# Patient Record
Sex: Male | Born: 1940 | Race: Black or African American | Hispanic: No | Marital: Single | Smoking: Former smoker
Health system: Southern US, Community
[De-identification: ages and names within clinical notes are randomized; demographics above are authoritative.]

## PROBLEM LIST (undated history)

## (undated) DIAGNOSIS — E538 Deficiency of other specified B group vitamins: Secondary | ICD-10-CM

## (undated) DIAGNOSIS — Z955 Presence of coronary angioplasty implant and graft: Secondary | ICD-10-CM

## (undated) DIAGNOSIS — K219 Gastro-esophageal reflux disease without esophagitis: Secondary | ICD-10-CM

## (undated) DIAGNOSIS — B1920 Unspecified viral hepatitis C without hepatic coma: Secondary | ICD-10-CM

## (undated) DIAGNOSIS — M109 Gout, unspecified: Secondary | ICD-10-CM

## (undated) DIAGNOSIS — I10 Essential (primary) hypertension: Secondary | ICD-10-CM

## (undated) DIAGNOSIS — K37 Unspecified appendicitis: Secondary | ICD-10-CM

## (undated) DIAGNOSIS — M1990 Unspecified osteoarthritis, unspecified site: Secondary | ICD-10-CM

## (undated) DIAGNOSIS — D649 Anemia, unspecified: Secondary | ICD-10-CM

## (undated) DIAGNOSIS — G479 Sleep disorder, unspecified: Secondary | ICD-10-CM

## (undated) DIAGNOSIS — H524 Presbyopia: Secondary | ICD-10-CM

## (undated) HISTORY — PX: OTHER SURGICAL HISTORY: SHX169

## (undated) HISTORY — PX: CHOLECYSTECTOMY: SHX55

## (undated) HISTORY — PX: CARDIAC CATHETERIZATION: SHX172

## (undated) HISTORY — PX: TOTAL KNEE ARTHROPLASTY: SHX125

## (undated) HISTORY — PX: APPENDECTOMY: SHX54

---

## 2003-02-25 ENCOUNTER — Emergency Department (HOSPITAL_COMMUNITY): Admission: AD | Admit: 2003-02-25 | Discharge: 2003-02-25 | Payer: Self-pay | Admitting: Family Medicine

## 2004-02-17 ENCOUNTER — Emergency Department (HOSPITAL_COMMUNITY): Admission: EM | Admit: 2004-02-17 | Discharge: 2004-02-17 | Payer: Self-pay | Admitting: Emergency Medicine

## 2005-02-08 ENCOUNTER — Emergency Department (HOSPITAL_COMMUNITY): Admission: EM | Admit: 2005-02-08 | Discharge: 2005-02-09 | Payer: Self-pay | Admitting: Emergency Medicine

## 2005-03-15 ENCOUNTER — Ambulatory Visit: Payer: Self-pay | Admitting: Gastroenterology

## 2005-04-04 ENCOUNTER — Ambulatory Visit: Payer: Self-pay | Admitting: Gastroenterology

## 2005-05-08 ENCOUNTER — Emergency Department (HOSPITAL_COMMUNITY): Admission: EM | Admit: 2005-05-08 | Discharge: 2005-05-09 | Payer: Self-pay | Admitting: *Deleted

## 2005-12-07 ENCOUNTER — Emergency Department (HOSPITAL_COMMUNITY): Admission: EM | Admit: 2005-12-07 | Discharge: 2005-12-07 | Payer: Self-pay | Admitting: Emergency Medicine

## 2007-04-08 ENCOUNTER — Encounter: Admission: RE | Admit: 2007-04-08 | Discharge: 2007-04-08 | Payer: Self-pay | Admitting: Family Medicine

## 2007-05-01 ENCOUNTER — Encounter: Admission: RE | Admit: 2007-05-01 | Discharge: 2007-06-19 | Payer: Self-pay | Admitting: Family Medicine

## 2008-07-17 ENCOUNTER — Emergency Department (HOSPITAL_COMMUNITY): Admission: EM | Admit: 2008-07-17 | Discharge: 2008-07-17 | Payer: Self-pay | Admitting: Emergency Medicine

## 2009-02-18 ENCOUNTER — Ambulatory Visit (HOSPITAL_COMMUNITY): Admission: RE | Admit: 2009-02-18 | Discharge: 2009-02-18 | Payer: Self-pay | Admitting: Chiropractic Medicine

## 2009-05-26 ENCOUNTER — Encounter: Admission: RE | Admit: 2009-05-26 | Discharge: 2009-05-26 | Payer: Self-pay | Admitting: Family Medicine

## 2010-03-22 ENCOUNTER — Emergency Department (HOSPITAL_COMMUNITY)
Admission: EM | Admit: 2010-03-22 | Discharge: 2010-03-22 | Payer: Self-pay | Source: Home / Self Care | Admitting: Emergency Medicine

## 2010-06-27 LAB — POCT CARDIAC MARKERS
CKMB, poc: 2.3 ng/mL (ref 1.0–8.0)
CKMB, poc: 2.4 ng/mL (ref 1.0–8.0)
Myoglobin, poc: 112 ng/mL (ref 12–200)
Myoglobin, poc: 114 ng/mL (ref 12–200)
Troponin i, poc: <0.05 ng/mL (ref 0.00–0.09)
Troponin i, poc: <0.05 ng/mL (ref 0.00–0.09)

## 2010-06-27 LAB — BASIC METABOLIC PANEL
BUN: 11 mg/dL (ref 6–23)
CO2: 27 mEq/L (ref 19–32)
Calcium: 9.2 mg/dL (ref 8.4–10.5)
Chloride: 107 mEq/L (ref 96–112)
Creatinine, Ser: 0.79 mg/dL (ref 0.4–1.5)
GFR calc Af Amer: >60 mL/min (ref >60)
GFR calc non Af Amer: >60 mL/min (ref >60)
Glucose, Bld: 110 mg/dL — ABNORMAL HIGH (ref 70–99)
Potassium: 3.8 mEq/L (ref 3.5–5.1)
Sodium: 141 mEq/L (ref 135–145)

## 2010-06-27 LAB — DIFFERENTIAL
Basophils Absolute: 0 10*3/uL (ref 0.0–0.1)
Basophils Relative: 1 % (ref 0–1)
Eosinophils Absolute: 0.1 10*3/uL (ref 0.0–0.7)
Eosinophils Relative: 1 % (ref 0–5)
Lymphocytes Relative: 46 % (ref 12–46)
Lymphs Abs: 1.7 10*3/uL (ref 0.7–4.0)
Monocytes Absolute: 0.5 10*3/uL (ref 0.1–1.0)
Monocytes Relative: 15 % — ABNORMAL HIGH (ref 3–12)
Neutro Abs: 1.3 10*3/uL — ABNORMAL LOW (ref 1.7–7.7)
Neutrophils Relative %: 37 % — ABNORMAL LOW (ref 43–77)

## 2010-06-27 LAB — PROTIME-INR
INR: 1.1 (ref 0.00–1.49)
Prothrombin Time: 14.4 seconds (ref 11.6–15.2)

## 2010-06-27 LAB — CBC
HCT: 36.8 % — ABNORMAL LOW (ref 39.0–52.0)
Hemoglobin: 13.2 g/dL (ref 13.0–17.0)
MCH: 32.8 pg (ref 26.0–34.0)
MCHC: 35.9 g/dL (ref 30.0–36.0)
MCV: 91.3 fL (ref 78.0–100.0)
Platelets: 245 10*3/uL (ref 150–400)
RBC: 4.03 MIL/uL — ABNORMAL LOW (ref 4.22–5.81)
RDW: 12.6 % (ref 11.5–15.5)
WBC: 3.6 10*3/uL — ABNORMAL LOW (ref 4.0–10.5)

## 2010-11-11 ENCOUNTER — Ambulatory Visit
Admission: RE | Admit: 2010-11-11 | Discharge: 2010-11-11 | Disposition: A | Discharge status: Home / Self Care | Payer: Medicare Other | Source: Ambulatory Visit | Attending: Family Medicine | Admitting: Family Medicine

## 2010-11-11 ENCOUNTER — Other Ambulatory Visit: Payer: Self-pay | Admitting: Family Medicine

## 2010-11-11 DIAGNOSIS — Z0000 Encounter for general adult medical examination without abnormal findings: Secondary | ICD-10-CM

## 2010-11-22 ENCOUNTER — Inpatient Hospital Stay (HOSPITAL_COMMUNITY): Admission: RE | Admit: 2010-11-22 | Payer: Medicare Other | Source: Ambulatory Visit | Admitting: Orthopedic Surgery

## 2011-09-15 ENCOUNTER — Encounter (HOSPITAL_COMMUNITY): Payer: Self-pay | Admitting: *Deleted

## 2011-09-15 ENCOUNTER — Emergency Department (HOSPITAL_COMMUNITY)
Admission: EM | Admit: 2011-09-15 | Discharge: 2011-09-15 | Disposition: A | Discharge status: Home / Self Care | Payer: Medicare Other | Attending: Emergency Medicine | Admitting: Emergency Medicine

## 2011-09-15 ENCOUNTER — Emergency Department (HOSPITAL_COMMUNITY): Payer: Medicare Other

## 2011-09-15 DIAGNOSIS — M25569 Pain in unspecified knee: Secondary | ICD-10-CM | POA: Insufficient documentation

## 2011-09-15 DIAGNOSIS — Z79899 Other long term (current) drug therapy: Secondary | ICD-10-CM | POA: Insufficient documentation

## 2011-09-15 DIAGNOSIS — Z87891 Personal history of nicotine dependence: Secondary | ICD-10-CM | POA: Insufficient documentation

## 2011-09-15 DIAGNOSIS — I10 Essential (primary) hypertension: Secondary | ICD-10-CM | POA: Insufficient documentation

## 2011-09-15 DIAGNOSIS — Z8739 Personal history of other diseases of the musculoskeletal system and connective tissue: Secondary | ICD-10-CM | POA: Insufficient documentation

## 2011-09-15 DIAGNOSIS — G8918 Other acute postprocedural pain: Secondary | ICD-10-CM | POA: Insufficient documentation

## 2011-09-15 HISTORY — DX: Presence of coronary angioplasty implant and graft: Z95.5

## 2011-09-15 HISTORY — DX: Unspecified appendicitis: K37

## 2011-09-15 HISTORY — DX: Essential (primary) hypertension: I10

## 2011-09-15 HISTORY — DX: Unspecified osteoarthritis, unspecified site: M19.90

## 2011-09-15 LAB — DIFFERENTIAL
Basophils Absolute: 0 10*3/uL (ref 0.0–0.1)
Basophils Relative: 0 % (ref 0–1)
Eosinophils Relative: 2 % (ref 0–5)
Lymphocytes Relative: 43 % (ref 12–46)
Lymphs Abs: 2.9 10*3/uL (ref 0.7–4.0)
Monocytes Absolute: 0.9 10*3/uL (ref 0.1–1.0)
Monocytes Relative: 13 % — ABNORMAL HIGH (ref 3–12)
Neutro Abs: 2.8 10*3/uL (ref 1.7–7.7)
Neutrophils Relative %: 42 % — ABNORMAL LOW (ref 43–77)

## 2011-09-15 LAB — CBC
HCT: 35.7 % — ABNORMAL LOW (ref 39.0–52.0)
Hemoglobin: 12.2 g/dL — ABNORMAL LOW (ref 13.0–17.0)
MCH: 31 pg (ref 26.0–34.0)
MCV: 90.8 fL (ref 78.0–100.0)
Platelets: 363 10*3/uL (ref 150–400)
RBC: 3.93 MIL/uL — ABNORMAL LOW (ref 4.22–5.81)
WBC: 6.8 10*3/uL (ref 4.0–10.5)

## 2011-09-15 NOTE — ED Provider Notes (Addendum)
History     CSN: [id number]  Arrival date & time 09/15/11  1600   First MD Initiated Contact with Patient 09/15/11 1845      Chief Complaint  Patient presents with  . Knee Pain    (Consider location/radiation/quality/duration/timing/severity/associated sxs/prior treatment) Patient is a 71 y.o. male presenting with knee pain. The history is provided by the patient.  Knee Pain   patient here with left knee pain after he has had recent surgery. Was discharged from the hospital week ago after having a total left knee replacement. Since that time, left knee remains painful but he has not had any fever or increased swelling or redness to his knee. Denies any myalgias or chills. Was seen at the [facility] clinic for this and does have a scheduled appointment next week. they told him to come here however for a recheck. No other complaints of  Past Medical History  Diagnosis Date  . Hypertension   . Arthritis   . History of heart artery stent   . Appendicitis     Past Surgical History  Procedure Date  . Total knee arthroplasty   . Right shoulder surgery   . Left shoulder surgery   . Appendectomy     History reviewed. No pertinent family history.  History  Substance Use Topics  . Smoking status: Former Smoker    Quit date: 09/14/2001  . Smokeless tobacco: Never Used  . Alcohol Use: 0.6 oz/week    1 Cans of beer per week      Review of Systems  All other systems reviewed and are negative.    Allergies  Review of patient's allergies indicates no known allergies.  Home Medications   Current Outpatient Rx  Name Route Sig Dispense Refill  . ALLOPURINOL 100 MG PO TABS Oral Take 100 mg by mouth daily.    Marland Kitchen AMLODIPINE BESY-BENAZEPRIL HCL 10-20 MG PO CAPS Oral Take 1 capsule by mouth daily.    Marland Kitchen LOSARTAN POTASSIUM 100 MG PO TABS Oral Take 100 mg by mouth daily.    Marland Kitchen METOPROLOL SUCCINATE ER 100 MG PO TB24 Oral Take 100 mg by mouth daily. Take with or immediately following a meal.     . SIMVASTATIN 10 MG PO TABS Oral Take 10 mg by mouth at bedtime.      BP 147/71  Pulse 85  Temp(Src) 98.3 F (36.8 C) (Oral)  Resp 18  Ht 5\' 11"  (1.803 m)  Wt 161 lb (73.029 kg)  BMI 22.45 kg/m2  SpO2 100%  Physical Exam  Nursing note and vitals reviewed. Constitutional: He is oriented to person, place, and time. He appears well-developed and well-nourished.  Non-toxic appearance. No distress.  HENT:  Head: Normocephalic and atraumatic.  Eyes: Conjunctivae, EOM and lids are normal. Pupils are equal, round, and reactive to light.  Neck: Normal range of motion. Neck supple. No tracheal deviation present. No mass present.  Cardiovascular: Normal rate, regular rhythm and normal heart sounds.  Exam reveals no gallop.   No murmur heard. Pulmonary/Chest: Effort normal and breath sounds normal. No stridor. No respiratory distress. He has no decreased breath sounds. He has no wheezes. He has no rhonchi. He has no rales.  Abdominal: Soft. Normal appearance and bowel sounds are normal. He exhibits no distension. There is no tenderness. There is no rebound and no CVA tenderness.  Musculoskeletal: Normal range of motion. He exhibits no edema and no tenderness.       Left knee with warmness to touch and  no erythema to the skin. Incision is intact without purulent drainage. He has full range of motion at the knee. No large effusion appreciated.  Neurological: He is alert and oriented to person, place, and time. He has normal strength. No cranial nerve deficit or sensory deficit. GCS eye subscore is 4. GCS verbal subscore is 5. GCS motor subscore is 6.  Skin: Skin is warm and dry. No abrasion and no rash noted.  Psychiatric: He has a normal mood and affect. His speech is normal and behavior is normal.    ED Course  Procedures (including critical care time)  Labs Reviewed - No data to display No results found.   No diagnosis found.    MDM  No concern for septic joint. Suspect that  patient's status postop changes. He has full range of motion at the knee. He is afebrile. Patient also states that the appearance of his knee has not changed from a week ago when he was discharged from the hospital. His white count is normal. He is scheduled to see his surgeon on Monday he was given return instructions for infection-        Toy Baker, MD 09/15/11 2103  Toy Baker, MD 09/15/11 2104

## 2011-09-15 NOTE — ED Notes (Signed)
Lower left knee is swollen and painful to touch. Surgical site is clean with no drainage. Reports no trauma. Right knee had surgery was 2008 and left knee had surgery many times before, but the last one was May 29th.

## 2011-09-15 NOTE — Discharge Instructions (Signed)
Followup with your doctor Monday as your schedule. Return here at once for fever, increased swelling to her knee, redness to the skin, or any other problems

## 2011-09-15 NOTE — ED Notes (Signed)
Total RT knee replacement Aug 24, 2011. Has had pain &swelling since but has been getting worse.  Told to go the the [facility] hospital in [place] or [place] but didn't have a way there so was told to go to the nearest ED for eval.

## 2011-09-15 NOTE — ED Notes (Signed)
Pt states he had left knee replacement on May 9th, states he went to see PCP for recheck and was told to come to ER due to increased pain and swelling to left knee and to r/o infection

## 2012-12-23 ENCOUNTER — Emergency Department: Payer: Self-pay | Admitting: Emergency Medicine

## 2012-12-23 LAB — CBC
HCT: 37.2 % — ABNORMAL LOW (ref 40.0–52.0)
MCH: 32.9 pg (ref 26.0–34.0)
MCHC: 34.7 g/dL (ref 32.0–36.0)
Platelet: 217 10*3/uL (ref 150–440)
RBC: 3.92 10*6/uL — ABNORMAL LOW (ref 4.40–5.90)

## 2012-12-23 LAB — BASIC METABOLIC PANEL
Anion Gap: 3 — ABNORMAL LOW (ref 7–16)
Calcium, Total: 8.9 mg/dL (ref 8.5–10.1)
Chloride: 107 mmol/L (ref 98–107)
Co2: 29 mmol/L (ref 21–32)
EGFR (African American): >60
EGFR (Non-African Amer.): >60
Osmolality: 280 (ref 275–301)
Potassium: 3.8 mmol/L (ref 3.5–5.1)

## 2013-05-12 ENCOUNTER — Encounter (HOSPITAL_COMMUNITY): Payer: Self-pay | Admitting: Emergency Medicine

## 2013-05-12 ENCOUNTER — Observation Stay (HOSPITAL_COMMUNITY)
Admission: EM | Admit: 2013-05-12 | Discharge: 2013-05-15 | Disposition: A | Discharge status: Home / Self Care | Payer: Medicare Other | Attending: Internal Medicine | Admitting: Internal Medicine

## 2013-05-12 ENCOUNTER — Emergency Department (HOSPITAL_COMMUNITY): Payer: Medicare Other

## 2013-05-12 DIAGNOSIS — Z96659 Presence of unspecified artificial knee joint: Secondary | ICD-10-CM | POA: Insufficient documentation

## 2013-05-12 DIAGNOSIS — R079 Chest pain, unspecified: Secondary | ICD-10-CM | POA: Diagnosis present

## 2013-05-12 DIAGNOSIS — Z79899 Other long term (current) drug therapy: Secondary | ICD-10-CM | POA: Insufficient documentation

## 2013-05-12 DIAGNOSIS — R5381 Other malaise: Secondary | ICD-10-CM | POA: Insufficient documentation

## 2013-05-12 DIAGNOSIS — B1920 Unspecified viral hepatitis C without hepatic coma: Secondary | ICD-10-CM | POA: Diagnosis present

## 2013-05-12 DIAGNOSIS — R5383 Other fatigue: Secondary | ICD-10-CM

## 2013-05-12 DIAGNOSIS — R740 Nonspecific elevation of levels of transaminase and lactic acid dehydrogenase [LDH]: Secondary | ICD-10-CM

## 2013-05-12 DIAGNOSIS — R7401 Elevation of levels of liver transaminase levels: Secondary | ICD-10-CM | POA: Diagnosis present

## 2013-05-12 DIAGNOSIS — R0602 Shortness of breath: Secondary | ICD-10-CM | POA: Insufficient documentation

## 2013-05-12 DIAGNOSIS — R0789 Other chest pain: Principal | ICD-10-CM | POA: Insufficient documentation

## 2013-05-12 DIAGNOSIS — R1013 Epigastric pain: Secondary | ICD-10-CM | POA: Diagnosis present

## 2013-05-12 DIAGNOSIS — E538 Deficiency of other specified B group vitamins: Secondary | ICD-10-CM | POA: Insufficient documentation

## 2013-05-12 DIAGNOSIS — D649 Anemia, unspecified: Secondary | ICD-10-CM | POA: Insufficient documentation

## 2013-05-12 DIAGNOSIS — Z9089 Acquired absence of other organs: Secondary | ICD-10-CM | POA: Insufficient documentation

## 2013-05-12 DIAGNOSIS — Z87891 Personal history of nicotine dependence: Secondary | ICD-10-CM | POA: Insufficient documentation

## 2013-05-12 DIAGNOSIS — R0601 Orthopnea: Secondary | ICD-10-CM | POA: Insufficient documentation

## 2013-05-12 DIAGNOSIS — I2510 Atherosclerotic heart disease of native coronary artery without angina pectoris: Secondary | ICD-10-CM | POA: Insufficient documentation

## 2013-05-12 DIAGNOSIS — R7402 Elevation of levels of lactic acid dehydrogenase (LDH): Secondary | ICD-10-CM | POA: Insufficient documentation

## 2013-05-12 DIAGNOSIS — R7309 Other abnormal glucose: Secondary | ICD-10-CM | POA: Insufficient documentation

## 2013-05-12 DIAGNOSIS — K219 Gastro-esophageal reflux disease without esophagitis: Secondary | ICD-10-CM | POA: Insufficient documentation

## 2013-05-12 DIAGNOSIS — K8020 Calculus of gallbladder without cholecystitis without obstruction: Secondary | ICD-10-CM | POA: Diagnosis present

## 2013-05-12 DIAGNOSIS — M129 Arthropathy, unspecified: Secondary | ICD-10-CM | POA: Insufficient documentation

## 2013-05-12 DIAGNOSIS — K859 Acute pancreatitis without necrosis or infection, unspecified: Secondary | ICD-10-CM | POA: Diagnosis present

## 2013-05-12 DIAGNOSIS — M109 Gout, unspecified: Secondary | ICD-10-CM | POA: Insufficient documentation

## 2013-05-12 DIAGNOSIS — I10 Essential (primary) hypertension: Secondary | ICD-10-CM | POA: Diagnosis present

## 2013-05-12 HISTORY — DX: Sleep disorder, unspecified: G47.9

## 2013-05-12 HISTORY — DX: Gastro-esophageal reflux disease without esophagitis: K21.9

## 2013-05-12 HISTORY — DX: Deficiency of other specified B group vitamins: E53.8

## 2013-05-12 HISTORY — DX: Unspecified viral hepatitis C without hepatic coma: B19.20

## 2013-05-12 HISTORY — DX: Presbyopia: H52.4

## 2013-05-12 HISTORY — DX: Anemia, unspecified: D64.9

## 2013-05-12 LAB — RETICULOCYTES
RBC.: 3.96 MIL/uL — ABNORMAL LOW (ref 4.22–5.81)
RETIC COUNT ABSOLUTE: 59.4 10*3/uL (ref 19.0–186.0)
Retic Ct Pct: 1.5 % (ref 0.4–3.1)

## 2013-05-12 LAB — BASIC METABOLIC PANEL
BUN: 13 mg/dL (ref 6–23)
CALCIUM: 9.3 mg/dL (ref 8.4–10.5)
CO2: 27 meq/L (ref 19–32)
CREATININE: 0.81 mg/dL (ref 0.50–1.35)
Chloride: 104 mEq/L (ref 96–112)
GFR calc Af Amer: >90 mL/min (ref >90)
GFR, EST NON AFRICAN AMERICAN: 87 mL/min — AB (ref >90)
Glucose, Bld: 119 mg/dL — ABNORMAL HIGH (ref 70–99)
Potassium: 4.1 mEq/L (ref 3.7–5.3)
SODIUM: 141 meq/L (ref 137–147)

## 2013-05-12 LAB — D-DIMER, QUANTITATIVE (NOT AT ARMC): D DIMER QUANT: 0.55 ug{FEU}/mL — AB (ref 0.00–0.48)

## 2013-05-12 LAB — MAGNESIUM: MAGNESIUM: 2.1 mg/dL (ref 1.5–2.5)

## 2013-05-12 LAB — HEPATIC FUNCTION PANEL
ALK PHOS: 110 U/L (ref 39–117)
ALT: 47 U/L (ref 0–53)
AST: 56 U/L — ABNORMAL HIGH (ref 0–37)
Albumin: 3.5 g/dL (ref 3.5–5.2)
Bilirubin, Direct: <0.2 mg/dL (ref 0.0–0.3)
TOTAL PROTEIN: 8.2 g/dL (ref 6.0–8.3)
Total Bilirubin: 0.5 mg/dL (ref 0.3–1.2)

## 2013-05-12 LAB — PROTIME-INR
INR: 1.06 (ref 0.00–1.49)
Prothrombin Time: 13.6 seconds (ref 11.6–15.2)

## 2013-05-12 LAB — CBC
HCT: 36.7 % — ABNORMAL LOW (ref 39.0–52.0)
HEMOGLOBIN: 12.9 g/dL — AB (ref 13.0–17.0)
MCH: 32.6 pg (ref 26.0–34.0)
MCHC: 35.1 g/dL (ref 30.0–36.0)
MCV: 92.7 fL (ref 78.0–100.0)
PLATELETS: 214 10*3/uL (ref 150–400)
RBC: 3.96 MIL/uL — ABNORMAL LOW (ref 4.22–5.81)
RDW: 13.8 % (ref 11.5–15.5)
WBC: 4.4 10*3/uL (ref 4.0–10.5)

## 2013-05-12 LAB — POCT I-STAT TROPONIN I: TROPONIN I, POC: 0.01 ng/mL (ref 0.00–0.08)

## 2013-05-12 LAB — CBC WITH DIFFERENTIAL/PLATELET
BASOS ABS: 0 10*3/uL (ref 0.0–0.1)
BASOS PCT: 1 % (ref 0–1)
EOS ABS: 0.1 10*3/uL (ref 0.0–0.7)
Eosinophils Relative: 2 % (ref 0–5)
HEMATOCRIT: 36.8 % — AB (ref 39.0–52.0)
Hemoglobin: 12.7 g/dL — ABNORMAL LOW (ref 13.0–17.0)
LYMPHS PCT: 49 % — AB (ref 12–46)
Lymphs Abs: 2.2 10*3/uL (ref 0.7–4.0)
MCH: 31.8 pg (ref 26.0–34.0)
MCHC: 34.5 g/dL (ref 30.0–36.0)
MCV: 92.2 fL (ref 78.0–100.0)
MONO ABS: 0.6 10*3/uL (ref 0.1–1.0)
Monocytes Relative: 13 % — ABNORMAL HIGH (ref 3–12)
Neutro Abs: 1.6 10*3/uL — ABNORMAL LOW (ref 1.7–7.7)
Neutrophils Relative %: 35 % — ABNORMAL LOW (ref 43–77)
PLATELETS: 215 10*3/uL (ref 150–400)
RBC: 3.99 MIL/uL — ABNORMAL LOW (ref 4.22–5.81)
RDW: 13.7 % (ref 11.5–15.5)
WBC: 4.5 10*3/uL (ref 4.0–10.5)

## 2013-05-12 LAB — TROPONIN I: Troponin I: <0.3 ng/mL (ref <0.30)

## 2013-05-12 LAB — LIPASE, BLOOD: LIPASE: 34 U/L (ref 11–59)

## 2013-05-12 MED ORDER — ACETAMINOPHEN 650 MG RE SUPP: 650.0000 mg | Freq: Four times a day (QID) | RECTAL | Status: DC | PRN

## 2013-05-12 MED ORDER — ONDANSETRON HCL 4 MG PO TABS: 4.0000 mg | ORAL_TABLET | Freq: Four times a day (QID) | ORAL | Status: DC | PRN

## 2013-05-12 MED ORDER — ACETAMINOPHEN 325 MG PO TABS: 650.0000 mg | ORAL_TABLET | Freq: Four times a day (QID) | ORAL | Status: DC | PRN

## 2013-05-12 MED ORDER — SODIUM CHLORIDE 0.9 % IJ SOLN: 3.0000 mL | INTRAMUSCULAR | Status: DC | PRN

## 2013-05-12 MED ORDER — SODIUM CHLORIDE 0.9 % IJ SOLN
3.0000 mL | Freq: Two times a day (BID) | INTRAMUSCULAR | Status: DC
  Administered 2013-05-15: 3 mL via INTRAVENOUS

## 2013-05-12 MED ORDER — SERTRALINE HCL 25 MG PO TABS
25.0000 mg | ORAL_TABLET | Freq: Every day | ORAL | Status: DC
  Administered 2013-05-12 – 2013-05-15 (×4): 25 mg via ORAL
  Filled 2013-05-12 (×4): qty 1

## 2013-05-12 MED ORDER — NITROGLYCERIN 0.4 MG SL SUBL: 0.4000 mg | SUBLINGUAL_TABLET | SUBLINGUAL | Status: DC | PRN

## 2013-05-12 MED ORDER — ASPIRIN EC 325 MG PO TBEC
325.0000 mg | DELAYED_RELEASE_TABLET | Freq: Every day | ORAL | Status: DC
  Administered 2013-05-12 – 2013-05-15 (×4): 325 mg via ORAL
  Filled 2013-05-12 (×4): qty 1

## 2013-05-12 MED ORDER — SODIUM CHLORIDE 0.9 % IJ SOLN
3.0000 mL | Freq: Two times a day (BID) | INTRAMUSCULAR | Status: DC
  Administered 2013-05-12 – 2013-05-14 (×5): 3 mL via INTRAVENOUS

## 2013-05-12 MED ORDER — METOPROLOL SUCCINATE ER 100 MG PO TB24
100.0000 mg | ORAL_TABLET | Freq: Two times a day (BID) | ORAL | Status: DC
  Administered 2013-05-12 – 2013-05-15 (×3): 100 mg via ORAL
  Filled 2013-05-12 (×7): qty 1

## 2013-05-12 MED ORDER — AMLODIPINE BESY-BENAZEPRIL HCL 10-20 MG PO CAPS: 1.0000 | ORAL_CAPSULE | Freq: Every day | ORAL | Status: DC

## 2013-05-12 MED ORDER — SODIUM CHLORIDE 0.9 % IV SOLN: 250.0000 mL | INTRAVENOUS | Status: DC | PRN

## 2013-05-12 MED ORDER — LEVALBUTEROL HCL 0.63 MG/3ML IN NEBU: 0.6300 mg | INHALATION_SOLUTION | Freq: Four times a day (QID) | RESPIRATORY_TRACT | Status: DC | PRN

## 2013-05-12 MED ORDER — ONDANSETRON HCL 4 MG/2ML IJ SOLN: 4.0000 mg | Freq: Four times a day (QID) | INTRAMUSCULAR | Status: DC | PRN

## 2013-05-12 MED ORDER — ASPIRIN 81 MG PO CHEW
324.0000 mg | CHEWABLE_TABLET | Freq: Once | ORAL | Status: AC
  Administered 2013-05-12: 324 mg via ORAL
  Filled 2013-05-12: qty 4

## 2013-05-12 MED ORDER — ALLOPURINOL 100 MG PO TABS
100.0000 mg | ORAL_TABLET | Freq: Every day | ORAL | Status: DC
  Administered 2013-05-12 – 2013-05-15 (×4): 100 mg via ORAL
  Filled 2013-05-12 (×4): qty 1

## 2013-05-12 MED ORDER — PANTOPRAZOLE SODIUM 40 MG PO TBEC
40.0000 mg | DELAYED_RELEASE_TABLET | Freq: Every day | ORAL | Status: DC
  Administered 2013-05-12 – 2013-05-15 (×4): 40 mg via ORAL
  Filled 2013-05-12 (×4): qty 1

## 2013-05-12 MED ORDER — AMLODIPINE BESYLATE 10 MG PO TABS
10.0000 mg | ORAL_TABLET | Freq: Every day | ORAL | Status: DC
  Administered 2013-05-12 – 2013-05-15 (×4): 10 mg via ORAL
  Filled 2013-05-12 (×4): qty 1

## 2013-05-12 MED ORDER — BENAZEPRIL HCL 20 MG PO TABS
20.0000 mg | ORAL_TABLET | Freq: Every day | ORAL | Status: DC
  Administered 2013-05-12 – 2013-05-15 (×4): 20 mg via ORAL
  Filled 2013-05-12 (×4): qty 1

## 2013-05-12 MED ORDER — ENOXAPARIN SODIUM 40 MG/0.4ML ~~LOC~~ SOLN
40.0000 mg | SUBCUTANEOUS | Status: DC
  Administered 2013-05-12 – 2013-05-14 (×3): 40 mg via SUBCUTANEOUS
  Filled 2013-05-12 (×4): qty 0.4

## 2013-05-12 NOTE — H&P (Signed)
[facility] History and Physical  ISAIC SYLER ZOX:[id number] DOB: 1941/03/23 DOA: 05/12/2013  Referring physician:   PCP: Burtis Junes, MD  Chest pain  HPI:  73 year old male with a history of coronary artery disease, status post stent placement in October 2010, with his cardiologist at the [facility], who presents with left-sided chest pain, started 2 days ago, worsened with movement, denies any correlation to deep inspiration. He denies any recent history of travel, any history of DVT/PE, does complain of mild shortness of breath orthopnea. He has not had a stress test since his stent placement, and doses compliance with aspirin, sometimes misses a few doses here and there. No history of prior myocardial infarction.       Review of Systems: negative for the following  Constitutional: As in history of present illness HEENT: Denies photophobia, eye pain, redness, hearing loss, ear pain, congestion, sore throat, rhinorrhea, sneezing, mouth sores, trouble swallowing, neck pain, neck stiffness and tinnitus.  Respiratory: Denies SOB, DOE, cough, chest tightness, and wheezing.  Cardiovascular: Positive for chest pain, palpitations and leg swelling.  Gastrointestinal: Denies nausea, vomiting, abdominal pain, diarrhea, constipation, blood in stool and abdominal distention.  Genitourinary: Denies dysuria, urgency, frequency, hematuria, flank pain and difficulty urinating.  Musculoskeletal: Denies myalgias, back pain, joint swelling, arthralgias and gait problem.  Skin: Denies pallor, rash and wound.  Neurological: Denies dizziness, seizures, syncope, weakness, light-headedness, numbness and headaches.  Hematological: Denies adenopathy. Easy bruising, personal or family bleeding history  Psychiatric/Behavioral: Denies suicidal ideation, mood changes, confusion, nervousness, sleep disturbance and agitation       Past Medical History  Diagnosis Date  . Hypertension   .  Arthritis   . History of heart artery stent   . Appendicitis      Past Surgical History  Procedure Laterality Date  . Total knee arthroplasty    . Right shoulder surgery    . Left shoulder surgery    . Appendectomy        Social History:  reports that he quit smoking about 11 years ago. He has never used smokeless tobacco. He reports that he drinks about 0.6 ounces of alcohol per week. He reports that he does not use illicit drugs.    No Known Allergies  No family history on file.   Prior to Admission medications   Medication Sig Start Date End Date Taking? Authorizing Provider  ACETAMINOPHEN PO Take 1 tablet by mouth as needed (pain.).    Yes Historical Provider, MD  allopurinol (ZYLOPRIM) 100 MG tablet Take 100 mg by mouth daily.   Yes Historical Provider, MD  amLODipine-benazepril (LOTREL) 10-20 MG per capsule Take 1 capsule by mouth daily.   Yes Historical Provider, MD  ATORVASTATIN CALCIUM PO Take 1 tablet by mouth at bedtime.   Yes Historical Provider, MD  GABAPENTIN PO Take 1 capsule by mouth 2 (two) times daily.    Yes Historical Provider, MD  losartan (COZAAR) 100 MG tablet Take 100 mg by mouth daily.   Yes Historical Provider, MD  metoprolol succinate (TOPROL-XL) 100 MG 24 hr tablet Take 100 mg by mouth 2 (two) times daily. Take with or immediately following a meal.   Yes Historical Provider, MD  NAPROXEN PO Take 1 tablet by mouth daily as needed (pain.).    Yes Historical Provider, MD  SERTRALINE HCL PO Take by mouth daily.    Yes Historical Provider, MD  vitamin B-12 (CYANOCOBALAMIN) 1000 MCG tablet Take 1,000 mcg by mouth daily.   Yes  Historical Provider, MD     Physical Exam: Filed Vitals:   05/12/13 1420 05/12/13 1731  BP: 106/62 128/72  Pulse: 56 56  Temp: 97.9 F (36.6 C)   TempSrc: Oral   Resp: 16 16  SpO2: 98% 98%     Constitutional: Vital signs reviewed. Patient is a well-developed and well-nourished in no acute distress and cooperative with  exam. Alert and oriented x3.  Head: Normocephalic and atraumatic  Ear: TM normal bilaterally  Mouth: no erythema or exudates, MMM  Eyes: PERRL, EOMI, conjunctivae normal, No scleral icterus.  Neck: Supple, Trachea midline normal ROM, No JVD, mass, thyromegaly, or carotid bruit present.  Cardiovascular: RRR, S1 normal, S2 normal, no MRG, pulses symmetric and intact bilaterally  Pulmonary/Chest: CTAB, no wheezes, rales, or rhonchi  Abdominal: Soft. Non-tender, non-distended, bowel sounds are normal, no masses, organomegaly, or guarding present.  GU: no CVA tenderness Musculoskeletal: No joint deformities, erythema, or stiffness, ROM full and no nontender Ext: no edema and no cyanosis, pulses palpable bilaterally (DP and PT)  Hematology: no cervical, inginal, or axillary adenopathy.  Neurological: A&O x3, Strenght is normal and symmetric bilaterally, cranial nerve II-XII are grossly intact, no focal motor deficit, sensory intact to light touch bilaterally.  Skin: Warm, dry and intact. No rash, cyanosis, or clubbing.  Psychiatric: Normal mood and affect. speech and behavior is normal. Judgment and thought content normal. Cognition and memory are normal.       Labs on Admission:    Basic Metabolic Panel:  Recent Labs Lab 05/12/13 1433  NA 141  K 4.1  CL 104  CO2 27  GLUCOSE 119*  BUN 13  CREATININE 0.81  CALCIUM 9.3   Liver Function Tests: No results found for this basename: AST, ALT, ALKPHOS, BILITOT, PROT, ALBUMIN,  in the last 168 hours No results found for this basename: LIPASE, AMYLASE,  in the last 168 hours No results found for this basename: AMMONIA,  in the last 168 hours CBC:  Recent Labs Lab 05/12/13 1433  WBC 4.5  NEUTROABS 1.6*  HGB 12.7*  HCT 36.8*  MCV 92.2  PLT 215   Cardiac Enzymes: No results found for this basename: CKTOTAL, CKMB, CKMBINDEX, TROPONINI,  in the last 168 hours  BNP (last 3 results) No results found for this basename: PROBNP,  in  the last 8760 hours    CBG: No results found for this basename: GLUCAP,  in the last 168 hours  Radiological Exams on Admission: No results found.  EKG: Independently reviewed. Sinus rhythm   Assessment/Plan Active Problems:   Chest pain   Chest pain Chest x-ray pending He had similar symptoms in 2011, workup was negative He will be admitted to telemetry We'll cycle cardiac enzymes, aspirin, oxygen, nitro paste 2-D echo, d-dimer, if abnormal the patient will have a CTA of the chest If workup negative, patient will be discharged home in a.m. He needs to follow up with his cardiologist at the [facility] to get an outpatient stress test   Anemia We'll start the patient on a PPI On chronic aspirin He will need age  appropriate screening after discharge   Hypertension Continue beta blocker, ACE inhibitor Hold Cozaar and blood pressure soft    Code Status:   full Family Communication: bedside Disposition Plan: Observation  Time spent: 70 mins   [facility] Pager [phone]  If 7PM-7AM, please contact night-coverage [email or web address] Password [facility] [place] 05/12/2013, 5:53 PM

## 2013-05-12 NOTE — ED Notes (Signed)
Pt c/o increased chest pain with movement and deep breathing

## 2013-05-12 NOTE — ED Notes (Signed)
Pt c/o chest pain off and on since Thursday; shortness of breath at times

## 2013-05-12 NOTE — ED Provider Notes (Signed)
Medical screening examination/treatment/procedure(s) were conducted as a shared visit with non-physician practitioner(s) and myself.  I personally evaluated the patient during the encounter.  EKG Interpretation    Date/Time:  Monday May 12 2013 14:17:43 EST Ventricular Rate:  55 PR Interval:  126 QRS Duration: 73 QT Interval:  422 QTC Calculation: 404 R Axis:   76 Text Interpretation:  Sinus rhythm Left atrial enlargement LVH with secondary repolarization abnormality Anterior Q waves, possibly due to LVH Confirmed by Ethelda Chick  MD, SAM ([id number]) on 05/12/2013 2:30:07 PM            Pt is a 73 y.o. M with history of hypertension, CAD status post stent who presents to the emergency department with intermittent chest pain shortness of breath. He is hemodynamically stable. Troponin is negative. EKG shows new lateral T-wave flattening and slight inversions. Will admit.  Layla Maw Demarie Hyneman, DO 05/12/13 [phone]

## 2013-05-12 NOTE — ED Provider Notes (Signed)
CSN: [id number]     Arrival date & time 05/12/13  1404 History   First MD Initiated Contact with Patient 05/12/13 1656     Chief Complaint  Patient presents with  . Chest Pain   (Consider location/radiation/quality/duration/timing/severity/associated sxs/prior Treatment) HPI Comments: Patient is a 73 year old male past medical history significant for hypertension, arthritis, former tobacco user, coronary artery stent placement 4 years ago presented to the emergency department for 2 days of left-sided chest pain without radiation. Patient states his pain is worsened with movement, inspiration. He denies any alleviating factors, although he has not tried any medications or symptomatic measures. He denies any history of similar chest pain. He denies any associated nausea, vomiting, diaphoresis, abdominal pain, cough, shortness of breath.  Patient is a 73 y.o. male presenting with chest pain.  Chest Pain Associated symptoms: no abdominal pain, no cough, no fever, no nausea, no shortness of breath and not vomiting     Past Medical History  Diagnosis Date  . Hypertension   . Arthritis   . History of heart artery stent   . Appendicitis    Past Surgical History  Procedure Laterality Date  . Total knee arthroplasty    . Right shoulder surgery    . Left shoulder surgery    . Appendectomy     History reviewed. No pertinent family history. History  Substance Use Topics  . Smoking status: Former Smoker    Quit date: 09/14/2001  . Smokeless tobacco: Never Used  . Alcohol Use: 0.0 oz/week     Comment: approx 2 beers two to three times per week    Review of Systems  Constitutional: Negative for fever and chills.  Respiratory: Negative for cough and shortness of breath.   Cardiovascular: Positive for chest pain.  Gastrointestinal: Negative for nausea, vomiting and abdominal pain.  All other systems reviewed and are negative.    Allergies  Review of patient's allergies indicates no  known allergies.  Home Medications   No current outpatient prescriptions on file. BP 150/75  Pulse 62  Temp(Src) 98 F (36.7 C) (Oral)  Resp 20  Ht 5\' 11"  (1.803 m)  Wt 165 lb (74.844 kg)  BMI 23.02 kg/m2  SpO2 99% Physical Exam  Constitutional: He is oriented to person, place, and time. He appears well-developed and well-nourished. No distress.  HENT:  Head: Normocephalic and atraumatic.  Right Ear: External ear normal.  Left Ear: External ear normal.  Nose: Nose normal.  Mouth/Throat: No oropharyngeal exudate.  Eyes: Conjunctivae are normal.  Neck: Neck supple.  Cardiovascular: Normal rate, regular rhythm and normal heart sounds.   No carotid bruits appreciated.  Pulmonary/Chest: Effort normal and breath sounds normal. No stridor. No respiratory distress. He exhibits no tenderness.  Abdominal: Soft.  Neurological: He is alert and oriented to person, place, and time.  Skin: Skin is warm and dry. He is not diaphoretic.    ED Course  Procedures (including critical care time) Medications  enoxaparin (LOVENOX) injection 40 mg (40 mg Subcutaneous Given 05/12/13 2108)  sodium chloride 0.9 % injection 3 mL (0 mLs Intravenous Duplicate 05/12/13 2045)  sodium chloride 0.9 % injection 3 mL (not administered)  sodium chloride 0.9 % injection 3 mL (not administered)  0.9 %  sodium chloride infusion (not administered)  acetaminophen (TYLENOL) tablet 650 mg (not administered)    Or  acetaminophen (TYLENOL) suppository 650 mg (not administered)  ondansetron (ZOFRAN) tablet 4 mg (not administered)    Or  ondansetron (ZOFRAN) injection 4 mg (  not administered)  aspirin EC tablet 325 mg (325 mg Oral Given 05/12/13 2108)  levalbuterol (XOPENEX) nebulizer solution 0.63 mg (not administered)  allopurinol (ZYLOPRIM) tablet 100 mg (100 mg Oral Given 05/12/13 2108)  metoprolol succinate (TOPROL-XL) 24 hr tablet 100 mg (100 mg Oral Given 05/12/13 2108)  sertraline (ZOLOFT) tablet 25 mg (25 mg  Oral Given 05/12/13 1819)  amLODipine (NORVASC) tablet 10 mg (10 mg Oral Given 05/12/13 2108)    And  benazepril (LOTENSIN) tablet 20 mg (20 mg Oral Given 05/12/13 2108)  nitroGLYCERIN (NITROSTAT) SL tablet 0.4 mg (not administered)  pantoprazole (PROTONIX) EC tablet 40 mg (40 mg Oral Given 05/12/13 1819)  aspirin chewable tablet 324 mg (324 mg Oral Given 05/12/13 1728)    Labs Review Labs Reviewed  CBC WITH DIFFERENTIAL - Abnormal; Notable for the following:    RBC 3.99 (*)    Hemoglobin 12.7 (*)    HCT 36.8 (*)    Neutrophils Relative % 35 (*)    Neutro Abs 1.6 (*)    Lymphocytes Relative 49 (*)    Monocytes Relative 13 (*)    All other components within normal limits  BASIC METABOLIC PANEL - Abnormal; Notable for the following:    Glucose, Bld 119 (*)    GFR calc non Af Amer 87 (*)    All other components within normal limits  CBC - Abnormal; Notable for the following:    RBC 3.96 (*)    Hemoglobin 12.9 (*)    HCT 36.7 (*)    All other components within normal limits  HEPATIC FUNCTION PANEL - Abnormal; Notable for the following:    AST 56 (*)    All other components within normal limits  D-DIMER, QUANTITATIVE - Abnormal; Notable for the following:    D-Dimer, Quant 0.55 (*)    All other components within normal limits  RETICULOCYTES - Abnormal; Notable for the following:    RBC. 3.96 (*)    All other components within normal limits  MAGNESIUM  PROTIME-INR  TROPONIN I  LIPASE, BLOOD  TSH  LIPID PANEL  HEMOGLOBIN A1C  TROPONIN I  TROPONIN I  COMPREHENSIVE METABOLIC PANEL  CBC  VITAMIN B12  FOLATE  IRON AND TIBC  FERRITIN  POCT I-STAT TROPONIN I   Imaging Review Dg Chest 2 View  05/12/2013   CLINICAL DATA:  Chest pain.  EXAM: CHEST  2 VIEW  COMPARISON:  PA and lateral chest 11/11/2010.  FINDINGS: The lungs are clear. Heart size is normal. No pneumothorax or pleural fluid. Postoperative change right shoulder is noted.  IMPRESSION: No acute disease.   Electronically  Signed   By: Drusilla Kanner M.D.   On: 05/12/2013 18:21    EKG Interpretation    Date/Time:  Monday May 12 2013 14:17:43 EST Ventricular Rate:  55 PR Interval:  126 QRS Duration: 73 QT Interval:  422 QTC Calculation: 404 R Axis:   76 Text Interpretation:  Sinus rhythm Left atrial enlargement LVH with secondary repolarization abnormality Anterior Q waves, possibly due to LVH Confirmed by [email or web address]  MD, SAM ([id number]) on 05/12/2013 2:30:07 PM            MDM   1. Chest pain     Filed Vitals:   05/12/13 1905  BP: 150/75  Pulse: 62  Temp: 98 F (36.7 C)  Resp: 20    Afebrile, NAD, non-toxic appearing, AAOx4.   Concern for cardiac etiology of Chest Pain. Hospitalist has been consulted and will see patient in  the ED for likely admit. Pt does not meet criteria for CP protocol and a further evaluation is recommended. Pt has been re-evaluated prior to consult and VSS, NAD, heart RRR, pain 0/10, lungs CTAB. No acute abnormalities found on EKG and first round of cardiac enzymes negative. This case was discussed with Dr. Elesa Massed who has seen the patient and agrees with plan to admit.     Jeannetta Ellis, PA-C 05/12/13 2330

## 2013-05-12 NOTE — ED Notes (Signed)
Bed: UP10 Expected date:  Expected time:  Means of arrival:  Comments: Room 3

## 2013-05-13 ENCOUNTER — Observation Stay (HOSPITAL_COMMUNITY): Payer: Medicare Other

## 2013-05-13 ENCOUNTER — Encounter (HOSPITAL_COMMUNITY): Payer: Self-pay | Admitting: Radiology

## 2013-05-13 DIAGNOSIS — I10 Essential (primary) hypertension: Secondary | ICD-10-CM

## 2013-05-13 DIAGNOSIS — R7401 Elevation of levels of liver transaminase levels: Secondary | ICD-10-CM | POA: Diagnosis present

## 2013-05-13 DIAGNOSIS — R7402 Elevation of levels of lactic acid dehydrogenase (LDH): Secondary | ICD-10-CM

## 2013-05-13 DIAGNOSIS — R740 Nonspecific elevation of levels of transaminase and lactic acid dehydrogenase [LDH]: Secondary | ICD-10-CM

## 2013-05-13 DIAGNOSIS — I369 Nonrheumatic tricuspid valve disorder, unspecified: Secondary | ICD-10-CM

## 2013-05-13 DIAGNOSIS — K859 Acute pancreatitis without necrosis or infection, unspecified: Secondary | ICD-10-CM

## 2013-05-13 LAB — COMPREHENSIVE METABOLIC PANEL
ALT: 41 U/L (ref 0–53)
AST: 50 U/L — AB (ref 0–37)
Albumin: 3.1 g/dL — ABNORMAL LOW (ref 3.5–5.2)
Alkaline Phosphatase: 101 U/L (ref 39–117)
BUN: 11 mg/dL (ref 6–23)
CALCIUM: 9 mg/dL (ref 8.4–10.5)
CO2: 29 meq/L (ref 19–32)
Chloride: 105 mEq/L (ref 96–112)
Creatinine, Ser: 0.8 mg/dL (ref 0.50–1.35)
GFR, EST NON AFRICAN AMERICAN: 87 mL/min — AB (ref >90)
GLUCOSE: 116 mg/dL — AB (ref 70–99)
Potassium: 4.2 mEq/L (ref 3.7–5.3)
Sodium: 142 mEq/L (ref 137–147)
Total Bilirubin: 0.3 mg/dL (ref 0.3–1.2)
Total Protein: 7.3 g/dL (ref 6.0–8.3)

## 2013-05-13 LAB — IRON AND TIBC
IRON: 94 ug/dL (ref 42–135)
Saturation Ratios: 29 % (ref 20–55)
TIBC: 320 ug/dL (ref 215–435)
UIBC: 226 ug/dL (ref 125–400)

## 2013-05-13 LAB — RAPID URINE DRUG SCREEN, HOSP PERFORMED
Amphetamines: NOT DETECTED
BARBITURATES: NOT DETECTED
Benzodiazepines: NOT DETECTED
Cocaine: NOT DETECTED
OPIATES: NOT DETECTED
TETRAHYDROCANNABINOL: NOT DETECTED

## 2013-05-13 LAB — LIPASE, BLOOD: Lipase: 297 U/L — ABNORMAL HIGH (ref 11–59)

## 2013-05-13 LAB — CBC
HCT: 36.6 % — ABNORMAL LOW (ref 39.0–52.0)
Hemoglobin: 13.1 g/dL (ref 13.0–17.0)
MCH: 33 pg (ref 26.0–34.0)
MCHC: 35.8 g/dL (ref 30.0–36.0)
MCV: 92.2 fL (ref 78.0–100.0)
Platelets: 208 10*3/uL (ref 150–400)
RBC: 3.97 MIL/uL — AB (ref 4.22–5.81)
RDW: 13.8 % (ref 11.5–15.5)
WBC: 5.6 10*3/uL (ref 4.0–10.5)

## 2013-05-13 LAB — LIPID PANEL
CHOL/HDL RATIO: 4.1 ratio
Cholesterol: 156 mg/dL (ref 0–200)
HDL: 38 mg/dL — ABNORMAL LOW (ref >39)
LDL Cholesterol: 89 mg/dL (ref 0–99)
Triglycerides: 147 mg/dL (ref <150)
VLDL: 29 mg/dL (ref 0–40)

## 2013-05-13 LAB — FERRITIN: Ferritin: 484 ng/mL — ABNORMAL HIGH (ref 22–322)

## 2013-05-13 LAB — TROPONIN I: Troponin I: <0.3 ng/mL (ref <0.30)

## 2013-05-13 LAB — VITAMIN B12: Vitamin B-12: 872 pg/mL (ref 211–911)

## 2013-05-13 LAB — HEMOGLOBIN A1C
HEMOGLOBIN A1C: 6.1 % — AB (ref <5.7)
MEAN PLASMA GLUCOSE: 128 mg/dL — AB (ref <117)

## 2013-05-13 LAB — FOLATE

## 2013-05-13 LAB — TSH: TSH: 2.052 u[IU]/mL (ref 0.350–4.500)

## 2013-05-13 MED ORDER — ZOLPIDEM TARTRATE 5 MG PO TABS
5.0000 mg | ORAL_TABLET | Freq: Every evening | ORAL | Status: DC | PRN
  Administered 2013-05-13 – 2013-05-14 (×2): 5 mg via ORAL
  Filled 2013-05-13 (×2): qty 1

## 2013-05-13 MED ORDER — IOHEXOL 350 MG/ML SOLN
100.0000 mL | Freq: Once | INTRAVENOUS | Status: AC | PRN
  Administered 2013-05-13: 100 mL via INTRAVENOUS

## 2013-05-13 MED ORDER — DOCUSATE SODIUM 100 MG PO CAPS
200.0000 mg | ORAL_CAPSULE | Freq: Every day | ORAL | Status: DC
  Administered 2013-05-13 – 2013-05-14 (×2): 200 mg via ORAL
  Filled 2013-05-13 (×3): qty 2

## 2013-05-13 MED ORDER — ZOLPIDEM TARTRATE 5 MG PO TABS
5.0000 mg | ORAL_TABLET | Freq: Once | ORAL | Status: AC
  Administered 2013-05-13: 5 mg via ORAL
  Filled 2013-05-13: qty 1

## 2013-05-13 NOTE — Progress Notes (Signed)
Echocardiogram 2D Echocardiogram has been performed.  Derrick Dean 05/13/2013, 9:40 AM

## 2013-05-13 NOTE — Progress Notes (Addendum)
[facility] PROGRESS NOTE  Derrick Dean WUJ:[id number] DOB: 09/04/1940 DOA: 05/12/2013 PCP: Burtis Junes, MD  Interim history 73 year old male with a history of coronary artery disease, status post stent placement in October 2010, with his cardiologist at the [facility], who presents with left-sided chest pain, started 2 days ago, worsened with movement, and occasionally deep inspiration. Troponins are negative x3. CT scan of the chest was negative for pulmonary embolus, but showed pancreatic stranding with peripancreatic lymphadenopathy. The patient also had some substernal/epigastric discomfort. Lipase was ordered as a result of CT findings and clinical findings. Lipase was noted to be 297--this was an add-on test to blood work on evening of 05/12/13.  Abdominal U/S was ordered but pt already ate; therefore had to be postponed until 05/14/13.    Assessment/Plan: Pancreatitis -Clinically, patient is presenting with minimal symptoms -he is tolerating diet -abdominal US -triglycerides 147 -UDS -may need CT abdomen and pelvis Atypical chest pain -Likely musculoskeletal -Troponins negative x3 -Echocardiogram--EF 55-60%  Impaired glucose tolerance  -Hemoglobin A1c--6.1  -Lifestyle modification  -TSH 2.052 Hypertension  -Continue metoprolol succinate, amlodipine, benazepril    Family Communication:   Pt at beside Disposition Plan:   Home when medically stable    Procedures/Studies: Dg Chest 2 View  05/12/2013   CLINICAL DATA:  Chest pain.  EXAM: CHEST  2 VIEW  COMPARISON:  PA and lateral chest 11/11/2010.  FINDINGS: The lungs are clear. Heart size is normal. No pneumothorax or pleural fluid. Postoperative change right shoulder is noted.  IMPRESSION: No acute disease.   Electronically Signed   By: Drusilla Kanner M.D.   On: 05/12/2013 18:21   Ct Angio Chest Pe W/cm &/or Wo Cm  05/13/2013   CLINICAL DATA:  Elevated D-dimer. Left upper chest pain. Shortness of breath.   EXAM: CT ANGIOGRAPHY CHEST WITH CONTRAST  TECHNIQUE: Multidetector CT imaging of the chest was performed using the standard protocol during bolus administration of intravenous contrast. Multiplanar CT image reconstructions including MIPs were obtained to evaluate the vascular anatomy.  CONTRAST:  OMNIPAQUE IOHEXOL 350 MG/ML SOLN  COMPARISON:  DG CHEST 2 VIEW dated 05/12/2013; DG CHEST 2 VIEW dated 11/11/2010; DG CHEST 2 VIEW dated 03/22/2010  FINDINGS: No filling defect is identified in the pulmonary arterial tree to suggest pulmonary embolus. Mild aortic atherosclerotic intimal thickening and calcification noted. coronary artery calcification is observed.  There is abnormal edema and stranding in the left upper quadrant along with multiple gallstones measuring up to 2.5 cm in diameter, and mild peripancreatic adenopathy. Suspected right kidney upper pole renal calculus.  Paraseptal emphysema. Left apical scarring. Mild peripheral coarse interstitial accentuation at the lung bases favoring fibrosis.  Severe degenerative left glenohumeral arthropathy.  Prominent spurring anterior to the cervical spinal column at the C6-7 level. Chronically fragmented spurs from the superior sternum along the sternoclavicular joints.  Review of the MIP images confirms the above findings.  IMPRESSION: 1. No filling defect is identified in the pulmonary arterial tree to suggest pulmonary embolus. 2. Abnormal stranding around the pancreatic tail and in the left upper quadrant. Pancreatitis and other sources of left upper quadrant inflammation are not excluded. 3. Considerable cholelithiasis. 4. Suspected the right kidney upper pole nephrolithiasis, partially included on today's exam. 5. Mild peripancreatic adenopathy of uncertain significance. 6. Paraseptal emphysema. 7. Pulmonary fibrosis.   Electronically Signed   By: Herbie Baltimore M.D.   On: 05/13/2013 11:41         Subjective: Patient denies fevers, chills, dizziness,  headache, shortness breath, nausea, vomiting, diarrhea, abdominal pain. He has some substernal and epigastric discomfort.   Objective: Filed Vitals:   05/12/13 1905 05/13/13 0500 05/13/13 1017 05/13/13 1329  BP: 150/75 129/70 129/69 135/73  Pulse: 62 70 51 56  Temp: 98 F (36.7 C) 98.5 F (36.9 C)  97.6 F (36.4 C)  TempSrc: Oral Oral  Oral  Resp: 20 17  16   Height: 5\' 11"  (1.803 m)     Weight: 74.844 kg (165 lb)     SpO2: 99% 99% 99% 98%    Intake/Output Summary (Last 24 hours) at 05/13/13 1906 Last data filed at 05/13/13 1800  Gross per 24 hour  Intake   1140 ml  Output      0 ml  Net   1140 ml   Weight change:  Exam:   General:  Pt is alert, follows commands appropriately, not in acute distress  HEENT: No icterus, No thrush, [place]/AT  Cardiovascular: RRR, S1/S2, no rubs, no gallops  Respiratory: CTA bilaterally, no wheezing, no crackles, no rhonchi  Abdomen: Soft/+BS, mild substernal/epigastric discomfort to palpation, non distended, no guarding  Extremities: No edema, No lymphangitis, No petechiae, No rashes, no synovitis  Data Reviewed: Basic Metabolic Panel:  Recent Labs Lab 05/12/13 1433 05/12/13 1948 05/13/13 0520  NA 141  --  142  K 4.1  --  4.2  CL 104  --  105  CO2 27  --  29  GLUCOSE 119*  --  116*  BUN 13  --  11  CREATININE 0.81  --  0.80  CALCIUM 9.3  --  9.0  MG  --  2.1  --    Liver Function Tests:  Recent Labs Lab 05/12/13 1948 05/13/13 0520  AST 56* 50*  ALT 47 41  ALKPHOS 110 101  BILITOT 0.5 0.3  PROT 8.2 7.3  ALBUMIN 3.5 3.1*    Recent Labs Lab 05/12/13 1948 05/12/13 2345  LIPASE 34 297*   No results found for this basename: AMMONIA,  in the last 168 hours CBC:  Recent Labs Lab 05/12/13 1433 05/12/13 1948 05/13/13 0520  WBC 4.5 4.4 5.6  NEUTROABS 1.6*  --   --   HGB 12.7* 12.9* 13.1  HCT 36.8* 36.7* 36.6*  MCV 92.2 92.7 92.2  PLT 215 214 208   Cardiac Enzymes:  Recent Labs Lab 05/12/13 1948  05/12/13 2345 05/13/13 0520  TROPONINI <0.30 <0.30 <0.30   BNP: No components found with this basename: POCBNP,  CBG: No results found for this basename: GLUCAP,  in the last 168 hours  No results found for this or any previous visit (from the past 240 hour(s)).   Scheduled Meds: . allopurinol  100 mg Oral Daily  . amLODipine  10 mg Oral Daily   And  . benazepril  20 mg Oral Daily  . aspirin EC  325 mg Oral Daily  . enoxaparin (LOVENOX) injection  40 mg Subcutaneous Q24H  . metoprolol succinate  100 mg Oral BID  . pantoprazole  40 mg Oral Daily  . sertraline  25 mg Oral Daily  . sodium chloride  3 mL Intravenous Q12H  . sodium chloride  3 mL Intravenous Q12H   Continuous Infusions:    Davy Faught, DO  [facility] Pager [phone]  If 7PM-7AM, please contact night-coverage [email or web address] Password [email or web address] 05/13/2013, 7:06 PM   LOS: 1 day

## 2013-05-13 NOTE — Progress Notes (Signed)
UR completed 

## 2013-05-14 ENCOUNTER — Observation Stay (HOSPITAL_COMMUNITY): Payer: Medicare Other

## 2013-05-14 ENCOUNTER — Encounter (HOSPITAL_COMMUNITY): Payer: Self-pay | Admitting: General Surgery

## 2013-05-14 DIAGNOSIS — R079 Chest pain, unspecified: Secondary | ICD-10-CM

## 2013-05-14 DIAGNOSIS — K859 Acute pancreatitis without necrosis or infection, unspecified: Secondary | ICD-10-CM

## 2013-05-14 DIAGNOSIS — K759 Inflammatory liver disease, unspecified: Secondary | ICD-10-CM

## 2013-05-14 LAB — COMPREHENSIVE METABOLIC PANEL
ALK PHOS: 99 U/L (ref 39–117)
ALT: 42 U/L (ref 0–53)
AST: 48 U/L — ABNORMAL HIGH (ref 0–37)
Albumin: 3.1 g/dL — ABNORMAL LOW (ref 3.5–5.2)
BILIRUBIN TOTAL: 0.5 mg/dL (ref 0.3–1.2)
BUN: 11 mg/dL (ref 6–23)
CHLORIDE: 101 meq/L (ref 96–112)
CO2: 29 meq/L (ref 19–32)
Calcium: 8.9 mg/dL (ref 8.4–10.5)
Creatinine, Ser: 0.87 mg/dL (ref 0.50–1.35)
GFR calc Af Amer: >90 mL/min (ref >90)
GFR calc non Af Amer: 84 mL/min — ABNORMAL LOW (ref >90)
Glucose, Bld: 100 mg/dL — ABNORMAL HIGH (ref 70–99)
POTASSIUM: 4 meq/L (ref 3.7–5.3)
Sodium: 139 mEq/L (ref 137–147)
Total Protein: 7.5 g/dL (ref 6.0–8.3)

## 2013-05-14 LAB — LIPASE, BLOOD: LIPASE: 92 U/L — AB (ref 11–59)

## 2013-05-14 MED ORDER — BISACODYL 10 MG RE SUPP
10.0000 mg | Freq: Every day | RECTAL | Status: DC | PRN
  Filled 2013-05-14: qty 1

## 2013-05-14 NOTE — Consult Note (Signed)
Derrick Dean [birth year]  [id number].    Requesting MD: Dr. Cruzita Lederer Chief Complaint/Reason for Consult: Left-sided Chest Pain HPI:  73 y/o AA male presents to [facility] with left-sided chest pain without radiation which started about 2 weeks ago and acutely worsened 2 days ago.  He describes the pain as a shooting/stabbing pain that is exacerbated with movement.  Staying still and laying down are relieving factors. Food does not exacerbate symptoms.  No c/o N/V.  The pain is constant through out the day and affects him most at night preventing him from falling asleep.  Also c/o significant fatigue which is ongoing for many months.  Pt admits to having multiple falls in the recent past.  He fell while getting out of the bathtub about 2 weeks ago but states he fell on the right side.  He rates the pain a 10 out of 10 when he came into the ED but he is not in any pain right now.    He has a hx of CAD-s/p stent placement in 01/2009, Arthritis, GERD, B12 nutirional deficiency, Gout, and Hepatitis C.  He has an appointment with his [place] doctor for further workup of  Hepatitis C next month. Not a current smoker, quit in 2003. 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  Admits to drinking 7 cans of beer per week.  He is a disabled veteran working as a Programmer, multimedia. Divorced with several children, daughter currently visiting him.  Denies SOB, fever/chills/night sweats, weight loss, hearing problems, sore throat, headaches, recent illness, sick contacts, abdominal pain, neck pain, urinary problems.  ROS: All systems reviewed and otherwise negative except for as above  Family History  Problem Relation Age of Onset  . Coronary artery disease Mother   . Thyroid disease Mother   . Heart disease Mother   . Coronary artery disease Father   . Heart disease Father     Past Medical History  Diagnosis Date  . Hypertension   . Arthritis   . History of heart artery stent   . Appendicitis   .  Hepatitis C infection     not treated - pending appointment in april 2015  . Anemia   . B12 nutritional deficiency     on supplement  . Trouble in sleeping   . Presbyopia     wears bifocals  . GERD (gastroesophageal reflux disease)     Past Surgical History  Procedure Laterality Date  . Total knee arthroplasty Bilateral   . Right shoulder surgery    . Left shoulder surgery    . Appendectomy      Social History:  reports that he quit smoking about 11 years ago. He has never used smokeless tobacco. He reports that he drinks about 4.2 ounces of alcohol per week. He reports that he does not use illicit drugs.  Allergies: No Known Allergies  Medications Prior to Admission  Medication Sig Dispense Refill  . acetaminophen (TYLENOL) 325 MG tablet Take 325 mg by mouth every 6 (six) hours as needed for moderate pain or fever.      Marland Kitchen allopurinol (ZYLOPRIM) 300 MG tablet Take 300 mg by mouth daily.      Marland Kitchen amLODipine-benazepril (LOTREL) 10-20 MG per capsule Take 1 capsule by mouth daily.      Marland Kitchen atorvastatin (LIPITOR) 80 MG tablet Take 80 mg by mouth daily.      . capsicum (ZOSTRIX) 0.075 % topical cream Apply 1 application topically 2 (two) times daily  as needed. For pain      . colchicine 0.6 MG tablet Take 0.6 mg by mouth daily as needed. For gout      . gabapentin (NEURONTIN) 300 MG capsule Take 300 mg by mouth 3 (three) times daily.      Marland Kitchen HYDROXYZINE PAMOATE PO Take 25 mg by mouth at bedtime as needed. For sleep      . ketoconazole (NIZORAL) 2 % cream Apply 1 application topically 2 (two) times daily.      Marland Kitchen losartan (COZAAR) 50 MG tablet Take 50 mg by mouth daily.      . metoprolol tartrate (LOPRESSOR) 25 MG tablet Take 12.5 mg by mouth 2 (two) times daily.      . naproxen (NAPROSYN) 500 MG tablet Take 500 mg by mouth 2 (two) times daily as needed for moderate pain.      Marland Kitchen omeprazole (PRILOSEC) 20 MG capsule Take 20 mg by mouth daily.      . sertraline (ZOLOFT) 100 MG tablet Take 100  mg by mouth daily.      . vitamin B-12 (CYANOCOBALAMIN) 1000 MCG tablet Take 1,000 mcg by mouth daily.        Blood pressure 147/69, pulse 58, temperature 98.3 F (36.8 C), temperature source Oral, resp. rate 18, height '5\' 11"'  (1.803 m), weight 165 lb (74.844 kg), SpO2 99.00%.  Physical Exam: General: pleasant, WD/WN AA malewho is laying in bed in NAD HEENT: head is normocephalic, atraumatic.  Sclera are noninjected.  PERRL.  Ears and nose without any masses or lesions.  Mouth is pink and moist Heart: regular, rate, and rhythm.  No obvious murmurs, gallops, or rubs noted.  Palpable radial and pedal pulses bilaterally Lungs: CTAB, no wheezes, rhonchi, or rales noted.  Respiratory effort nonlabored Abd: soft, NT/ND, +BS, no masses, hernias, or organomegaly, negative Murphy's sign, no LUQ pain to palpation MS: all 4 extremities are symmetrical with no cyanosis, clubbing, or edema. Skin: warm and dry with no masses, lesions, or rashes Psych: A&Ox3 with an appropriate affect.  Results for orders placed during the hospital encounter of 05/12/13 (from the past 48 hour(s))  LIPID PANEL     Status: Abnormal   Collection Time    05/12/13  7:46 PM      Result Value Range   Cholesterol 156  0 - 200 mg/dL   Triglycerides 147  <150 mg/dL   HDL 38 (*) >39 mg/dL   Total CHOL/HDL Ratio 4.1     VLDL 29  0 - 40 mg/dL   LDL Cholesterol 89  0 - 99 mg/dL   Comment:            Total Cholesterol/HDL:CHD Risk     Coronary Heart Disease Risk Table                         Men   Women      1/2 Average Risk   3.4   3.3      Average Risk       5.0   4.4      2 X Average Risk   9.6   7.1      3 X Average Risk  23.4   11.0                Use the calculated Patient Ratio     above and the CHD Risk Table     to determine the patient's CHD Risk.  ATP III CLASSIFICATION (LDL):      <100     mg/dL   Optimal      100-129  mg/dL   Near or Above                        Optimal      130-159  mg/dL    Borderline      160-189  mg/dL   High      >190     mg/dL   Very High     Performed at [facility]  CBC     Status: Abnormal   Collection Time    05/12/13  7:48 PM      Result Value Range   WBC 4.4  4.0 - 10.5 K/uL   RBC 3.96 (*) 4.22 - 5.81 MIL/uL   Hemoglobin 12.9 (*) 13.0 - 17.0 g/dL   HCT 36.7 (*) 39.0 - 52.0 %   MCV 92.7  78.0 - 100.0 fL   MCH 32.6  26.0 - 34.0 pg   MCHC 35.1  30.0 - 36.0 g/dL   RDW 13.8  11.5 - 15.5 %   Platelets 214  150 - 400 K/uL  HEPATIC FUNCTION PANEL     Status: Abnormal   Collection Time    05/12/13  7:48 PM      Result Value Range   Total Protein 8.2  6.0 - 8.3 g/dL   Albumin 3.5  3.5 - 5.2 g/dL   AST 56 (*) 0 - 37 U/L   ALT 47  0 - 53 U/L   Alkaline Phosphatase 110  39 - 117 U/L   Total Bilirubin 0.5  0.3 - 1.2 mg/dL   Bilirubin, Direct <0.2  0.0 - 0.3 mg/dL   Indirect Bilirubin NOT CALCULATED  0.3 - 0.9 mg/dL  MAGNESIUM     Status: None   Collection Time    05/12/13  7:48 PM      Result Value Range   Magnesium 2.1  1.5 - 2.5 mg/dL  PROTIME-INR     Status: None   Collection Time    05/12/13  7:48 PM      Result Value Range   Prothrombin Time 13.6  11.6 - 15.2 seconds   INR 1.06  0.00 - 1.49  TSH     Status: None   Collection Time    05/12/13  7:48 PM      Result Value Range   TSH 2.052  0.350 - 4.500 uIU/mL   Comment: Performed at [place]     Status: Abnormal   Collection Time    05/12/13  7:48 PM      Result Value Range   Hemoglobin A1C 6.1 (*) <5.7 %   Comment: (NOTE)                                                                               According to the ADA Clinical Practice Recommendations for 2011, when     HbA1c is used as a screening test:      >=6.5%   Diagnostic of Diabetes Mellitus               (  if abnormal result is confirmed)     5.7-6.4%   Increased risk of developing Diabetes Mellitus     References:Diagnosis and Classification of Diabetes Mellitus,Diabetes      NLGX,2119,41(DEYCX 1):S62-S69 and Standards of Medical Care in             Diabetes - 2011,Diabetes KGYJ,8563,14 (Suppl 1):S11-S61.   Mean Plasma Glucose 128 (*) <117 mg/dL   Comment: Performed at [facility]  TROPONIN I     Status: None   Collection Time    05/12/13  7:48 PM      Result Value Range   Troponin I <0.30  <0.30 ng/mL   Comment:            Due to the release kinetics of cTnI,     a negative result within the first hours     of the onset of symptoms does not rule out     myocardial infarction with certainty.     If myocardial infarction is still suspected,     repeat the test at appropriate intervals.  LIPASE, BLOOD     Status: None   Collection Time    05/12/13  7:48 PM      Result Value Range   Lipase 34  11 - 59 U/L  D-DIMER, QUANTITATIVE     Status: Abnormal   Collection Time    05/12/13  7:48 PM      Result Value Range   D-Dimer, Quant 0.55 (*) 0.00 - 0.48 ug/mL-FEU   Comment:            AT THE INHOUSE ESTABLISHED CUTOFF     VALUE OF 0.48 ug/mL FEU,     THIS ASSAY HAS BEEN DOCUMENTED     IN THE LITERATURE TO HAVE     A SENSITIVITY AND NEGATIVE     PREDICTIVE VALUE OF AT LEAST     98 TO 99%.  THE TEST RESULT     SHOULD BE CORRELATED WITH     AN ASSESSMENT OF THE CLINICAL     PROBABILITY OF DVT / VTE.  VITAMIN B12     Status: None   Collection Time    05/12/13  7:48 PM      Result Value Range   Vitamin B-12 872  211 - 911 pg/mL   Comment: Performed at [facility]     Status: None   Collection Time    05/12/13  7:48 PM      Result Value Range   Folate >20.0     Comment: (NOTE)     Reference Ranges            Deficient:       0.4 - 3.3 ng/mL            Indeterminate:   3.4 - 5.4 ng/mL            Normal:              > 5.4 ng/mL     Performed at [facility]     Status: None   Collection Time    05/12/13  7:48 PM      Result Value Range   Iron 94  42 - 135 ug/dL   TIBC 320  215 - 435 ug/dL    Saturation Ratios 29  20 - 55 %   UIBC 226  125 - 400 ug/dL   Comment: Performed at [facility]  FERRITIN     Status: Abnormal   Collection Time    05/12/13  7:48 PM      Result Value Range   Ferritin 484 (*) 22 - 322 ng/mL   Comment: Performed at [facility]     Status: Abnormal   Collection Time    05/12/13  7:48 PM      Result Value Range   Retic Ct Pct 1.5  0.4 - 3.1 %   RBC. 3.96 (*) 4.22 - 5.81 MIL/uL   Retic Count, Manual 59.4  19.0 - 186.0 K/uL  TROPONIN I     Status: None   Collection Time    05/12/13 11:45 PM      Result Value Range   Troponin I <0.30  <0.30 ng/mL   Comment:            Due to the release kinetics of cTnI,     a negative result within the first hours     of the onset of symptoms does not rule out     myocardial infarction with certainty.     If myocardial infarction is still suspected,     repeat the test at appropriate intervals.  LIPASE, BLOOD     Status: Abnormal   Collection Time    05/12/13 11:45 PM      Result Value Range   Lipase 297 (*) 11 - 59 U/L  TROPONIN I     Status: None   Collection Time    05/13/13  5:20 AM      Result Value Range   Troponin I <0.30  <0.30 ng/mL   Comment:            Due to the release kinetics of cTnI,     a negative result within the first hours     of the onset of symptoms does not rule out     myocardial infarction with certainty.     If myocardial infarction is still suspected,     repeat the test at appropriate intervals.  COMPREHENSIVE METABOLIC PANEL     Status: Abnormal   Collection Time    05/13/13  5:20 AM      Result Value Range   Sodium 142  137 - 147 mEq/L   Potassium 4.2  3.7 - 5.3 mEq/L   Chloride 105  96 - 112 mEq/L   CO2 29  19 - 32 mEq/L   Glucose, Bld 116 (*) 70 - 99 mg/dL   BUN 11  6 - 23 mg/dL   Creatinine, Ser 0.80  0.50 - 1.35 mg/dL   Calcium 9.0  8.4 - 10.5 mg/dL   Total Protein 7.3  6.0 - 8.3 g/dL   Albumin 3.1 (*) 3.5 - 5.2 g/dL   AST 50 (*)  0 - 37 U/L   ALT 41  0 - 53 U/L   Alkaline Phosphatase 101  39 - 117 U/L   Total Bilirubin 0.3  0.3 - 1.2 mg/dL   GFR calc non Af Amer 87 (*) >90 mL/min   GFR calc Af Amer >90  >90 mL/min   Comment: (NOTE)     The eGFR has been calculated using the CKD EPI equation.     This calculation has not been validated in all clinical situations.     eGFR's persistently <90 mL/min signify possible Chronic Kidney     Disease.  CBC     Status: Abnormal   Collection Time  05/13/13  5:20 AM      Result Value Range   WBC 5.6  4.0 - 10.5 K/uL   RBC 3.97 (*) 4.22 - 5.81 MIL/uL   Hemoglobin 13.1  13.0 - 17.0 g/dL   HCT 36.6 (*) 39.0 - 52.0 %   MCV 92.2  78.0 - 100.0 fL   MCH 33.0  26.0 - 34.0 pg   MCHC 35.8  30.0 - 36.0 g/dL   RDW 13.8  11.5 - 15.5 %   Platelets 208  150 - 400 K/uL  URINE RAPID DRUG SCREEN (HOSP PERFORMED)     Status: None   Collection Time    05/13/13 11:32 PM      Result Value Range   Opiates NONE DETECTED  NONE DETECTED   Cocaine NONE DETECTED  NONE DETECTED   Benzodiazepines NONE DETECTED  NONE DETECTED   Amphetamines NONE DETECTED  NONE DETECTED   Tetrahydrocannabinol NONE DETECTED  NONE DETECTED   Barbiturates NONE DETECTED  NONE DETECTED   Comment:            DRUG SCREEN FOR MEDICAL PURPOSES     ONLY.  IF CONFIRMATION IS NEEDED     FOR ANY PURPOSE, NOTIFY LAB     WITHIN 5 DAYS.                LOWEST DETECTABLE LIMITS     FOR URINE DRUG SCREEN     Drug Class       Cutoff (ng/mL)     Amphetamine      1000     Barbiturate      200     Benzodiazepine   267     Tricyclics       124     Opiates          300     Cocaine          300     THC              50  COMPREHENSIVE METABOLIC PANEL     Status: Abnormal   Collection Time    05/14/13  4:31 AM      Result Value Range   Sodium 139  137 - 147 mEq/L   Potassium 4.0  3.7 - 5.3 mEq/L   Chloride 101  96 - 112 mEq/L   CO2 29  19 - 32 mEq/L   Glucose, Bld 100 (*) 70 - 99 mg/dL   BUN 11  6 - 23 mg/dL    Creatinine, Ser 0.87  0.50 - 1.35 mg/dL   Calcium 8.9  8.4 - 10.5 mg/dL   Total Protein 7.5  6.0 - 8.3 g/dL   Albumin 3.1 (*) 3.5 - 5.2 g/dL   AST 48 (*) 0 - 37 U/L   ALT 42  0 - 53 U/L   Alkaline Phosphatase 99  39 - 117 U/L   Total Bilirubin 0.5  0.3 - 1.2 mg/dL   GFR calc non Af Amer 84 (*) >90 mL/min   GFR calc Af Amer >90  >90 mL/min   Comment: (NOTE)     The eGFR has been calculated using the CKD EPI equation.     This calculation has not been validated in all clinical situations.     eGFR's persistently <90 mL/min signify possible Chronic Kidney     Disease.   Dg Chest 2 View  05/12/2013   CLINICAL DATA:  Chest pain.  EXAM: CHEST  2 VIEW  COMPARISON:  PA and lateral chest 11/11/2010.  FINDINGS: The lungs are clear. Heart size is normal. No pneumothorax or pleural fluid. Postoperative change right shoulder is noted.  IMPRESSION: No acute disease.   Electronically Signed   By: Inge Rise M.D.   On: 05/12/2013 18:21   Ct Angio Chest Pe W/cm &/or Wo Cm  05/13/2013   CLINICAL DATA:  Elevated D-dimer. Left upper chest pain. Shortness of breath.  EXAM: CT ANGIOGRAPHY CHEST WITH CONTRAST  TECHNIQUE: Multidetector CT imaging of the chest was performed using the standard protocol during bolus administration of intravenous contrast. Multiplanar CT image reconstructions including MIPs were obtained to evaluate the vascular anatomy.  CONTRAST:  16m OMNIPAQUE IOHEXOL 350 MG/ML SOLN  COMPARISON:  DG CHEST 2 VIEW dated 05/12/2013; DG CHEST 2 VIEW dated 11/11/2010; DG CHEST 2 VIEW dated 03/22/2010  FINDINGS: No filling defect is identified in the pulmonary arterial tree to suggest pulmonary embolus. Mild aortic atherosclerotic intimal thickening and calcification noted. coronary artery calcification is observed.  There is abnormal edema and stranding in the left upper quadrant along with multiple gallstones measuring up to 2.5 cm in diameter, and mild peripancreatic adenopathy. Suspected right kidney  upper pole renal calculus.  Paraseptal emphysema. Left apical scarring. Mild peripheral coarse interstitial accentuation at the lung bases favoring fibrosis.  Severe degenerative left glenohumeral arthropathy.  Prominent spurring anterior to the cervical spinal column at the C6-7 level. Chronically fragmented spurs from the superior sternum along the sternoclavicular joints.  Review of the MIP images confirms the above findings.  IMPRESSION: 1. No filling defect is identified in the pulmonary arterial tree to suggest pulmonary embolus. 2. Abnormal stranding around the pancreatic tail and in the left upper quadrant. Pancreatitis and other sources of left upper quadrant inflammation are not excluded. 3. Considerable cholelithiasis. 4. Suspected the right kidney upper pole nephrolithiasis, partially included on today's exam. 5. Mild peripancreatic adenopathy of uncertain significance. 6. Paraseptal emphysema. 7. Pulmonary fibrosis.   Electronically Signed   By: WSherryl BartersM.D.   On: 05/13/2013 11:41   [place]domen Complete  05/14/2013   CLINICAL DATA:  Cholelithiasis.  Elevated LFTs  EXAM: ULTRASOUND ABDOMEN COMPLETE  COMPARISON:  None.  FINDINGS: Gallbladder:  Stones are identified within the lumen of the gallbladder. The largest measures 2.4 cm. No gallbladder wall thickening or pericholecystic fluid. Negative sonographic Murphy's sign.  Common bile duct:  Diameter: 7.6 mm.  Liver:  No focal lesion identified. Within normal limits in parenchymal echogenicity.  IVC:  No abnormality visualized.  Pancreas:  Not visualized.  Spleen:  Size and appearance within normal limits.  Right Kidney:  Length: 10.1 cm. Echogenicity within normal limits. No mass or hydronephrosis visualized.  Left Kidney:  Length: 10.2 cm. Echogenicity within normal limits. No mass or hydronephrosis visualized.  Abdominal aorta:  No aneurysm visualized.  Other findings:  None.  IMPRESSION: 1. Gallstones. 2. Mild increase caliber of the common  bile duct which measures 7.6 mm. If there is a clinical concern for bile duct biliary obstruction then further assessment with MRCP may be helpful.   Electronically Signed   By: TKerby MoorsM.D.   On: 05/14/2013 10:19       Assessment/Plan Acute pancreatitis Atypical chest pain Hepatitis C Hypertension Hx of CAD-s/p stent placement Impaired glucose tolerance Arthritis  GERD Gout B12 nutritional deficiency    Plan: 1. [place] 2. Continue home meds and have Internal  Medicine manage co-morbidities  3. Continue Lovenox for DVT prophylaxis  4. Regular diet and no fluids required  5. We are not impressed or significantly concerned that his gallbladder is linked to his current problem.  He may need to have his gallbladder removed at some point if he becomes symptomatic through the [place].  Would recommend continued workup by his [place] doctors.  Celene Squibb PA Student 05/14/2013, 2:54 PM Office #: [phone]

## 2013-05-14 NOTE — Progress Notes (Addendum)
AM dose ofToprol held d/t HR 48-50s. Pt asymptomatic. Dr Elvera Lennox made aware.

## 2013-05-14 NOTE — Consult Note (Signed)
Pt's left sided chest pain would be unusual for gallstone pancreatitis.    I am concerned about his fatigue and hep C.   Also, I am not sure about the [place] requirements for non urgent lap chole. Will get hep C RNA, and repeat lipase.    Will reexamine in AM.

## 2013-05-14 NOTE — Progress Notes (Signed)
PROGRESS NOTE  Derrick Dean BTD:[id number] DOB: 1940/11/07 DOA: 05/12/2013 PCP: Burtis Junes, MD  HPI: 73 year old male with a history of coronary artery disease, status post stent placement in October 2010, with his cardiologist at the [facility], who presents with left-sided chest pain, started 2 days ago, worsened with movement, and occasionally deep inspiration. Troponins are negative x3. CT scan of the chest was negative for pulmonary embolus, but showed pancreatic stranding with peripancreatic lymphadenopathy. The patient also had some substernal/epigastric discomfort. Lipase was ordered as a result of CT findings and clinical findings. Lipase was noted to be 297--this was an add-on test to blood work on evening of 05/12/13. Abdominal U/S was ordered but pt already ate; therefore had to be postponed until 05/14/13.   Assessment/Plan: Pancreatitis  -Clinically, patient is presenting with minimal / atypical symptoms but increased lipase and positive CT scan.  -he is tolerating diet  -abdominal US done this morning with gallstones, may well be the cause for his acuta pancreatitis. He has mild AST elevation, T bili is normal. Mild CBD dilation to 7.6. Possible that he passed a stone few days back. Consulted surgery, appreciate input.  -triglycerides 147  - UDS  Atypical chest pain  - Likely musculoskeletal  - Troponins negative x3  - Echocardiogram--EF 55-60%  Impaired glucose tolerance  - Hemoglobin A1c--6.1  - Lifestyle modification  - TSH 2.052  Hypertension  -Continue metoprolol succinate, amlodipine, benazepril   Diet: regular Fluids: none DVT Prophylaxis: Lovenox  Code Status: Full Family Communication: none  Disposition Plan: inpatient  Consultants:  Surgery  Procedures:  none   Antibiotics - none  HPI/Subjective: Feeling a lot better, tolerating food well.   Objective: Filed Vitals:   05/13/13 2102 05/14/13 0534 05/14/13 0911 05/14/13 0914    BP: 127/69 150/78 110/63   Pulse: 48 56  55  Temp: 97.9 F (36.6 C) 98.3 F (36.8 C)    TempSrc: Oral Oral    Resp: 18 18    Height:      Weight:      SpO2: 98% 99%      Intake/Output Summary (Last 24 hours) at 05/14/13 1346 Last data filed at 05/14/13 1258  Gross per 24 hour  Intake   1323 ml  Output      0 ml  Net   1323 ml   Filed Weights   05/12/13 1905  Weight: 74.844 kg (165 lb)    Exam:   General:  NAD  Cardiovascular: regular rate and rhythm, without MRG  Respiratory: good air movement, clear to auscultation throughout, no wheezing, ronchi or rales  Abdomen: soft, not tender to palpation, positive bowel sounds  MSK: no peripheral edema  Neuro: non focal  Data Reviewed: Basic Metabolic Panel:  Recent Labs Lab 05/12/13 1433 05/12/13 1948 05/13/13 0520 05/14/13 0431  NA 141  --  142 139  K 4.1  --  4.2 4.0  CL 104  --  105 101  CO2 27  --  29 29  GLUCOSE 119*  --  116* 100*  BUN 13  --  11 11  CREATININE 0.81  --  0.80 0.87  CALCIUM 9.3  --  9.0 8.9  MG  --  2.1  --   --    Liver Function Tests:  Recent Labs Lab 05/12/13 1948 05/13/13 0520 05/14/13 0431  AST 56* 50* 48*  ALT 47 41 42  ALKPHOS 110 101 99  BILITOT 0.5 0.3 0.5  PROT 8.2 7.3  7.5  ALBUMIN 3.5 3.1* 3.1*    Recent Labs Lab 05/12/13 1948 05/12/13 2345  LIPASE 34 297*   CBC:  Recent Labs Lab 05/12/13 1433 05/12/13 1948 05/13/13 0520  WBC 4.5 4.4 5.6  NEUTROABS 1.6*  --   --   HGB 12.7* 12.9* 13.1  HCT 36.8* 36.7* 36.6*  MCV 92.2 92.7 92.2  PLT 215 214 208   Cardiac Enzymes:  Recent Labs Lab 05/12/13 1948 05/12/13 2345 05/13/13 0520  TROPONINI <0.30 <0.30 <0.30   Studies: Dg Chest 2 View  05/12/2013   CLINICAL DATA:  Chest pain.  EXAM: CHEST  2 VIEW  COMPARISON:  PA and lateral chest 11/11/2010.  FINDINGS: The lungs are clear. Heart size is normal. No pneumothorax or pleural fluid. Postoperative change right shoulder is noted.  IMPRESSION: No acute  disease.   Electronically Signed   By: Drusilla Kanner M.D.   On: 05/12/2013 18:21   Ct Angio Chest Pe W/cm &/or Wo Cm  05/13/2013   CLINICAL DATA:  Elevated D-dimer. Left upper chest pain. Shortness of breath.  EXAM: CT ANGIOGRAPHY CHEST WITH CONTRAST  TECHNIQUE: Multidetector CT imaging of the chest was performed using the standard protocol during bolus administration of intravenous contrast. Multiplanar CT image reconstructions including MIPs were obtained to evaluate the vascular anatomy.  CONTRAST:  OMNIPAQUE IOHEXOL 350 MG/ML SOLN  COMPARISON:  DG CHEST 2 VIEW dated 05/12/2013; DG CHEST 2 VIEW dated 11/11/2010; DG CHEST 2 VIEW dated 03/22/2010  FINDINGS: No filling defect is identified in the pulmonary arterial tree to suggest pulmonary embolus. Mild aortic atherosclerotic intimal thickening and calcification noted. coronary artery calcification is observed.  There is abnormal edema and stranding in the left upper quadrant along with multiple gallstones measuring up to 2.5 cm in diameter, and mild peripancreatic adenopathy. Suspected right kidney upper pole renal calculus.  Paraseptal emphysema. Left apical scarring. Mild peripheral coarse interstitial accentuation at the lung bases favoring fibrosis.  Severe degenerative left glenohumeral arthropathy.  Prominent spurring anterior to the cervical spinal column at the C6-7 level. Chronically fragmented spurs from the superior sternum along the sternoclavicular joints.  Review of the MIP images confirms the above findings.  IMPRESSION: 1. No filling defect is identified in the pulmonary arterial tree to suggest pulmonary embolus. 2. Abnormal stranding around the pancreatic tail and in the left upper quadrant. Pancreatitis and other sources of left upper quadrant inflammation are not excluded. 3. Considerable cholelithiasis. 4. Suspected the right kidney upper pole nephrolithiasis, partially included on today's exam. 5. Mild peripancreatic adenopathy of  uncertain significance. 6. Paraseptal emphysema. 7. Pulmonary fibrosis.   Electronically Signed   By: Herbie Baltimore M.D.   On: 05/13/2013 11:41   US Abdomen Complete  05/14/2013   CLINICAL DATA:  Cholelithiasis.  Elevated LFTs  EXAM: ULTRASOUND ABDOMEN COMPLETE  COMPARISON:  None.  FINDINGS: Gallbladder:  Stones are identified within the lumen of the gallbladder. The largest measures 2.4 cm. No gallbladder wall thickening or pericholecystic fluid. Negative sonographic Murphy's sign.  Common bile duct:  Diameter: 7.6 mm.  Liver:  No focal lesion identified. Within normal limits in parenchymal echogenicity.  IVC:  No abnormality visualized.  Pancreas:  Not visualized.  Spleen:  Size and appearance within normal limits.  Right Kidney:  Length: 10.1 cm. Echogenicity within normal limits. No mass or hydronephrosis visualized.  Left Kidney:  Length: 10.2 cm. Echogenicity within normal limits. No mass or hydronephrosis visualized.  Abdominal aorta:  No aneurysm visualized.  Other findings:  None.  IMPRESSION: 1. Gallstones. 2. Mild increase caliber of the common bile duct which measures 7.6 mm. If there is a clinical concern for bile duct biliary obstruction then further assessment with MRCP may be helpful.   Electronically Signed   By: Signa Kell M.D.   On: 05/14/2013 10:19   Scheduled Meds: . allopurinol  100 mg Oral Daily  . amLODipine  10 mg Oral Daily   And  . benazepril  20 mg Oral Daily  . aspirin EC  325 mg Oral Daily  . docusate sodium  200 mg Oral QHS  . enoxaparin (LOVENOX) injection  40 mg Subcutaneous Q24H  . metoprolol succinate  100 mg Oral BID  . pantoprazole  40 mg Oral Daily  . sertraline  25 mg Oral Daily  . sodium chloride  3 mL Intravenous Q12H  . sodium chloride  3 mL Intravenous Q12H   Continuous Infusions:   Active Problems:   Chest pain   Pancreatitis, acute   Unspecified essential hypertension   Transaminasemia   Time spent: 33  Pamella Pert, MD [facility] Pager [phone]. If 7 PM - 7 AM, please contact night-coverage at [email or web address], password [facility] 05/14/2013, 1:46 PM  LOS: 2 days

## 2013-05-15 DIAGNOSIS — K8020 Calculus of gallbladder without cholecystitis without obstruction: Secondary | ICD-10-CM | POA: Diagnosis present

## 2013-05-15 DIAGNOSIS — B1920 Unspecified viral hepatitis C without hepatic coma: Secondary | ICD-10-CM | POA: Diagnosis present

## 2013-05-15 DIAGNOSIS — R1013 Epigastric pain: Secondary | ICD-10-CM | POA: Diagnosis present

## 2013-05-15 LAB — HEPATITIS C VRS RNA DETECT BY PCR-QUAL: Hepatitis C Vrs RNA by PCR-Qual: POSITIVE — AB

## 2013-05-15 NOTE — Progress Notes (Signed)
Agree with above.  Follow up with surgery at [facility]

## 2013-05-15 NOTE — Discharge Summary (Signed)
Physician Discharge Summary  Derrick Dean WSF:[id number] DOB: 10-Mar-1941 DOA: 05/12/2013  PCP: Burtis Junes, MD  Admit date: 05/12/2013 Discharge date: 05/15/2013  Time spent: 35 minutes  Recommendations for Outpatient Follow-up:  1. [facility] as scheduled   Recommendations for primary care physician for things to follow:  Surgery referral  Discharge Diagnoses:  Principal Problem:   Abdominal pain, epigastric Active Problems:   Chest pain   Pancreatitis, acute   Unspecified essential hypertension   Transaminasemia   HCV (hepatitis C virus)   Cholelithiasis  Discharge Condition: stable  Diet recommendation: low fat  Filed Weights   05/12/13 1905  Weight: 74.844 kg (165 lb)   History of present illness:  73 year old male with a history of coronary artery disease, status post stent placement in October 2010, with his cardiologist at the [facility], who presents with left-sided chest pain, started 2 days ago, worsened with movement, denies any correlation to deep inspiration. He denies any recent history of travel, any history of DVT/PE, does complain of mild shortness of breath orthopnea. He has not had a stress test since his stent placement, and doses compliance with aspirin, sometimes misses a few doses here and there. No history of prior myocardial infarction. He had mild AST elevation to 50, normal ALT, Alkaline phosphatase and bilirubin. It is possible that he passed a stone in the recent past which contributed to his pancreatitis. Surgery has been consulted while inpatient and he will need a cholecystectomy in the near future as an outpatient. Patient is to [facility] in [facility] days as previously scheduled.  Atypical chest pain - Likely musculoskeletal, more epigastric than chest,  Troponins negative x3 Echocardiogram--EF 55-60%  Impaired glucose tolerance - Hemoglobin A1c--6.1 - Lifestyle modification - TSH 2.052  Hypertension  -Continue metoprolol succinate, amlodipine, benazepril   Procedures:  2D echo Study Conclusions - Left ventricle: The cavity size was normal. There was mild focal basal hypertrophy of the septum. Systolic function was normal. The estimated ejection fraction was in the range of 55% to 60%. Wall motion was normal; there were no regional wall motion abnormalities. Left ventricular diastolic function parameters were normal. There was no evidence of elevated ventricular filling pressure by Doppler parameters.  Consultations:  Surgery  Discharge Exam: Filed Vitals:   05/14/13 0914 05/14/13 1410 05/14/13 2030 05/15/13 0450  BP:  147/69 146/73 143/75  Pulse: 55 58 61 56  Temp:  98.3 F (36.8 C) 97.8 F (36.6 C) 98.2 F (36.8 C)  TempSrc:  Oral Oral Oral  Resp:  18 20 20   Height:      Weight:      SpO2:  99% 99% 98%   General: NAD Cardiovascular: RRR Respiratory: CTA biL  Discharge Instructions    Medication List         acetaminophen 325 MG tablet  Commonly known as:  TYLENOL  Take 325 mg by mouth every 6 (six) hours as needed for moderate pain or fever.     allopurinol 300 MG tablet  Commonly known as:  ZYLOPRIM  Take 300 mg by mouth daily.     amLODipine-benazepril 10-20 MG per capsule  Commonly known as:  LOTREL  Take 1 capsule by mouth daily.     atorvastatin 80 MG tablet  Commonly known as:  LIPITOR  Take 80 mg by mouth daily.     capsicum 0.075 % topical cream  Commonly known as:  ZOSTRIX  Apply 1 application topically 2 (two) times daily as needed. For pain     colchicine 0.6 MG tablet  Take 0.6 mg by mouth daily as needed. For gout     gabapentin 300 MG capsule  Commonly known as:  NEURONTIN  Take 300 mg by mouth 3 (three) times daily.     HYDROXYZINE PAMOATE PO  Take 25 mg by mouth at bedtime as needed. For sleep     ketoconazole 2 % cream  Commonly known as:  NIZORAL  Apply 1 application topically 2 (two) times daily.     losartan 50 MG tablet    Commonly known as:  COZAAR  Take 50 mg by mouth daily.     metoprolol tartrate 25 MG tablet  Commonly known as:  LOPRESSOR  Take 12.5 mg by mouth 2 (two) times daily.     naproxen 500 MG tablet  Commonly known as:  NAPROSYN  Take 500 mg by mouth 2 (two) times daily as needed for moderate pain.     omeprazole 20 MG capsule  Commonly known as:  PRILOSEC  Take 20 mg by mouth daily.     sertraline 100 MG tablet  Commonly known as:  ZOLOFT  Take 100 mg by mouth daily.     vitamin B-12 1000 MCG tablet  Commonly known as:  CYANOCOBALAMIN  Take 1,000 mcg by mouth daily.           Follow-up Information   [facility] (as previously scheduled)       The results of significant diagnostics from this hospitalization (including imaging, microbiology, ancillary and laboratory) are listed below for reference.    Significant Diagnostic Studies: Dg Chest 2 View  05/12/2013   CLINICAL DATA:  Chest pain.  EXAM: CHEST  2 VIEW  COMPARISON:  PA and lateral chest 11/11/2010.  FINDINGS: The lungs are clear. Heart size is normal. No pneumothorax or pleural fluid. Postoperative change right shoulder is noted.  IMPRESSION: No acute disease.   Electronically Signed   By: Drusilla Kannerhomas  Dalessio M.D.   On: 05/12/2013 18:21   Ct Angio Chest Pe W/cm &/or Wo Cm  05/13/2013   CLINICAL DATA:  Elevated D-dimer. Left upper chest pain. Shortness of breath.  EXAM: CT ANGIOGRAPHY CHEST WITH CONTRAST  TECHNIQUE: Multidetector CT imaging of the chest was performed using the standard protocol during bolus administration of intravenous contrast. Multiplanar CT image reconstructions including MIPs were obtained to evaluate the vascular anatomy.  CONTRAST:  100mL OMNIPAQUE IOHEXOL 350 MG/ML SOLN  COMPARISON:  DG CHEST 2 VIEW dated 05/12/2013; DG CHEST 2 VIEW dated 11/11/2010; DG CHEST 2 VIEW dated 03/22/2010  FINDINGS: No filling defect is identified in the pulmonary arterial tree to suggest pulmonary embolus. Mild  aortic atherosclerotic intimal thickening and calcification noted. coronary artery calcification is observed.  There is abnormal edema and stranding in the left upper quadrant along with multiple gallstones measuring up to 2.5 cm in diameter, and mild peripancreatic adenopathy. Suspected right kidney upper pole renal calculus.  Paraseptal emphysema. Left apical scarring. Mild peripheral coarse interstitial accentuation at the lung bases favoring fibrosis.  Severe degenerative left glenohumeral arthropathy.  Prominent spurring anterior to the cervical spinal column at the C6-7 level. Chronically fragmented spurs from the superior sternum along the sternoclavicular joints.  Review of the MIP images confirms the above findings.  IMPRESSION: 1. No filling defect is identified in the pulmonary arterial tree to suggest pulmonary embolus. 2. Abnormal stranding around  the pancreatic tail and in the left upper quadrant. Pancreatitis and other sources of left upper quadrant inflammation are not excluded. 3. Considerable cholelithiasis. 4. Suspected the right kidney upper pole nephrolithiasis, partially included on today's exam. 5. Mild peripancreatic adenopathy of uncertain significance. 6. Paraseptal emphysema. 7. Pulmonary fibrosis.   Electronically Signed   By: Herbie Baltimore M.D.   On: 05/13/2013 11:41   US Abdomen Complete  05/14/2013   CLINICAL DATA:  Cholelithiasis.  Elevated LFTs  EXAM: ULTRASOUND ABDOMEN COMPLETE  COMPARISON:  None.  FINDINGS: Gallbladder:  Stones are identified within the lumen of the gallbladder. The largest measures 2.4 cm. No gallbladder wall thickening or pericholecystic fluid. Negative sonographic Murphy's sign.  Common bile duct:  Diameter: 7.6 mm.  Liver:  No focal lesion identified. Within normal limits in parenchymal echogenicity.  IVC:  No abnormality visualized.  Pancreas:  Not visualized.  Spleen:  Size and appearance within normal limits.  Right Kidney:  Length: 10.1 cm.  Echogenicity within normal limits. No mass or hydronephrosis visualized.  Left Kidney:  Length: 10.2 cm. Echogenicity within normal limits. No mass or hydronephrosis visualized.  Abdominal aorta:  No aneurysm visualized.  Other findings:  None.  IMPRESSION: 1. Gallstones. 2. Mild increase caliber of the common bile duct which measures 7.6 mm. If there is a clinical concern for bile duct biliary obstruction then further assessment with MRCP may be helpful.   Electronically Signed   By: Signa Kell M.D.   On: 05/14/2013 10:19   Labs: Basic Metabolic Panel:  Recent Labs Lab 05/12/13 1433 05/12/13 1948 05/13/13 0520 05/14/13 0431  NA 141  --  142 139  K 4.1  --  4.2 4.0  CL 104  --  105 101  CO2 27  --  29 29  GLUCOSE 119*  --  116* 100*  BUN 13  --  11 11  CREATININE 0.81  --  0.80 0.87  CALCIUM 9.3  --  9.0 8.9  MG  --  2.1  --   --    Liver Function Tests:  Recent Labs Lab 05/12/13 1948 05/13/13 0520 05/14/13 0431  AST 56* 50* 48*  ALT 47 41 42  ALKPHOS 110 101 99  BILITOT 0.5 0.3 0.5  PROT 8.2 7.3 7.5  ALBUMIN 3.5 3.1* 3.1*    Recent Labs Lab 05/12/13 1948 05/12/13 2345 05/14/13 1730  LIPASE 34 297* 92*   CBC:  Recent Labs Lab 05/12/13 1433 05/12/13 1948 05/13/13 0520  WBC 4.5 4.4 5.6  NEUTROABS 1.6*  --   --   HGB 12.7* 12.9* 13.1  HCT 36.8* 36.7* 36.6*  MCV 92.2 92.7 92.2  PLT 215 214 208   Cardiac Enzymes:  Recent Labs Lab 05/12/13 1948 05/12/13 2345 05/13/13 0520  TROPONINI <0.30 <0.30 <0.30    Signed:  Pamella Pert  [facility] 05/15/2013, 4:01 PM

## 2013-05-15 NOTE — Discharge Instructions (Signed)
Please take these instructions to your PCP at your next appointment.   Follow with Primary MD Burtis Junes, MD in 5-7 days   You had mild pancreatitis for which you were hospitalized. Ultrasound of your liver showed gallstones which may cause pancreatitis and would recommend that you will be [facility] for [facility] for [facility].   Get Medicines reviewed and adjusted.  Please request your Prim.MD to go over all Hospital Tests and Procedure/Radiological results at the follow up, please get all Hospital records sent to your Prim MD by signing hospital release before you go home.  Activity: As tolerated with Full fall precautions use walker/cane & assistance as needed  Diet: low fat  For Heart failure patients - Check your Weight same time everyday, if you gain over 2 pounds, or you develop in leg swelling, experience more shortness of breath or chest pain, call your Primary MD immediately. Follow Cardiac Low Salt Diet and 1.8 lit/day fluid restriction.  Disposition Home  If you experience worsening of your admission symptoms, develop shortness of breath, life threatening emergency, suicidal or homicidal thoughts you must seek medical attention immediately by calling 911 or calling your MD immediately  if symptoms less severe.  You Must read complete instructions/literature along with all the possible adverse reactions/side effects for all the Medicines you take and that have been prescribed to you. Take any new Medicines after you have completely understood and accpet all the possible adverse reactions/side effects.   Do not drive and provide baby sitting services if your were admitted for syncope or siezures until you have seen by Primary MD or a Neurologist and advised to do so again.  Do not drive when taking Pain medications.   Do not take more than prescribed Pain, Sleep and Anxiety Medications  Special Instructions: If you have smoked or chewed Tobacco   in the last 2 yrs please stop smoking, stop any regular Alcohol  and or any Recreational drug use.  Wear Seat belts while driving.

## 2013-05-15 NOTE — Progress Notes (Signed)
Patient ID: Derrick Dean, male   DOB: 02-25-1941, 73 y.o.   MRN: [id number]    Subjective: Pt feels well today.  No abdominal pain.  Tolerating a regular diet.  Objective: Vital signs in last 24 hours: Temp:  [97.8 F (36.6 C)-98.3 F (36.8 C)] 98.2 F (36.8 C) (01/29 0450) Pulse Rate:  [56-61] 56 (01/29 0450) Resp:  [18-20] 20 (01/29 0450) BP: (143-147)/(69-75) 143/75 mmHg (01/29 0450) SpO2:  [98 %-99 %] 98 % (01/29 0450) Last BM Date: 05/14/13  Intake/Output from previous day: 01/28 0701 - 01/29 0700 In: 600 [P.O.:600] Out: -  Intake/Output this shift:    PE: Abd: soft, NT, ND, +BS  Lab Results:   Recent Labs  05/12/13 1948 05/13/13 0520  WBC 4.4 5.6  HGB 12.9* 13.1  HCT 36.7* 36.6*  PLT 214 208   BMET  Recent Labs  05/13/13 0520 05/14/13 0431  NA 142 139  K 4.2 4.0  CL 105 101  CO2 29 29  GLUCOSE 116* 100*  BUN 11 11  CREATININE 0.80 0.87  CALCIUM 9.0 8.9   PT/INR  Recent Labs  05/12/13 1948  LABPROT 13.6  INR 1.06   CMP     Component Value Date/Time   NA 139 05/14/2013 0431   K 4.0 05/14/2013 0431   CL 101 05/14/2013 0431   CO2 29 05/14/2013 0431   GLUCOSE 100* 05/14/2013 0431   BUN 11 05/14/2013 0431   CREATININE 0.87 05/14/2013 0431   CALCIUM 8.9 05/14/2013 0431   PROT 7.5 05/14/2013 0431   ALBUMIN 3.1* 05/14/2013 0431   AST 48* 05/14/2013 0431   ALT 42 05/14/2013 0431   ALKPHOS 99 05/14/2013 0431   BILITOT 0.5 05/14/2013 0431   GFRNONAA 84* 05/14/2013 0431   GFRAA >90 05/14/2013 0431   Lipase     Component Value Date/Time   LIPASE 92* 05/14/2013 1730       Studies/Results: Ct Angio Chest Pe W/cm &/or Wo Cm  05/13/2013   CLINICAL DATA:  Elevated D-dimer. Left upper chest pain. Shortness of breath.  EXAM: CT ANGIOGRAPHY CHEST WITH CONTRAST  TECHNIQUE: Multidetector CT imaging of the chest was performed using the standard protocol during bolus administration of intravenous contrast. Multiplanar CT image reconstructions including MIPs  were obtained to evaluate the vascular anatomy.  CONTRAST:  OMNIPAQUE IOHEXOL 350 MG/ML SOLN  COMPARISON:  DG CHEST 2 VIEW dated 05/12/2013; DG CHEST 2 VIEW dated 11/11/2010; DG CHEST 2 VIEW dated 03/22/2010  FINDINGS: No filling defect is identified in the pulmonary arterial tree to suggest pulmonary embolus. Mild aortic atherosclerotic intimal thickening and calcification noted. coronary artery calcification is observed.  There is abnormal edema and stranding in the left upper quadrant along with multiple gallstones measuring up to 2.5 cm in diameter, and mild peripancreatic adenopathy. Suspected right kidney upper pole renal calculus.  Paraseptal emphysema. Left apical scarring. Mild peripheral coarse interstitial accentuation at the lung bases favoring fibrosis.  Severe degenerative left glenohumeral arthropathy.  Prominent spurring anterior to the cervical spinal column at the C6-7 level. Chronically fragmented spurs from the superior sternum along the sternoclavicular joints.  Review of the MIP images confirms the above findings.  IMPRESSION: 1. No filling defect is identified in the pulmonary arterial tree to suggest pulmonary embolus. 2. Abnormal stranding around the pancreatic tail and in the left upper quadrant. Pancreatitis and other sources of left upper quadrant inflammation are not excluded. 3. Considerable cholelithiasis. 4. Suspected the right kidney upper pole nephrolithiasis, partially  included on today's exam. 5. Mild peripancreatic adenopathy of uncertain significance. 6. Paraseptal emphysema. 7. Pulmonary fibrosis.   Electronically Signed   By: Herbie Baltimore M.D.   On: 05/13/2013 11:41   US Abdomen Complete  05/14/2013   CLINICAL DATA:  Cholelithiasis.  Elevated LFTs  EXAM: ULTRASOUND ABDOMEN COMPLETE  COMPARISON:  None.  FINDINGS: Gallbladder:  Stones are identified within the lumen of the gallbladder. The largest measures 2.4 cm. No gallbladder wall thickening or pericholecystic  fluid. Negative sonographic Murphy's sign.  Common bile duct:  Diameter: 7.6 mm.  Liver:  No focal lesion identified. Within normal limits in parenchymal echogenicity.  IVC:  No abnormality visualized.  Pancreas:  Not visualized.  Spleen:  Size and appearance within normal limits.  Right Kidney:  Length: 10.1 cm. Echogenicity within normal limits. No mass or hydronephrosis visualized.  Left Kidney:  Length: 10.2 cm. Echogenicity within normal limits. No mass or hydronephrosis visualized.  Abdominal aorta:  No aneurysm visualized.  Other findings:  None.  IMPRESSION: 1. Gallstones. 2. Mild increase caliber of the common bile duct which measures 7.6 mm. If there is a clinical concern for bile duct biliary obstruction then further assessment with MRCP may be helpful.   Electronically Signed   By: Signa Kell M.D.   On: 05/14/2013 10:19    Anti-infectives: Anti-infectives   None       Assessment/Plan  1. Gallstones 2. Pancreatitis 3. Chest pain 4. Hep C  Plan: 1. Doubt chest pain the patient presented with is secondary to his gallstones.  Maybe secondary to mild pancreatitis.  He should get a lap chole at some point in the future.  Given he is tolerating a regular diet at this point, he may go home [facility].  He sees his PCP on Monday and he is to d/w them a surgical evaluation.  He is stable for dc home from our standpoint.  We will sign off.   LOS: 3 days    Scarleth Brame E 05/15/2013, 10:47 AM Pager: [phone]

## 2013-05-21 ENCOUNTER — Ambulatory Visit (INDEPENDENT_AMBULATORY_CARE_PROVIDER_SITE_OTHER): Payer: Medicare Other | Admitting: Family Medicine

## 2013-05-21 ENCOUNTER — Ambulatory Visit: Payer: Medicare Other

## 2013-05-21 VITALS — BP 124/76 | HR 86 | Temp 98.3°F | Resp 17 | Ht 72.0 in | Wt 172.0 lb

## 2013-05-21 DIAGNOSIS — M549 Dorsalgia, unspecified: Secondary | ICD-10-CM

## 2013-05-21 DIAGNOSIS — M25511 Pain in right shoulder: Secondary | ICD-10-CM

## 2013-05-21 DIAGNOSIS — M47816 Spondylosis without myelopathy or radiculopathy, lumbar region: Secondary | ICD-10-CM

## 2013-05-21 DIAGNOSIS — M25519 Pain in unspecified shoulder: Secondary | ICD-10-CM

## 2013-05-21 DIAGNOSIS — M47817 Spondylosis without myelopathy or radiculopathy, lumbosacral region: Secondary | ICD-10-CM

## 2013-05-21 DIAGNOSIS — M1610 Unilateral primary osteoarthritis, unspecified hip: Secondary | ICD-10-CM

## 2013-05-21 DIAGNOSIS — M1611 Unilateral primary osteoarthritis, right hip: Secondary | ICD-10-CM

## 2013-05-21 DIAGNOSIS — M25559 Pain in unspecified hip: Secondary | ICD-10-CM

## 2013-05-21 NOTE — Progress Notes (Signed)
Subjective: 73 year old man who took the garbage out on Monday and after thorough a dumpster card around and slipped on a little spot, falling hard on his right side. He is continued to hurt in the shoulder, low back, and right hip area. He decided to come on in today and get checked out. Otherwise he is generally healthy and does pretty well. He lives independently.  Objective: Pleasant gentleman in no major distress, but obviously hurts when he just moved. He is right shoulder is tender from the lower easiest down into the shoulder girdle posteriorly. He has a scar on anterior aspect of his shoulder where [facility] orthopedics he had surgery and cleaned out the joint about 5 years ago. He is tender in the midline of the lumbar spine at about the L4-5 level. He has tenderness along the right iliac crest, in the buttock, in the hip, and around to the greater trochanter straight leg raising negative in the seated position.  Assessment: Fall with injury of right shoulder, low back, and hip and pelvis.  Plan: X-rays  [facility] reading (PRIMARY) by  Dr. Alwyn Ren Shoulder x-ray normal Hip x-ray mild arthritic changes of right hip as compared to left on the pelvic view. No acute injuries Lumbar spine has extensive degenerative changes in the lower segments , no acute injury  Assessment: Arthritis back Arthritis hip Pain of shoulder hip and back secondary to contusions and strain.  Plan: Ibuprofen Return if worse See discharge instructions

## 2013-05-21 NOTE — Patient Instructions (Signed)
Take over-the-counter ibuprofen 600 mg (3x200 mg) 3 times daily as needed for pain and inflammation  Applying ice to the painful areas or ice and heat alternating may give some relief  If pain is worse or persisting please return. Pain should gradually resolve with time.

## 2013-05-29 ENCOUNTER — Other Ambulatory Visit: Payer: Self-pay

## 2013-05-29 DIAGNOSIS — M25519 Pain in unspecified shoulder: Secondary | ICD-10-CM

## 2013-09-25 ENCOUNTER — Observation Stay (HOSPITAL_COMMUNITY)
Admission: EM | Admit: 2013-09-25 | Discharge: 2013-09-26 | Disposition: A | Discharge status: Home / Self Care | Payer: Medicare Other | Attending: Internal Medicine | Admitting: Internal Medicine

## 2013-09-25 ENCOUNTER — Emergency Department (HOSPITAL_COMMUNITY): Payer: Medicare Other

## 2013-09-25 ENCOUNTER — Encounter (HOSPITAL_COMMUNITY): Payer: Self-pay | Admitting: Emergency Medicine

## 2013-09-25 DIAGNOSIS — I10 Essential (primary) hypertension: Secondary | ICD-10-CM | POA: Insufficient documentation

## 2013-09-25 DIAGNOSIS — I2510 Atherosclerotic heart disease of native coronary artery without angina pectoris: Secondary | ICD-10-CM

## 2013-09-25 DIAGNOSIS — J069 Acute upper respiratory infection, unspecified: Principal | ICD-10-CM | POA: Insufficient documentation

## 2013-09-25 DIAGNOSIS — B1920 Unspecified viral hepatitis C without hepatic coma: Secondary | ICD-10-CM

## 2013-09-25 DIAGNOSIS — H524 Presbyopia: Secondary | ICD-10-CM | POA: Insufficient documentation

## 2013-09-25 DIAGNOSIS — Z87891 Personal history of nicotine dependence: Secondary | ICD-10-CM | POA: Insufficient documentation

## 2013-09-25 DIAGNOSIS — R079 Chest pain, unspecified: Secondary | ICD-10-CM | POA: Diagnosis present

## 2013-09-25 DIAGNOSIS — Z9861 Coronary angioplasty status: Secondary | ICD-10-CM | POA: Insufficient documentation

## 2013-09-25 DIAGNOSIS — R071 Chest pain on breathing: Secondary | ICD-10-CM | POA: Insufficient documentation

## 2013-09-25 DIAGNOSIS — K219 Gastro-esophageal reflux disease without esophagitis: Secondary | ICD-10-CM | POA: Insufficient documentation

## 2013-09-25 DIAGNOSIS — J40 Bronchitis, not specified as acute or chronic: Secondary | ICD-10-CM | POA: Insufficient documentation

## 2013-09-25 DIAGNOSIS — J00 Acute nasopharyngitis [common cold]: Secondary | ICD-10-CM | POA: Diagnosis present

## 2013-09-25 DIAGNOSIS — E538 Deficiency of other specified B group vitamins: Secondary | ICD-10-CM | POA: Insufficient documentation

## 2013-09-25 DIAGNOSIS — Z96659 Presence of unspecified artificial knee joint: Secondary | ICD-10-CM | POA: Insufficient documentation

## 2013-09-25 LAB — CBC
HEMATOCRIT: 42 % (ref 39.0–52.0)
HEMOGLOBIN: 14.7 g/dL (ref 13.0–17.0)
MCH: 32.7 pg (ref 26.0–34.0)
MCHC: 35 g/dL (ref 30.0–36.0)
MCV: 93.3 fL (ref 78.0–100.0)
Platelets: 231 10*3/uL (ref 150–400)
RBC: 4.5 MIL/uL (ref 4.22–5.81)
RDW: 13 % (ref 11.5–15.5)
WBC: 5.2 10*3/uL (ref 4.0–10.5)

## 2013-09-25 LAB — PRO B NATRIURETIC PEPTIDE: Pro B Natriuretic peptide (BNP): 346.9 pg/mL — ABNORMAL HIGH (ref 0–125)

## 2013-09-25 LAB — BASIC METABOLIC PANEL
BUN: 11 mg/dL (ref 6–23)
CO2: 26 mEq/L (ref 19–32)
Calcium: 9.9 mg/dL (ref 8.4–10.5)
Chloride: 102 mEq/L (ref 96–112)
Creatinine, Ser: 0.96 mg/dL (ref 0.50–1.35)
GFR calc Af Amer: >90 mL/min (ref >90)
GFR, EST NON AFRICAN AMERICAN: 80 mL/min — AB (ref >90)
GLUCOSE: 99 mg/dL (ref 70–99)
Potassium: 3.9 mEq/L (ref 3.7–5.3)
Sodium: 141 mEq/L (ref 137–147)

## 2013-09-25 LAB — I-STAT TROPONIN, ED: Troponin i, poc: 0.01 ng/mL (ref 0.00–0.08)

## 2013-09-25 LAB — TROPONIN I: Troponin I: <0.3 ng/mL (ref <0.30)

## 2013-09-25 MED ORDER — SODIUM CHLORIDE 0.9 % IJ SOLN: 3.0000 mL | Freq: Two times a day (BID) | INTRAMUSCULAR | Status: DC

## 2013-09-25 MED ORDER — NAPROXEN 500 MG PO TABS
500.0000 mg | ORAL_TABLET | Freq: Two times a day (BID) | ORAL | Status: DC | PRN
  Administered 2013-09-25 – 2013-09-26 (×2): 500 mg via ORAL
  Filled 2013-09-25 (×2): qty 1

## 2013-09-25 MED ORDER — SODIUM CHLORIDE 0.9 % IV SOLN: 250.0000 mL | INTRAVENOUS | Status: DC | PRN

## 2013-09-25 MED ORDER — ATORVASTATIN CALCIUM 80 MG PO TABS
80.0000 mg | ORAL_TABLET | Freq: Every day | ORAL | Status: DC
  Administered 2013-09-26: 80 mg via ORAL
  Filled 2013-09-25: qty 1

## 2013-09-25 MED ORDER — GABAPENTIN 300 MG PO CAPS
300.0000 mg | ORAL_CAPSULE | Freq: Three times a day (TID) | ORAL | Status: DC
  Administered 2013-09-25 – 2013-09-26 (×2): 300 mg via ORAL
  Filled 2013-09-25 (×4): qty 1

## 2013-09-25 MED ORDER — GUAIFENESIN-DM 100-10 MG/5ML PO SYRP
5.0000 mL | ORAL_SOLUTION | ORAL | Status: DC | PRN
  Administered 2013-09-25 – 2013-09-26 (×2): 5 mL via ORAL
  Filled 2013-09-25 (×2): qty 10

## 2013-09-25 MED ORDER — VITAMIN B-12 1000 MCG PO TABS
1000.0000 ug | ORAL_TABLET | Freq: Every day | ORAL | Status: DC
  Administered 2013-09-26: 1000 ug via ORAL
  Filled 2013-09-25: qty 1

## 2013-09-25 MED ORDER — SODIUM CHLORIDE 0.9 % IJ SOLN: 3.0000 mL | INTRAMUSCULAR | Status: DC | PRN

## 2013-09-25 MED ORDER — METOPROLOL TARTRATE 12.5 MG HALF TABLET
12.5000 mg | ORAL_TABLET | Freq: Two times a day (BID) | ORAL | Status: DC
  Administered 2013-09-25 – 2013-09-26 (×2): 12.5 mg via ORAL
  Filled 2013-09-25 (×3): qty 1

## 2013-09-25 MED ORDER — SERTRALINE HCL 100 MG PO TABS
100.0000 mg | ORAL_TABLET | Freq: Every day | ORAL | Status: DC
  Administered 2013-09-25 – 2013-09-26 (×2): 100 mg via ORAL
  Filled 2013-09-25 (×2): qty 1

## 2013-09-25 MED ORDER — SODIUM CHLORIDE 0.9 % IJ SOLN
3.0000 mL | Freq: Two times a day (BID) | INTRAMUSCULAR | Status: DC
Start: 2013-09-25 — End: 2013-09-26
  Administered 2013-09-25 – 2013-09-26 (×2): 3 mL via INTRAVENOUS

## 2013-09-25 MED ORDER — BIOTENE DRY MOUTH MT LIQD
15.0000 mL | Freq: Two times a day (BID) | OROMUCOSAL | Status: DC
Start: 2013-09-26 — End: 2013-09-26
  Administered 2013-09-26: 15 mL via OROMUCOSAL

## 2013-09-25 MED ORDER — LOSARTAN POTASSIUM 50 MG PO TABS
50.0000 mg | ORAL_TABLET | Freq: Every day | ORAL | Status: DC
  Administered 2013-09-26: 50 mg via ORAL
  Filled 2013-09-25: qty 1

## 2013-09-25 MED ORDER — CHLORHEXIDINE GLUCONATE 0.12 % MT SOLN
15.0000 mL | Freq: Two times a day (BID) | OROMUCOSAL | Status: DC
  Administered 2013-09-25 – 2013-09-26 (×2): 15 mL via OROMUCOSAL
  Filled 2013-09-25 (×4): qty 15

## 2013-09-25 MED ORDER — ACETAMINOPHEN 325 MG PO TABS: 325.0000 mg | ORAL_TABLET | Freq: Four times a day (QID) | ORAL | Status: DC | PRN

## 2013-09-25 MED ORDER — ALLOPURINOL 300 MG PO TABS
300.0000 mg | ORAL_TABLET | Freq: Every day | ORAL | Status: DC
  Administered 2013-09-26: 300 mg via ORAL
  Filled 2013-09-25: qty 1

## 2013-09-25 NOTE — ED Notes (Signed)
Called floor to give report Nurse in patient room--call back number provided

## 2013-09-25 NOTE — ED Notes (Signed)
Patient given ice water after OK by Dr. Onalee Hua

## 2013-09-25 NOTE — ED Provider Notes (Signed)
Medical screening examination/treatment/procedure(s) were conducted as a shared visit with non-physician practitioner(s) and myself.  I personally evaluated the patient during the encounter.   EKG Interpretation   Date/Time:  Thursday September 25 2013 15:51:31 EDT Ventricular Rate:  89 PR Interval:  123 QRS Duration: 77 QT Interval:  322 QTC Calculation: 392 R Axis:   72 Text Interpretation:  Sinus rhythm Biatrial enlargement Minimal ST  depression, diffuse leads Similar to prior Confirmed by Gwendolyn Grant  MD, Rayson Rando  ([id number]) on 09/25/2013 4:11:38 PM      Patient with hx of CAD with stent presents with atypical chest pain. Described as a ripple. Multiple occurences for past few days. Has these regularly, but they are increasing.  EKG ok, exam benign.  Work/up benign. Admitted for ACS r/o.  Dagmar Hait, MD 09/25/13 2258

## 2013-09-25 NOTE — ED Notes (Signed)
Pt states that he has been having SOB and CP w/ dry cough and nasal congestion x 2 days.  States that he has not had any energy.

## 2013-09-25 NOTE — ED Notes (Signed)
Bed: WTR7 Expected date:  Expected time:  Means of arrival:  Comments: Block for EKG

## 2013-09-25 NOTE — ED Provider Notes (Signed)
CSN: [id number]     Arrival date & time 09/25/13  1545 History   First MD Initiated Contact with Patient 09/25/13 1605     Chief Complaint  Patient presents with  . Cough  . Shortness of Breath  . Chest Pain     (Consider location/radiation/quality/duration/timing/severity/associated sxs/prior Treatment) Patient is a 73 y.o. male presenting with cough, shortness of breath, and chest pain. The history is provided by the patient. No language interpreter was used.  Cough Cough characteristics:  Dry Severity:  Moderate Onset quality:  Gradual Duration:  2 days Progression:  Worsening Chronicity:  New Smoker: no (former)   Worsened by:  Activity Ineffective treatments:  Rest Associated symptoms: chest pain, chills, diaphoresis, fever, rhinorrhea and shortness of breath   Chest pain:    Quality:  Unable to specify   Severity:  Mild   Duration:  2 days   Timing:  Intermittent   Progression:  Waxing and waning   Chronicity:  New Rhinorrhea:    Quality:  Clear   Severity:  Mild   Duration:  2 days Shortness of breath:    Severity:  Mild   Onset quality:  Gradual   Duration:  2 days   Timing:  Intermittent   Progression:  Waxing and waning Risk factors: no recent infection and no recent travel   Shortness of Breath Associated symptoms: chest pain, cough, diaphoresis and fever   Chest Pain Associated symptoms: cough, diaphoresis, fatigue, fever and shortness of breath     Past Medical History  Diagnosis Date  . Hypertension   . Arthritis   . History of heart artery stent   . Appendicitis   . Hepatitis C infection     not treated - pending appointment in april 2015  . Anemia   . B12 nutritional deficiency     on supplement  . Trouble in sleeping   . Presbyopia     wears bifocals  . GERD (gastroesophageal reflux disease)    Past Surgical History  Procedure Laterality Date  . Total knee arthroplasty Bilateral   . Right shoulder surgery    . Left shoulder surgery     . Appendectomy     Family History  Problem Relation Age of Onset  . Coronary artery disease Mother   . Thyroid disease Mother   . Heart disease Mother   . Coronary artery disease Father   . Heart disease Father    History  Substance Use Topics  . Smoking status: Former Smoker    Quit date: 09/14/2001  . Smokeless tobacco: Never Used  . Alcohol Use: 4.2 oz/week    7 Cans of beer per week     Comment: approx 2 beers two to three times per week, 7 cans/week    Review of Systems  Constitutional: Positive for fever, chills, diaphoresis and fatigue.  HENT: Positive for rhinorrhea.   Respiratory: Positive for cough and shortness of breath.   Cardiovascular: Positive for chest pain.  All other systems reviewed and are negative.     Allergies  Review of patient's allergies indicates no known allergies.  Home Medications   Prior to Admission medications   Medication Sig Start Date End Date Taking? Authorizing Provider  acetaminophen (TYLENOL) 325 MG tablet Take 325 mg by mouth every 6 (six) hours as needed for moderate pain or fever.    Historical Provider, MD  allopurinol (ZYLOPRIM) 300 MG tablet Take 300 mg by mouth daily.    Historical Provider, MD  amLODipine-benazepril (LOTREL) 10-20 MG per capsule Take 1 capsule by mouth daily.    Historical Provider, MD  atorvastatin (LIPITOR) 80 MG tablet Take 80 mg by mouth daily.    Historical Provider, MD  capsicum (ZOSTRIX) 0.075 % topical cream Apply 1 application topically 2 (two) times daily as needed. For pain    Historical Provider, MD  colchicine 0.6 MG tablet Take 0.6 mg by mouth daily as needed. For gout    Historical Provider, MD  gabapentin (NEURONTIN) 300 MG capsule Take 300 mg by mouth 3 (three) times daily.    Historical Provider, MD  HYDROXYZINE PAMOATE PO Take 25 mg by mouth at bedtime as needed. For sleep    Historical Provider, MD  ketoconazole (NIZORAL) 2 % cream Apply 1 application topically 2 (two) times daily.     Historical Provider, MD  losartan (COZAAR) 50 MG tablet Take 50 mg by mouth daily.    Historical Provider, MD  metoprolol tartrate (LOPRESSOR) 25 MG tablet Take 12.5 mg by mouth 2 (two) times daily.    Historical Provider, MD  naproxen (NAPROSYN) 500 MG tablet Take 500 mg by mouth 2 (two) times daily as needed for moderate pain.    Historical Provider, MD  omeprazole (PRILOSEC) 20 MG capsule Take 20 mg by mouth daily.    Historical Provider, MD  sertraline (ZOLOFT) 100 MG tablet Take 100 mg by mouth daily.    Historical Provider, MD  vitamin B-12 (CYANOCOBALAMIN) 1000 MCG tablet Take 1,000 mcg by mouth daily.    Historical Provider, MD   BP 144/77  Pulse 92  Temp(Src) 99.7 F (37.6 C) (Oral)  Resp 24  SpO2 99% Physical Exam  Nursing note and vitals reviewed. Constitutional: He is oriented to person, place, and time. He appears well-developed and well-nourished.  HENT:  Head: Normocephalic and atraumatic.  Mouth/Throat: Oropharynx is clear and moist.  Eyes: Conjunctivae are normal. Pupils are equal, round, and reactive to light.  Neck: Normal range of motion.  Cardiovascular: Normal rate and regular rhythm.   Pulmonary/Chest: Effort normal. He has no wheezes. He has no rales. He exhibits tenderness.  Abdominal: Soft. Bowel sounds are normal.  Musculoskeletal: He exhibits no edema.  Lymphadenopathy:    He has cervical adenopathy.  Neurological: He is alert and oriented to person, place, and time.  Skin: Skin is warm and dry.  Psychiatric: He has a normal mood and affect.    ED Course  Procedures (including critical care time) Labs Review Labs Reviewed  CBC  BASIC METABOLIC PANEL  PRO B NATRIURETIC PEPTIDE  I-STAT TROPOININ, ED    Imaging Review No results found.   EKG Interpretation   Date/Time:  Thursday September 25 2013 15:51:31 EDT Ventricular Rate:  89 PR Interval:  123 QRS Duration: 77 QT Interval:  322 QTC Calculation: 392 R Axis:   72 Text Interpretation:   Sinus rhythm Biatrial enlargement Minimal ST  depression, diffuse leads Similar to prior Confirmed by Gwendolyn Grant  MD, BLAIR  ([id number]) on 09/25/2013 4:11:38 PM    Patient presents to ED today for evaluation of fatigue, left anterior chest pain, cough onset 2 days ago.  Lab and radiology results reviewed, shared with patient.  No anemia,  Initial troponin of 0.01, BNP of 347, no pneumonia or other acute finding on CXR.  Discussed with Dr. Gwendolyn Grant, will request observation admission for atypical chest pain. MDM   Final diagnoses:  None    Atypical chest pain.    Jimmye Norman, NP  09/25/13 1903 

## 2013-09-25 NOTE — H&P (Signed)
PCP:   Burtis Junes, MD   Chief Complaint:  Cough, nasal congestion, malaise for 2 days  HPI: 73 yo male h/o CAD s/p stenting 5 years ago at [place] in [place] comes in with several days of coughing a lot, nasal congestion, feeling awful and has pleuritic type chest pain from all the coughing.   Some subj fevers at home.  Cough is nonproductive.  No le swelling or edema.  No sick contacts.  We are asked to obs pt for his chest pain.  Review of Systems:  Positive and negative as per HPI otherwise all other systems are negative  Past Medical History: Past Medical History  Diagnosis Date  . Hypertension   . Arthritis   . History of heart artery stent   . Appendicitis   . Hepatitis C infection     not treated - pending appointment in april 2015  . Anemia   . B12 nutritional deficiency     on supplement  . Trouble in sleeping   . Presbyopia     wears bifocals  . GERD (gastroesophageal reflux disease)    Past Surgical History  Procedure Laterality Date  . Total knee arthroplasty Bilateral   . Right shoulder surgery    . Left shoulder surgery    . Appendectomy      Medications: Prior to Admission medications   Medication Sig Start Date End Date Taking? Authorizing Provider  vitamin B-12 (CYANOCOBALAMIN) 1000 MCG tablet Take 1,000 mcg by mouth daily.   Yes Historical Provider, MD  acetaminophen (TYLENOL) 325 MG tablet Take 325 mg by mouth every 6 (six) hours as needed for moderate pain or fever.    Historical Provider, MD  allopurinol (ZYLOPRIM) 300 MG tablet Take 300 mg by mouth daily.    Historical Provider, MD  amLODipine-benazepril (LOTREL) 10-20 MG per capsule Take 1 capsule by mouth daily.    Historical Provider, MD  atorvastatin (LIPITOR) 80 MG tablet Take 80 mg by mouth daily.    Historical Provider, MD  capsicum (ZOSTRIX) 0.075 % topical cream Apply 1 application topically 2 (two) times daily as needed. For pain    Historical Provider, MD  colchicine 0.6 MG tablet  Take 0.6 mg by mouth daily as needed. For gout    Historical Provider, MD  gabapentin (NEURONTIN) 300 MG capsule Take 300 mg by mouth 3 (three) times daily.    Historical Provider, MD  HYDROXYZINE PAMOATE PO Take 25 mg by mouth at bedtime as needed. For sleep    Historical Provider, MD  ketoconazole (NIZORAL) 2 % cream Apply 1 application topically 2 (two) times daily.    Historical Provider, MD  losartan (COZAAR) 50 MG tablet Take 50 mg by mouth daily.    Historical Provider, MD  metoprolol tartrate (LOPRESSOR) 25 MG tablet Take 12.5 mg by mouth 2 (two) times daily.    Historical Provider, MD  naproxen (NAPROSYN) 500 MG tablet Take 500 mg by mouth 2 (two) times daily as needed for moderate pain.    Historical Provider, MD  omeprazole (PRILOSEC) 20 MG capsule Take 20 mg by mouth daily.    Historical Provider, MD  sertraline (ZOLOFT) 100 MG tablet Take 100 mg by mouth daily.    Historical Provider, MD    Allergies:  No Known Allergies  Social History:  reports that he quit smoking about 12 years ago. He has never used smokeless tobacco. He reports that he drinks about 4.2 ounces of alcohol per week. He reports that he  does not use illicit drugs.  Family History: Family History  Problem Relation Age of Onset  . Coronary artery disease Mother   . Thyroid disease Mother   . Heart disease Mother   . Coronary artery disease Father   . Heart disease Father     Physical Exam: Filed Vitals:   09/25/13 1800 09/25/13 1830 09/25/13 1859 09/25/13 1900  BP: 144/68 152/72 152/72 146/74  Pulse: 79 79 80 86  Temp:      TempSrc:      Resp:      SpO2: 97% 99% 98% 97%   General appearance: alert, cooperative and no distress Head: Normocephalic, without obvious abnormality, atraumatic Eyes: negative Nose: Nares normal. Septum midline. Mucosa normal. No drainage or sinus tenderness., no discharge, moderate congestion, no sinus tenderness Neck: no JVD and supple, symmetrical, trachea  midline Lungs: clear to auscultation bilaterally Heart: regular rate and rhythm, S1, S2 normal, no murmur, click, rub or gallop Abdomen: soft, non-tender; bowel sounds normal; no masses,  no organomegaly Extremities: extremities normal, atraumatic, no cyanosis or edema Pulses: 2+ and symmetric Skin: Skin color, texture, turgor normal. No rashes or lesions Neurologic: Grossly normal    Labs on Admission:   Recent Labs  09/25/13 1617  NA 141  K 3.9  CL 102  CO2 26  GLUCOSE 99  BUN 11  CREATININE 0.96  CALCIUM 9.9    Recent Labs  09/25/13 1617  WBC 5.2  HGB 14.7  HCT 42.0  MCV 93.3  PLT 231   Radiological Exams on Admission: Dg Chest Port 1 View  09/25/2013   CLINICAL DATA:  Cough and shortness of breath  EXAM: PORTABLE CHEST - 1 VIEW  COMPARISON:  05/12/2013  FINDINGS: Cardiac shadow is stable. The lungs are well aerated bilaterally. Chronic changes in the distal right clavicle are seen.  IMPRESSION: No acute abnormality noted.   Electronically Signed   By: Alcide Clever M.D.   On: 09/25/2013 16:32    Assessment/Plan  73 yo male with URI and chest pain pleuritic in nature associated with his current active coughing  Principal Problem:   Common cold-  cxr is negative.  This is likely viral.  Supportive care with cough medication.    Active Problems:  Stable unless o/w noted.   Chest pain-  Had nml echo in jan.  Serial enzymes.  No further w/u unless he rules in, [facility] [place]     Unspecified essential hypertension   HCV (hepatitis C virus)   CAD S/P percutaneous coronary angioplasty  obs on tele.  Full code.  Jeneal Vogl A 09/25/2013, 7:42 PM

## 2013-09-26 LAB — TROPONIN I
Troponin I: <0.3 ng/mL (ref <0.30)
Troponin I: <0.3 ng/mL (ref <0.30)

## 2013-09-26 MED ORDER — AMOXICILLIN-POT CLAVULANATE 875-125 MG PO TABS: 1.0000 | ORAL_TABLET | Freq: Two times a day (BID) | ORAL | Status: DC

## 2013-09-26 MED ORDER — GUAIFENESIN-DM 100-10 MG/5ML PO SYRP: 5.0000 mL | ORAL_SOLUTION | ORAL | Status: DC | PRN

## 2013-09-26 MED ORDER — CETIRIZINE HCL 10 MG PO TABS: 10.0000 mg | ORAL_TABLET | Freq: Every day | ORAL | Status: DC

## 2013-09-26 NOTE — Discharge Summary (Signed)
Physician Discharge Summary  Derrick Dean UDJ:[id number] DOB: 1940-08-28 DOA: 09/25/2013  PCP: Burtis Junes, MD  Admit date: 09/25/2013 Discharge date: 09/26/2013  Time spent: 35 minutes  Recommendations for Outpatient Follow-up:  1. Follow up with primary cardiologist for stress test.   Discharge Diagnoses:    URI, bronchitis   Chest pain   Unspecified essential hypertension   HCV (hepatitis C virus)   CAD S/P percutaneous coronary angioplasty   Discharge Condition: Stable.   Diet recommendation: Heart Healthy  Filed Weights   09/25/13 2116  Weight: 76.159 kg (167 lb 14.4 oz)    History of present illness:  73 yo male h/o CAD s/p stenting 5 years ago at [place] in [place] comes in with several days of coughing a lot, nasal congestion, feeling awful and has pleuritic type chest pain from all the coughing. Some subj fevers at home. Cough is nonproductive. No le swelling or edema. No sick contacts. We are asked to obs pt for his chest pain   Hospital Course:  1-URI, Bronchitis. :Probably viral infections vs bacterial. I will give short course of Augmentin, antihistamine, and antitussive medications. Patient feeling better. Chest x ray negative.   2-Chest pain; patient relates pain with inspiration and after coughing. Troponin times 3 negative. He is chest pain free. I have advised patient to follow up with primary cardiologist.  3-Continue with medications for chronic medical problems.   Procedures:  none  Consultations:  None  Discharge Exam: Filed Vitals:   09/26/13 1010  BP: 143/68  Pulse: 65  Temp:   Resp:     General: No distress.  Cardiovascular: S 1, S 2 RRR Respiratory: CTA  Discharge Instructions You were cared for by a hospitalist during your hospital stay. If you have any questions about your discharge medications or the care you received while you were in the hospital after you are discharged, you can call the unit and asked to speak with the  hospitalist on call if the hospitalist that took care of you is not available. Once you are discharged, your primary care physician will handle any further medical issues. Please note that NO REFILLS for any discharge medications will be authorized once you are discharged, as it is imperative that you return to your primary care physician (or establish a relationship with a primary care physician if you do not have one) for your aftercare needs so that they can reassess your need for medications and monitor your lab values.  Discharge Instructions   Diet - low sodium heart healthy    Complete by:  As directed      Increase activity slowly    Complete by:  As directed             Medication List         acetaminophen 325 MG tablet  Commonly known as:  TYLENOL  Take 325 mg by mouth every 6 (six) hours as needed for moderate pain or fever.     allopurinol 300 MG tablet  Commonly known as:  ZYLOPRIM  Take 300 mg by mouth daily.     amLODipine-benazepril 10-20 MG per capsule  Commonly known as:  LOTREL  Take 1 capsule by mouth daily.     amoxicillin-clavulanate 875-125 MG per tablet  Commonly known as:  AUGMENTIN  Take 1 tablet by mouth 2 (two) times daily.     atorvastatin 80 MG tablet  Commonly known as:  LIPITOR  Take 80 mg by mouth daily.  capsicum 0.075 % topical cream  Commonly known as:  ZOSTRIX  Apply 1 application topically 2 (two) times daily as needed. For pain     cetirizine 10 MG tablet  Commonly known as:  ZYRTEC  Take 1 tablet (10 mg total) by mouth daily.     colchicine 0.6 MG tablet  Take 0.6 mg by mouth daily as needed. For gout     gabapentin 300 MG capsule  Commonly known as:  NEURONTIN  Take 300 mg by mouth 3 (three) times daily.     guaiFENesin-dextromethorphan 100-10 MG/5ML syrup  Commonly known as:  ROBITUSSIN DM  Take 5 mLs by mouth every 4 (four) hours as needed for cough.     HYDROXYZINE PAMOATE PO  Take 25 mg by mouth at bedtime as  needed. For sleep     ketoconazole 2 % cream  Commonly known as:  NIZORAL  Apply 1 application topically 2 (two) times daily.     losartan 50 MG tablet  Commonly known as:  COZAAR  Take 50 mg by mouth daily.     metoprolol tartrate 25 MG tablet  Commonly known as:  LOPRESSOR  Take 12.5 mg by mouth 2 (two) times daily.     naproxen 500 MG tablet  Commonly known as:  NAPROSYN  Take 500 mg by mouth 2 (two) times daily as needed for moderate pain.     omeprazole 20 MG capsule  Commonly known as:  PRILOSEC  Take 20 mg by mouth daily.     sertraline 100 MG tablet  Commonly known as:  ZOLOFT  Take 100 mg by mouth daily.     vitamin B-12 1000 MCG tablet  Commonly known as:  CYANOCOBALAMIN  Take 1,000 mcg by mouth daily.       No Known Allergies     Follow-up Information   Follow up with Burtis Junes, MD.   Specialty:  Family Medicine   Contact information:   [address] [address] [place] [address] [phone]        The results of significant diagnostics from this hospitalization (including imaging, microbiology, ancillary and laboratory) are listed below for reference.    Significant Diagnostic Studies: Dg Chest Port 1 View  09/25/2013   CLINICAL DATA:  Cough and shortness of breath  EXAM: PORTABLE CHEST - 1 VIEW  COMPARISON:  05/12/2013  FINDINGS: Cardiac shadow is stable. The lungs are well aerated bilaterally. Chronic changes in the distal right clavicle are seen.  IMPRESSION: No acute abnormality noted.   Electronically Signed   By: Alcide Clever M.D.   On: 09/25/2013 16:32    Microbiology: No results found for this or any previous visit (from the past 240 hour(s)).   Labs: Basic Metabolic Panel:  Recent Labs Lab 09/25/13 1617  NA 141  K 3.9  CL 102  CO2 26  GLUCOSE 99  BUN 11  CREATININE 0.96  CALCIUM 9.9   Liver Function Tests: No results found for this basename: AST, ALT, ALKPHOS, BILITOT, PROT, ALBUMIN,  in the last 168  hours No results found for this basename: LIPASE, AMYLASE,  in the last 168 hours No results found for this basename: AMMONIA,  in the last 168 hours CBC:  Recent Labs Lab 09/25/13 1617  WBC 5.2  HGB 14.7  HCT 42.0  MCV 93.3  PLT 231   Cardiac Enzymes:  Recent Labs Lab 09/25/13 2024 09/26/13 0245 09/26/13 0802  TROPONINI <0.30 <0.30 <0.30   BNP:  BNP (last 3 results)  Recent Labs  09/25/13 1617  PROBNP 346.9*   CBG: No results found for this basename: GLUCAP,  in the last 168 hours     Signed:  Hartley Barefoot A  [facility] 09/26/2013, 12:06 PM

## 2013-11-20 ENCOUNTER — Emergency Department (HOSPITAL_COMMUNITY): Payer: Medicare Other

## 2013-11-20 ENCOUNTER — Encounter (HOSPITAL_COMMUNITY): Payer: Self-pay | Admitting: Emergency Medicine

## 2013-11-20 ENCOUNTER — Emergency Department (HOSPITAL_COMMUNITY)
Admission: EM | Admit: 2013-11-20 | Discharge: 2013-11-20 | Disposition: A | Discharge status: Home / Self Care | Payer: Medicare Other | Attending: Emergency Medicine | Admitting: Emergency Medicine

## 2013-11-20 DIAGNOSIS — K8000 Calculus of gallbladder with acute cholecystitis without obstruction: Secondary | ICD-10-CM | POA: Diagnosis not present

## 2013-11-20 DIAGNOSIS — N23 Unspecified renal colic: Secondary | ICD-10-CM | POA: Diagnosis not present

## 2013-11-20 DIAGNOSIS — Z862 Personal history of diseases of the blood and blood-forming organs and certain disorders involving the immune mechanism: Secondary | ICD-10-CM | POA: Insufficient documentation

## 2013-11-20 DIAGNOSIS — Z9861 Coronary angioplasty status: Secondary | ICD-10-CM | POA: Diagnosis not present

## 2013-11-20 DIAGNOSIS — Z8619 Personal history of other infectious and parasitic diseases: Secondary | ICD-10-CM | POA: Diagnosis not present

## 2013-11-20 DIAGNOSIS — K5900 Constipation, unspecified: Secondary | ICD-10-CM | POA: Diagnosis not present

## 2013-11-20 DIAGNOSIS — M129 Arthropathy, unspecified: Secondary | ICD-10-CM | POA: Diagnosis not present

## 2013-11-20 DIAGNOSIS — R109 Unspecified abdominal pain: Secondary | ICD-10-CM | POA: Diagnosis present

## 2013-11-20 DIAGNOSIS — Z87891 Personal history of nicotine dependence: Secondary | ICD-10-CM | POA: Insufficient documentation

## 2013-11-20 DIAGNOSIS — K219 Gastro-esophageal reflux disease without esophagitis: Secondary | ICD-10-CM | POA: Diagnosis not present

## 2013-11-20 DIAGNOSIS — Z7982 Long term (current) use of aspirin: Secondary | ICD-10-CM | POA: Diagnosis not present

## 2013-11-20 DIAGNOSIS — I10 Essential (primary) hypertension: Secondary | ICD-10-CM | POA: Insufficient documentation

## 2013-11-20 DIAGNOSIS — D518 Other vitamin B12 deficiency anemias: Secondary | ICD-10-CM | POA: Insufficient documentation

## 2013-11-20 DIAGNOSIS — K8020 Calculus of gallbladder without cholecystitis without obstruction: Secondary | ICD-10-CM

## 2013-11-20 DIAGNOSIS — Z79899 Other long term (current) drug therapy: Secondary | ICD-10-CM | POA: Diagnosis not present

## 2013-11-20 LAB — COMPREHENSIVE METABOLIC PANEL
ALT: 32 U/L (ref 0–53)
AST: 43 U/L — AB (ref 0–37)
Albumin: 4 g/dL (ref 3.5–5.2)
Alkaline Phosphatase: 111 U/L (ref 39–117)
Anion gap: 11 (ref 5–15)
BILIRUBIN TOTAL: 0.6 mg/dL (ref 0.3–1.2)
BUN: 7 mg/dL (ref 6–23)
CO2: 27 meq/L (ref 19–32)
CREATININE: 0.84 mg/dL (ref 0.50–1.35)
Calcium: 9.6 mg/dL (ref 8.4–10.5)
Chloride: 97 mEq/L (ref 96–112)
GFR, EST NON AFRICAN AMERICAN: 85 mL/min — AB (ref >90)
GLUCOSE: 173 mg/dL — AB (ref 70–99)
Potassium: 3.6 mEq/L — ABNORMAL LOW (ref 3.7–5.3)
Sodium: 135 mEq/L — ABNORMAL LOW (ref 137–147)
Total Protein: 8.7 g/dL — ABNORMAL HIGH (ref 6.0–8.3)

## 2013-11-20 LAB — CBC WITH DIFFERENTIAL/PLATELET
BASOS ABS: 0 10*3/uL (ref 0.0–0.1)
Basophils Relative: 0 % (ref 0–1)
EOS PCT: 0 % (ref 0–5)
Eosinophils Absolute: 0 10*3/uL (ref 0.0–0.7)
HEMATOCRIT: 39.7 % (ref 39.0–52.0)
Hemoglobin: 14.1 g/dL (ref 13.0–17.0)
LYMPHS ABS: 1.1 10*3/uL (ref 0.7–4.0)
LYMPHS PCT: 16 % (ref 12–46)
MCH: 33 pg (ref 26.0–34.0)
MCHC: 35.5 g/dL (ref 30.0–36.0)
MCV: 93 fL (ref 78.0–100.0)
MONO ABS: 0.4 10*3/uL (ref 0.1–1.0)
MONOS PCT: 6 % (ref 3–12)
NEUTROS ABS: 5.3 10*3/uL (ref 1.7–7.7)
Neutrophils Relative %: 78 % — ABNORMAL HIGH (ref 43–77)
Platelets: 251 10*3/uL (ref 150–400)
RBC: 4.27 MIL/uL (ref 4.22–5.81)
RDW: 13.8 % (ref 11.5–15.5)
WBC: 6.7 10*3/uL (ref 4.0–10.5)

## 2013-11-20 LAB — URINALYSIS, ROUTINE W REFLEX MICROSCOPIC
BILIRUBIN URINE: NEGATIVE
GLUCOSE, UA: NEGATIVE mg/dL
HGB URINE DIPSTICK: NEGATIVE
Ketones, ur: NEGATIVE mg/dL
Leukocytes, UA: NEGATIVE
Nitrite: NEGATIVE
PROTEIN: NEGATIVE mg/dL
Specific Gravity, Urine: 1.013 (ref 1.005–1.030)
UROBILINOGEN UA: 0.2 mg/dL (ref 0.0–1.0)
pH: 7.5 (ref 5.0–8.0)

## 2013-11-20 LAB — I-STAT CG4 LACTIC ACID, ED: Lactic Acid, Venous: 1.41 mmol/L (ref 0.5–2.2)

## 2013-11-20 LAB — PROTIME-INR
INR: 1.05 (ref 0.00–1.49)
PROTHROMBIN TIME: 13.7 s (ref 11.6–15.2)

## 2013-11-20 MED ORDER — ONDANSETRON HCL 4 MG PO TABS: 4.0000 mg | ORAL_TABLET | Freq: Four times a day (QID) | ORAL | Status: DC

## 2013-11-20 MED ORDER — HYDROMORPHONE HCL PF 1 MG/ML IJ SOLN
1.0000 mg | INTRAMUSCULAR | Status: AC | PRN
  Administered 2013-11-20 (×2): 1 mg via INTRAVENOUS
  Filled 2013-11-20 (×2): qty 1

## 2013-11-20 MED ORDER — SODIUM CHLORIDE 0.9 % IV SOLN
Freq: Once | INTRAVENOUS | Status: AC
  Administered 2013-11-20: 14:00:00 via INTRAVENOUS

## 2013-11-20 MED ORDER — SODIUM CHLORIDE 0.9 % IV BOLUS (SEPSIS)
500.0000 mL | Freq: Once | INTRAVENOUS | Status: AC
  Administered 2013-11-20: 500 mL via INTRAVENOUS

## 2013-11-20 MED ORDER — ONDANSETRON HCL 4 MG/2ML IJ SOLN
4.0000 mg | Freq: Once | INTRAMUSCULAR | Status: AC
  Administered 2013-11-20: 4 mg via INTRAVENOUS
  Filled 2013-11-20: qty 2

## 2013-11-20 MED ORDER — TAMSULOSIN HCL 0.4 MG PO CAPS: 0.4000 mg | ORAL_CAPSULE | Freq: Every day | ORAL | Status: DC

## 2013-11-20 MED ORDER — HYDROCODONE-ACETAMINOPHEN 5-325 MG PO TABS
1.0000 | ORAL_TABLET | ORAL | Status: DC | PRN
Start: 2013-11-20 — End: 2022-09-26

## 2013-11-20 NOTE — Discharge Instructions (Signed)
Her pain today is from the passage of a kidney stone. Recheck with urology in 7 days if your pain is not completely resolved. You have gallstones which are not causing her symptoms today. If you begin to have right upper abdominal pain, recheck with your primary care physician.  Kidney Stones Kidney stones (urolithiasis) are deposits that form inside your kidneys. The intense pain is caused by the stone moving through the urinary tract. When the stone moves, the ureter goes into spasm around the stone. The stone is usually passed in the urine.  CAUSES   A disorder that makes certain neck glands produce too much parathyroid hormone (primary hyperparathyroidism).  A buildup of uric acid crystals, similar to gout in your joints.  Narrowing (stricture) of the ureter.  A kidney obstruction present at birth (congenital obstruction).  Previous surgery on the kidney or ureters.  Numerous kidney infections. SYMPTOMS   Feeling sick to your stomach (nauseous).  Throwing up (vomiting).  Blood in the urine (hematuria).  Pain that usually spreads (radiates) to the groin.  Frequency or urgency of urination. DIAGNOSIS   Taking a history and physical exam.  Blood or urine tests.  CT scan.  Occasionally, an examination of the inside of the urinary bladder (cystoscopy) is performed. TREATMENT   Observation.  Increasing your fluid intake.  Extracorporeal shock wave lithotripsy--This is a noninvasive procedure that uses shock waves to break up kidney stones.  Surgery may be needed if you have severe pain or persistent obstruction. There are various surgical procedures. Most of the procedures are performed with the use of small instruments. Only small incisions are needed to accommodate these instruments, so recovery time is minimized. The size, location, and chemical composition are all important variables that will determine the proper choice of action for you. Talk to your health care  provider to better understand your situation so that you will minimize the risk of injury to yourself and your kidney.  HOME CARE INSTRUCTIONS   Drink enough water and fluids to keep your urine clear or pale yellow. This will help you to pass the stone or stone fragments.  Strain all urine through the provided strainer. Keep all particulate matter and stones for your health care provider to see. The stone causing the pain may be as small as a grain of salt. It is very important to use the strainer each and every time you pass your urine. The collection of your stone will allow your health care provider to analyze it and verify that a stone has actually passed. The stone analysis will often identify what you can do to reduce the incidence of recurrences.  Only take over-the-counter or prescription medicines for pain, discomfort, or fever as directed by your health care provider.  Make a follow-up appointment with your health care provider as directed.  Get follow-up X-rays if required. The absence of pain does not always mean that the stone has passed. It may have only stopped moving. If the urine remains completely obstructed, it can cause loss of kidney function or even complete destruction of the kidney. It is your responsibility to make sure X-rays and follow-ups are completed. Ultrasounds of the kidney can show blockages and the status of the kidney. Ultrasounds are not associated with any radiation and can be performed easily in a matter of minutes. SEEK MEDICAL CARE IF:  You experience pain that is progressive and unresponsive to any pain medicine you have been prescribed. SEEK IMMEDIATE MEDICAL CARE IF:  Pain cannot be controlled with the prescribed medicine.  You have a fever or shaking chills.  The severity or intensity of pain increases over 18 hours and is not relieved by pain medicine.  You develop a new onset of abdominal pain.  You feel faint or pass out.  You are unable to  urinate. MAKE SURE YOU:   Understand these instructions.  Will watch your condition.  Will get help right away if you are not doing well or get worse. Document Released: 04/03/2005 Document Revised: 12/04/2012 Document Reviewed: 09/04/2012 [facility] Patient Information 2015 [place], [place]. This information is not intended to replace advice given to you by your health care provider. Make sure you discuss any questions you have with your health care provider.  Cholelithiasis Cholelithiasis (also called gallstones) is a form of gallbladder disease. The gallbladder is a small organ that helps you digest fats. Symptoms of gallstones are:  Feeling sick to your stomach (nausea).  Throwing up (vomiting).  Belly pain.  Yellowing of the skin (jaundice).  Sudden pain. You may feel the pain for minutes to hours.  Fever.  Pain to the touch. HOME CARE  Only take medicines as told by your doctor.  Eat a low-fat diet until you see your doctor again. Eating fat can result in pain.  Follow up with your doctor as told. Attacks usually happen time after time. Surgery is usually needed for permanent treatment. GET HELP RIGHT AWAY IF:   Your pain gets worse.  Your pain is not helped by medicines.  You have a fever and lasting symptoms for more than 2-3 days.  You have a fever and your symptoms suddenly get worse.  You keep feeling sick to your stomach and throwing up. MAKE SURE YOU:   Understand these instructions.  Will watch your condition.  Will get help right away if you are not doing well or get worse. Document Released: 09/20/2007 Document Revised: 12/04/2012 Document Reviewed: 09/25/2012 [facility] Patient Information 2015 [place], [place]. This information is not intended to replace advice given to you by your health care provider. Make sure you discuss any questions you have with your health care provider.

## 2013-11-20 NOTE — ED Notes (Signed)
Patient to CT.

## 2013-11-20 NOTE — ED Provider Notes (Signed)
CSN: [id number]     Arrival date & time 11/20/13  1230 History   First MD Initiated Contact with Patient 11/20/13 1253     Chief Complaint  Patient presents with  . Abdominal Pain     HPI  Patient presents for abdominal pain. Colonoscopy is a TIA. He took his without difficulty prior to his procedure. Procedure was without difficulty. He had 2 polyps extracted. No bleeding. He has a sudden cramping abdominal pain feels like he needs to have a bowel movement and cannot. No history of kidney stones no biliary disease. No fevers no chills.  Past Medical History  Diagnosis Date  . Hypertension   . Arthritis   . History of heart artery stent   . Appendicitis   . Hepatitis C infection     not treated - pending appointment in april 2015  . Anemia   . B12 nutritional deficiency     on supplement  . Trouble in sleeping   . Presbyopia     wears bifocals  . GERD (gastroesophageal reflux disease)    Past Surgical History  Procedure Laterality Date  . Total knee arthroplasty Bilateral   . Right shoulder surgery    . Left shoulder surgery    . Appendectomy     Family History  Problem Relation Age of Onset  . Coronary artery disease Mother   . Thyroid disease Mother   . Heart disease Mother   . Coronary artery disease Father   . Heart disease Father    History  Substance Use Topics  . Smoking status: Former Smoker    Quit date: 09/14/2001  . Smokeless tobacco: Never Used  . Alcohol Use: 4.2 oz/week    7 Cans of beer per week     Comment: approx 2 beers two to three times per week, 7 cans/week    Review of Systems  Constitutional: Negative for fever, chills, diaphoresis, appetite change and fatigue.  HENT: Negative for mouth sores, sore throat and trouble swallowing.   Eyes: Negative for visual disturbance.  Respiratory: Negative for cough, chest tightness, shortness of breath and wheezing.   Cardiovascular: Negative for chest pain.  Gastrointestinal: Positive for abdominal  pain and constipation. Negative for nausea, vomiting, diarrhea and abdominal distention.  Endocrine: Negative for polydipsia, polyphagia and polyuria.  Genitourinary: Negative for dysuria, frequency and hematuria.  Musculoskeletal: Negative for gait problem.  Skin: Negative for color change, pallor and rash.  Neurological: Negative for dizziness, syncope, light-headedness and headaches.  Hematological: Does not bruise/bleed easily.  Psychiatric/Behavioral: Negative for behavioral problems and confusion.      Allergies  Review of patient's allergies indicates no known allergies.  Home Medications   Prior to Admission medications   Medication Sig Start Date End Date Taking? Authorizing Provider  acetaminophen (TYLENOL) 325 MG tablet Take 325 mg by mouth every 6 (six) hours as needed for moderate pain or fever.   Yes Historical Provider, MD  allopurinol (ZYLOPRIM) 300 MG tablet Take 300 mg by mouth daily.   Yes Historical Provider, MD  amLODipine-benazepril (LOTREL) 10-20 MG per capsule Take 1 capsule by mouth daily.   Yes Historical Provider, MD  aspirin EC 81 MG tablet Take 81 mg by mouth daily.   Yes Historical Provider, MD  atorvastatin (LIPITOR) 80 MG tablet Take 80 mg by mouth daily.   Yes Historical Provider, MD  capsicum (ZOSTRIX) 0.075 % topical cream Apply 1 application topically 2 (two) times daily as needed. For pain   Yes  Historical Provider, MD  cetirizine (ZYRTEC) 10 MG tablet Take 1 tablet (10 mg total) by mouth daily. 09/26/13  Yes Belkys A Regalado, MD  colchicine 0.6 MG tablet Take 0.6 mg by mouth daily as needed. For gout   Yes Historical Provider, MD  gabapentin (NEURONTIN) 300 MG capsule Take 300 mg by mouth 3 (three) times daily.   Yes Historical Provider, MD  HYDROXYZINE PAMOATE PO Take 25 mg by mouth at bedtime as needed. For sleep   Yes Historical Provider, MD  ketoconazole (NIZORAL) 2 % cream Apply 1 application topically 2 (two) times daily.   Yes Historical  Provider, MD  losartan (COZAAR) 50 MG tablet Take 50 mg by mouth daily.   Yes Historical Provider, MD  metoprolol tartrate (LOPRESSOR) 25 MG tablet Take 25 mg by mouth daily.    Yes Historical Provider, MD  naproxen (NAPROSYN) 500 MG tablet Take 500 mg by mouth 2 (two) times daily as needed for moderate pain.   Yes Historical Provider, MD  omeprazole (PRILOSEC) 20 MG capsule Take 20 mg by mouth daily.   Yes Historical Provider, MD  sertraline (ZOLOFT) 100 MG tablet Take 100 mg by mouth daily.   Yes Historical Provider, MD  vitamin B-12 (CYANOCOBALAMIN) 1000 MCG tablet Take 1,000 mcg by mouth daily.   Yes Historical Provider, MD  HYDROcodone-acetaminophen (NORCO/VICODIN) 5-325 MG per tablet Take 1 tablet by mouth every 4 (four) hours as needed. 11/20/13   Rolland Porter, MD  ondansetron (ZOFRAN) 4 MG tablet Take 1 tablet (4 mg total) by mouth every 6 (six) hours. 11/20/13   Rolland Porter, MD  tamsulosin (FLOMAX) 0.4 MG CAPS capsule Take 1 capsule (0.4 mg total) by mouth daily. 11/20/13   Rolland Porter, MD   BP 186/73  Pulse 68  Temp(Src) 98 F (36.7 C) (Oral)  Resp 18  SpO2 99% Physical Exam  Constitutional: He is oriented to person, place, and time. He appears well-developed and well-nourished. No distress.  HENT:  Head: Normocephalic.  Eyes: Conjunctivae are normal. Pupils are equal, round, and reactive to light. No scleral icterus.  Neck: Normal range of motion. Neck supple. No thyromegaly present.  Cardiovascular: Normal rate and regular rhythm.  Exam reveals no gallop and no friction rub.   No murmur heard. Pulmonary/Chest: Effort normal and breath sounds normal. No respiratory distress. He has no wheezes. He has no rales.  Abdominal: Soft. Bowel sounds are normal. He exhibits no distension. There is no tenderness. There is no rebound.    Musculoskeletal: Normal range of motion.  Neurological: He is alert and oriented to person, place, and time.  Skin: Skin is warm and dry. No rash noted.    Psychiatric: He has a normal mood and affect. His behavior is normal.    ED Course  Procedures (including critical care time) Labs Review Labs Reviewed  CBC WITH DIFFERENTIAL - Abnormal; Notable for the following:    Neutrophils Relative % 78 (*)    All other components within normal limits  COMPREHENSIVE METABOLIC PANEL - Abnormal; Notable for the following:    Sodium 135 (*)    Potassium 3.6 (*)    Glucose, Bld 173 (*)    Total Protein 8.7 (*)    AST 43 (*)    GFR calc non Af Amer 85 (*)    All other components within normal limits  PROTIME-INR  URINALYSIS, ROUTINE W REFLEX MICROSCOPIC  I-STAT CG4 LACTIC ACID, ED    Imaging Review Ct Abdomen Pelvis Wo Contrast  11/20/2013   CLINICAL DATA:  Sharp abdominal pain after colonoscopy.  EXAM: CT ABDOMEN AND PELVIS WITHOUT CONTRAST  TECHNIQUE: Multidetector CT imaging of the abdomen and pelvis was performed following the standard protocol without IV contrast.  COMPARISON:  Chest CT 05/13/2013  FINDINGS: Again noted are subpleural densities at the lung bases which are similar to the previous examination and likely represent fibrosis. There is no evidence for free intraperitoneal air.  There are multiple calcified gallstones and the largest measures 2.5 cm. There appears to be mild gallbladder wall distension and cannot exclude gallbladder wall thickening or pericholecystic fluid on sequence 2, image 28. No evidence for biliary dilatation. Normal appearance of the spleen, pancreas and adrenal glands.  There is a 0.6 cm stone in the distal right ureter and there is moderate right hydroureteronephrosis. Normal appearance of left kidney without stones or hydronephrosis.  The abdominal aorta has calcifications without aneurysm. No significant free fluid or lymphadenopathy. No gross abnormality to the prostate or urinary bladder. No acute abnormality within the small or large bowel.  Degenerative changes in the right hip with subchondral cyst formations  along the posterior right acetabulum. Degenerative facet disease in the lumbar spine. Disc space narrowing at L5-S1 and L4-L5.  IMPRESSION: Moderate right hydroureteronephrosis due to a 0.6 cm stone in the distal right ureter.  Multiple gallstones. There is concern for mild gallbladder wall distention and cannot exclude pericholecystic fluid or gallbladder wall thickening. Recommend further evaluation of the gallbladder with an ultrasound.   Electronically Signed   By: Richarda Overlie M.D.   On: 11/20/2013 14:19     EKG Interpretation None      MDM   Final diagnoses:  Ureteral colic    CT shows no sign of complications secondary to this procedure. No sign of retroperitoneal hemorrhage, splenic abnormality, or free air. He has a 6 mm right distal ureteral stone with hydronephrosis. This is his area of primary discomfort. He has passed gas and had a bowel movement in the emergency room. He urinates. No blood in his ureter signs of infection. Large multiple gallstones. However, nontender in the upper abdomen. No leukocytosis, no abnormal hepatobiliary enzymes. Plan is discharge home hydration and symptomatic treatment for his ureteral stone with Flomax, pain medication Zofran, urology followup 7 days if not resolved.    Rolland Porter, MD 11/21/13 [phone]

## 2013-11-20 NOTE — ED Notes (Signed)
Pt had colonoscopy yesterday at [facility].  Had 2 polyps removed.  Pt is unable to pass gas or have bm.  Having sharp abdominal pain.  No vomiting.  Pt denies bloody discharge.

## 2014-11-04 ENCOUNTER — Emergency Department (HOSPITAL_COMMUNITY)
Admission: EM | Admit: 2014-11-04 | Discharge: 2014-11-04 | Disposition: A | Discharge status: Home / Self Care | Payer: Non-veteran care | Attending: Emergency Medicine | Admitting: Emergency Medicine

## 2014-11-04 ENCOUNTER — Emergency Department (HOSPITAL_COMMUNITY): Payer: Non-veteran care

## 2014-11-04 ENCOUNTER — Encounter (HOSPITAL_COMMUNITY): Payer: Self-pay | Admitting: Emergency Medicine

## 2014-11-04 DIAGNOSIS — M1990 Unspecified osteoarthritis, unspecified site: Secondary | ICD-10-CM | POA: Insufficient documentation

## 2014-11-04 DIAGNOSIS — Z955 Presence of coronary angioplasty implant and graft: Secondary | ICD-10-CM | POA: Diagnosis not present

## 2014-11-04 DIAGNOSIS — E538 Deficiency of other specified B group vitamins: Secondary | ICD-10-CM | POA: Insufficient documentation

## 2014-11-04 DIAGNOSIS — Z9089 Acquired absence of other organs: Secondary | ICD-10-CM | POA: Insufficient documentation

## 2014-11-04 DIAGNOSIS — Z8669 Personal history of other diseases of the nervous system and sense organs: Secondary | ICD-10-CM | POA: Insufficient documentation

## 2014-11-04 DIAGNOSIS — Z862 Personal history of diseases of the blood and blood-forming organs and certain disorders involving the immune mechanism: Secondary | ICD-10-CM | POA: Insufficient documentation

## 2014-11-04 DIAGNOSIS — K5900 Constipation, unspecified: Secondary | ICD-10-CM

## 2014-11-04 DIAGNOSIS — Z8619 Personal history of other infectious and parasitic diseases: Secondary | ICD-10-CM | POA: Insufficient documentation

## 2014-11-04 DIAGNOSIS — Z7982 Long term (current) use of aspirin: Secondary | ICD-10-CM | POA: Insufficient documentation

## 2014-11-04 DIAGNOSIS — Z79899 Other long term (current) drug therapy: Secondary | ICD-10-CM | POA: Diagnosis not present

## 2014-11-04 DIAGNOSIS — I10 Essential (primary) hypertension: Secondary | ICD-10-CM | POA: Diagnosis not present

## 2014-11-04 DIAGNOSIS — R002 Palpitations: Secondary | ICD-10-CM | POA: Insufficient documentation

## 2014-11-04 DIAGNOSIS — Z87891 Personal history of nicotine dependence: Secondary | ICD-10-CM | POA: Diagnosis not present

## 2014-11-04 DIAGNOSIS — K219 Gastro-esophageal reflux disease without esophagitis: Secondary | ICD-10-CM | POA: Diagnosis not present

## 2014-11-04 LAB — CBC WITH DIFFERENTIAL/PLATELET
BASOS ABS: 0 10*3/uL (ref 0.0–0.1)
Basophils Relative: 0 % (ref 0–1)
Eosinophils Absolute: 0 10*3/uL (ref 0.0–0.7)
Eosinophils Relative: 0 % (ref 0–5)
HCT: 40.6 % (ref 39.0–52.0)
Hemoglobin: 13.9 g/dL (ref 13.0–17.0)
Lymphocytes Relative: 18 % (ref 12–46)
Lymphs Abs: 1.8 10*3/uL (ref 0.7–4.0)
MCH: 31.4 pg (ref 26.0–34.0)
MCHC: 34.2 g/dL (ref 30.0–36.0)
MCV: 91.9 fL (ref 78.0–100.0)
Monocytes Absolute: 1.2 10*3/uL — ABNORMAL HIGH (ref 0.1–1.0)
Monocytes Relative: 12 % (ref 3–12)
NEUTROS ABS: 7.1 10*3/uL (ref 1.7–7.7)
NEUTROS PCT: 70 % (ref 43–77)
PLATELETS: 264 10*3/uL (ref 150–400)
RBC: 4.42 MIL/uL (ref 4.22–5.81)
RDW: 14 % (ref 11.5–15.5)
WBC: 10.1 10*3/uL (ref 4.0–10.5)

## 2014-11-04 LAB — URINALYSIS, ROUTINE W REFLEX MICROSCOPIC
Bilirubin Urine: NEGATIVE
Glucose, UA: NEGATIVE mg/dL
HGB URINE DIPSTICK: NEGATIVE
KETONES UR: NEGATIVE mg/dL
Leukocytes, UA: NEGATIVE
NITRITE: NEGATIVE
PROTEIN: 30 mg/dL — AB
Specific Gravity, Urine: 1.025 (ref 1.005–1.030)
Urobilinogen, UA: 0.2 mg/dL (ref 0.0–1.0)
pH: 6 (ref 5.0–8.0)

## 2014-11-04 LAB — I-STAT CHEM 8, ED
BUN: 10 mg/dL (ref 6–20)
CREATININE: 0.8 mg/dL (ref 0.61–1.24)
Calcium, Ion: 1.2 mmol/L (ref 1.13–1.30)
Chloride: 98 mmol/L — ABNORMAL LOW (ref 101–111)
Glucose, Bld: 141 mg/dL — ABNORMAL HIGH (ref 65–99)
HCT: 46 % (ref 39.0–52.0)
HEMOGLOBIN: 15.6 g/dL (ref 13.0–17.0)
Potassium: 3.6 mmol/L (ref 3.5–5.1)
Sodium: 137 mmol/L (ref 135–145)
TCO2: 27 mmol/L (ref 0–100)

## 2014-11-04 LAB — URINE MICROSCOPIC-ADD ON

## 2014-11-04 LAB — I-STAT TROPONIN, ED: Troponin i, poc: 0.01 ng/mL (ref 0.00–0.08)

## 2014-11-04 MED ORDER — ONDANSETRON 8 MG PO TBDP
8.0000 mg | ORAL_TABLET | Freq: Once | ORAL | Status: AC
  Administered 2014-11-04: 8 mg via ORAL
  Filled 2014-11-04: qty 1

## 2014-11-04 MED ORDER — OMEPRAZOLE 20 MG PO CPDR: 20.0000 mg | DELAYED_RELEASE_CAPSULE | Freq: Every day | ORAL | Status: DC

## 2014-11-04 MED ORDER — POLYETHYLENE GLYCOL 3350 17 G PO PACK
17.0000 g | PACK | Freq: Every day | ORAL | Status: DC
  Administered 2014-11-04: 17 g via ORAL
  Filled 2014-11-04: qty 1

## 2014-11-04 MED ORDER — POLYETHYLENE GLYCOL 3350 17 GM/SCOOP PO POWD: 17.0000 g | Freq: Every day | ORAL | Status: DC

## 2014-11-04 NOTE — Discharge Instructions (Signed)

## 2014-11-04 NOTE — ED Provider Notes (Signed)
CSN: [id number]     Arrival date & time 11/04/14  5102 History   First MD Initiated Contact with Patient 11/04/14 [id number]     Chief Complaint  Patient presents with  . Palpitations  . Constipation     (Consider location/radiation/quality/duration/timing/severity/associated sxs/prior Treatment) Patient is a 74 y.o. male presenting with palpitations and constipation. The history is provided by the patient. No language interpreter was used.  Palpitations Palpitations quality:  Regular Onset quality:  Chronic Timing:  Constant Progression:  Unchanged Chronicity:  New Context: not blood loss   Relieved by:  Nothing Exacerbated by: not taking his meds. Ineffective treatments:  None tried Associated symptoms: no chest pain, no diaphoresis and no shortness of breath   Associated symptoms comment:  Constipations Risk factors: no hx of PE   Constipation Severity:  Moderate Time since last bowel movement:  5 days Timing:  Constant Progression:  Unchanged Chronicity:  New Context: medication   Stool description:  None produced Relieved by:  Nothing Worsened by:  Nothing tried Ineffective treatments:  Stool softeners Associated symptoms: no abdominal pain, no fever and no urinary retention   Risk factors: no recent illness     Past Medical History  Diagnosis Date  . Hypertension   . Arthritis   . History of heart artery stent   . Appendicitis   . Hepatitis C infection     not treated - pending appointment in [place] 2015  . Anemia   . B12 nutritional deficiency     on supplement  . Trouble in sleeping   . Presbyopia     wears bifocals  . GERD (gastroesophageal reflux disease)    Past Surgical History  Procedure Laterality Date  . Total knee arthroplasty Bilateral   . Right shoulder surgery    . Left shoulder surgery    . Appendectomy     Family History  Problem Relation Age of Onset  . Coronary artery disease Mother   . Thyroid disease Mother   . Heart disease Mother    . Coronary artery disease Father   . Heart disease Father    History  Substance Use Topics  . Smoking status: Former Smoker    Quit date: 09/14/2001  . Smokeless tobacco: Never Used  . Alcohol Use: No     Comment: former    Review of Systems  Constitutional: Negative for fever and diaphoresis.  Respiratory: Negative for chest tightness and shortness of breath.   Cardiovascular: Positive for palpitations. Negative for chest pain and leg swelling.  Gastrointestinal: Positive for constipation. Negative for abdominal pain.  All other systems reviewed and are negative.     Allergies  Review of patient's allergies indicates no known allergies.  Home Medications   Prior to Admission medications   Medication Sig Start Date End Date Taking? Authorizing Provider  cetirizine (ZYRTEC) 10 MG tablet Take 1 tablet (10 mg total) by mouth daily. 09/26/13  Yes Belkys A Regalado, MD  HYDROcodone-acetaminophen (NORCO/VICODIN) 5-325 MG per tablet Take 1 tablet by mouth every 4 (four) hours as needed. 11/20/13  Yes Rolland Porter, MD  acetaminophen (TYLENOL) 325 MG tablet Take 325 mg by mouth every 6 (six) hours as needed for moderate pain or fever.    Historical Provider, MD  allopurinol (ZYLOPRIM) 300 MG tablet Take 300 mg by mouth daily.    Historical Provider, MD  amLODipine-benazepril (LOTREL) 10-20 MG per capsule Take 1 capsule by mouth daily.    Historical Provider, MD  aspirin EC 81  MG tablet Take 81 mg by mouth daily.    Historical Provider, MD  atorvastatin (LIPITOR) 80 MG tablet Take 80 mg by mouth daily.    Historical Provider, MD  capsicum (ZOSTRIX) 0.075 % topical cream Apply 1 application topically 2 (two) times daily as needed. For pain    Historical Provider, MD  colchicine 0.6 MG tablet Take 0.6 mg by mouth daily as needed. For gout    Historical Provider, MD  gabapentin (NEURONTIN) 300 MG capsule Take 300 mg by mouth 3 (three) times daily.    Historical Provider, MD  HYDROXYZINE  PAMOATE PO Take 25 mg by mouth at bedtime as needed. For sleep    Historical Provider, MD  ketoconazole (NIZORAL) 2 % cream Apply 1 application topically 2 (two) times daily.    Historical Provider, MD  losartan (COZAAR) 50 MG tablet Take 50 mg by mouth daily.    Historical Provider, MD  metoprolol tartrate (LOPRESSOR) 25 MG tablet Take 25 mg by mouth daily.     Historical Provider, MD  naproxen (NAPROSYN) 500 MG tablet Take 500 mg by mouth 2 (two) times daily as needed for moderate pain.    Historical Provider, MD  omeprazole (PRILOSEC) 20 MG capsule Take 20 mg by mouth daily.    Historical Provider, MD  ondansetron (ZOFRAN) 4 MG tablet Take 1 tablet (4 mg total) by mouth every 6 (six) hours. 11/20/13   Rolland Porter, MD  sertraline (ZOLOFT) 100 MG tablet Take 100 mg by mouth daily.    Historical Provider, MD  tamsulosin (FLOMAX) 0.4 MG CAPS capsule Take 1 capsule (0.4 mg total) by mouth daily. 11/20/13   Rolland Porter, MD  vitamin B-12 (CYANOCOBALAMIN) 1000 MCG tablet Take 1,000 mcg by mouth daily.    Historical Provider, MD   BP 136/71 mmHg  Pulse 94  Temp(Src) 98.4 F (36.9 C) (Oral)  Resp 18  Ht 5\' 11"  (1.803 m)  Wt 168 lb (76.204 kg)  BMI 23.44 kg/m2  SpO2 100% Physical Exam  Constitutional: He is oriented to person, place, and time. He appears well-developed and well-nourished. No distress.  HENT:  Head: Normocephalic and atraumatic.  Mouth/Throat: Oropharynx is clear and moist.  Eyes: Conjunctivae and EOM are normal. Pupils are equal, round, and reactive to light.  Neck: Normal range of motion. Neck supple. No thyromegaly present.  Cardiovascular: Normal rate, regular rhythm and intact distal pulses.   Pulmonary/Chest: Effort normal and breath sounds normal. No respiratory distress. He has no wheezes. He has no rales.  Abdominal: Soft. Bowel sounds are increased. There is no tenderness. There is no rebound and no guarding.  Musculoskeletal: Normal range of motion.  Neurological: He is  alert and oriented to person, place, and time. He has normal reflexes.  Skin: Skin is warm and dry.  Psychiatric: He has a normal mood and affect.    ED Course  Procedures (including critical care time) Labs Review Labs Reviewed  CBC WITH DIFFERENTIAL/PLATELET - Abnormal; Notable for the following:    Monocytes Absolute 1.2 (*)    All other components within normal limits  I-STAT CHEM 8, ED - Abnormal; Notable for the following:    Chloride 98 (*)    Glucose, Bld 141 (*)    All other components within normal limits  URINALYSIS, ROUTINE W REFLEX MICROSCOPIC (NOT AT [facility])    Imaging Review No results found.   EKG Interpretation   Date/Time:  Wednesday November 04 2014 03:26:13 EDT Ventricular Rate:  94 PR Interval:  122 QRS Duration: 78 QT Interval:  323 QTC Calculation: 404 R Axis:   71 Text Interpretation:  Sinus rhythm Probable left atrial enlargement Left  ventricular hypertrophy Confirmed by [facility]  MD, Morene Antu ([id number]) on  11/04/2014 3:52:36 AM      MDM   Final diagnoses:  Constipation  Exam and vitals are benign and reassuring.    Will start miralax QAM and have advised eating a normal diet and restarting home medication and close follow up with your PMD    Cythina Mickelsen, MD 11/04/14 [phone]

## 2014-11-04 NOTE — ED Notes (Signed)
Pt states earlier tonight he felt like his heart was not beating regularly  Pt has hx of stent placement about 6 years ago   Pt states he also is constipated and has not had a bowel movement since Saturday  Pt states he has taken laxatives and that has made him vomit  Pt states he is passing gas but not on the regular basis

## 2014-11-04 NOTE — ED Notes (Signed)
Pt states he stopped taking all of his medication Saturday because he become constipated,

## 2014-11-04 NOTE — ED Notes (Signed)
Pt resting quietly; no complaints

## 2014-11-16 IMAGING — CR DG LUMBAR SPINE 2-3V
3 series · 3 of 3 positions shown · non-contrast
Comparison: None.

CLINICAL DATA: Low back pain

EXAM:
LUMBAR SPINE - 2-3 VIEW

[AP]
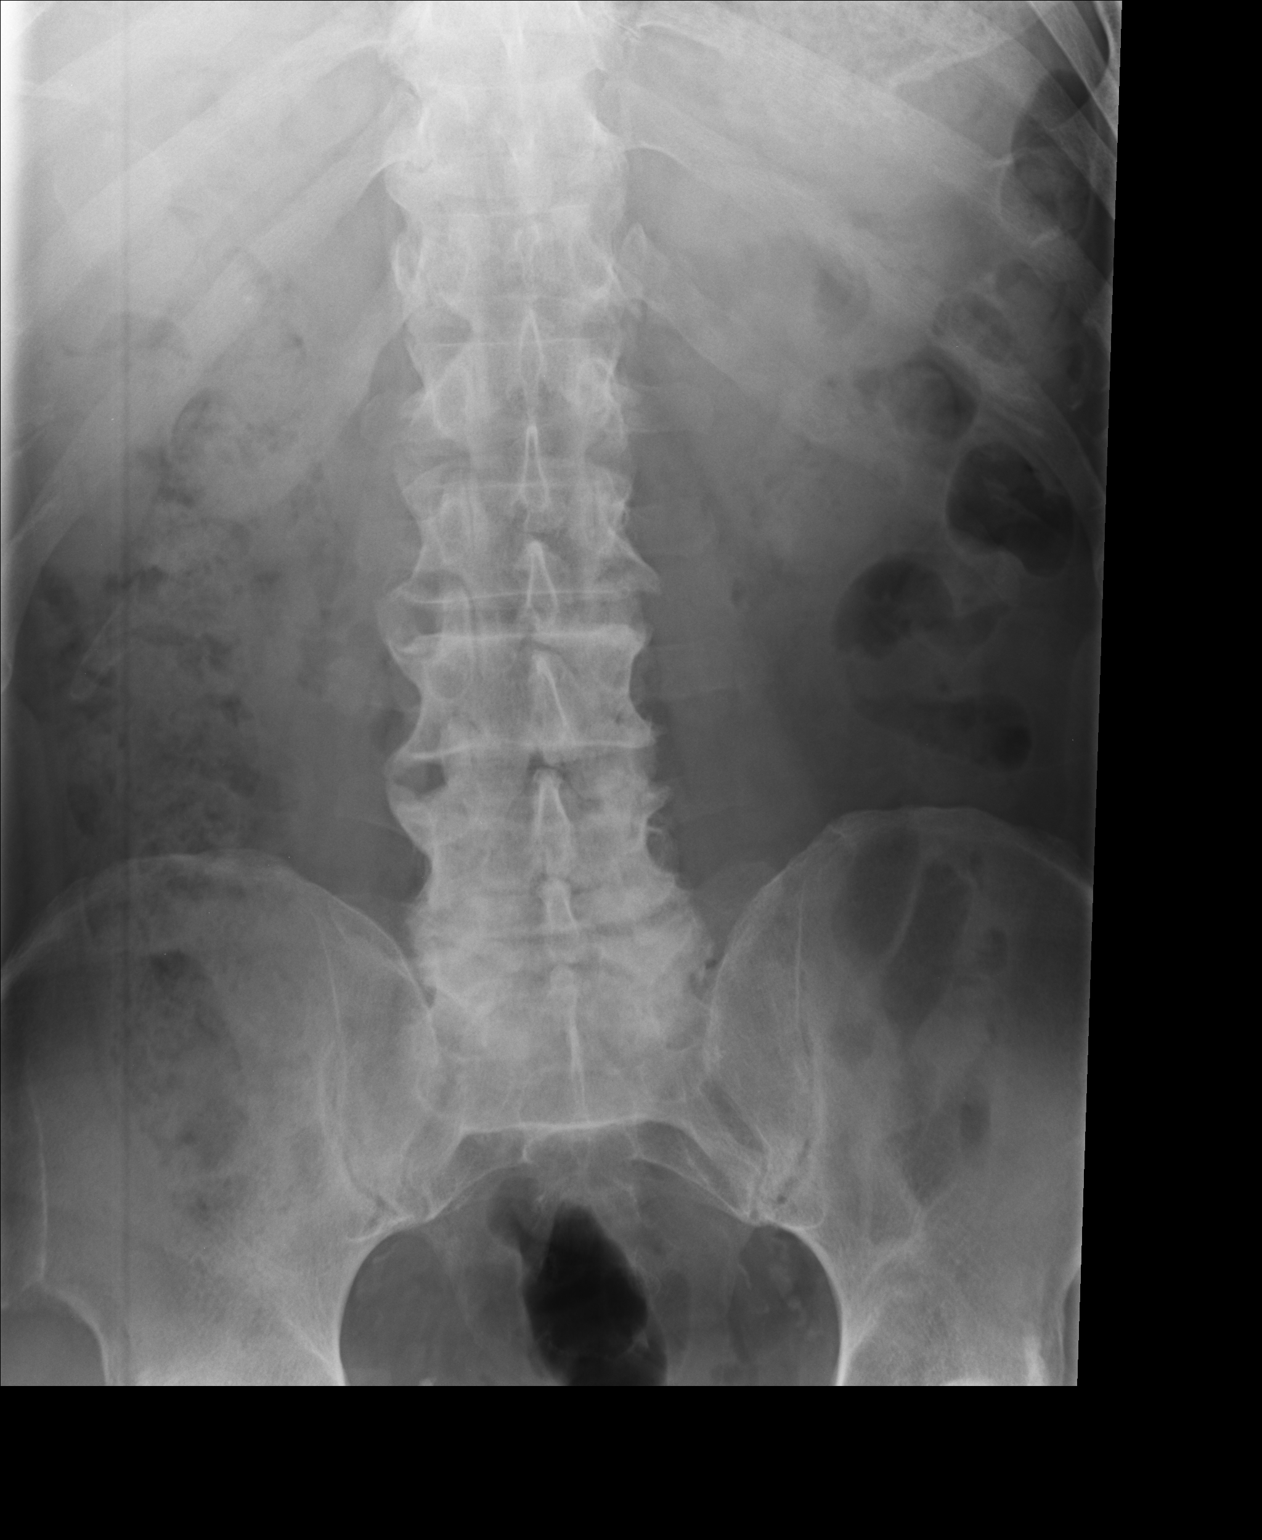

[lateral]
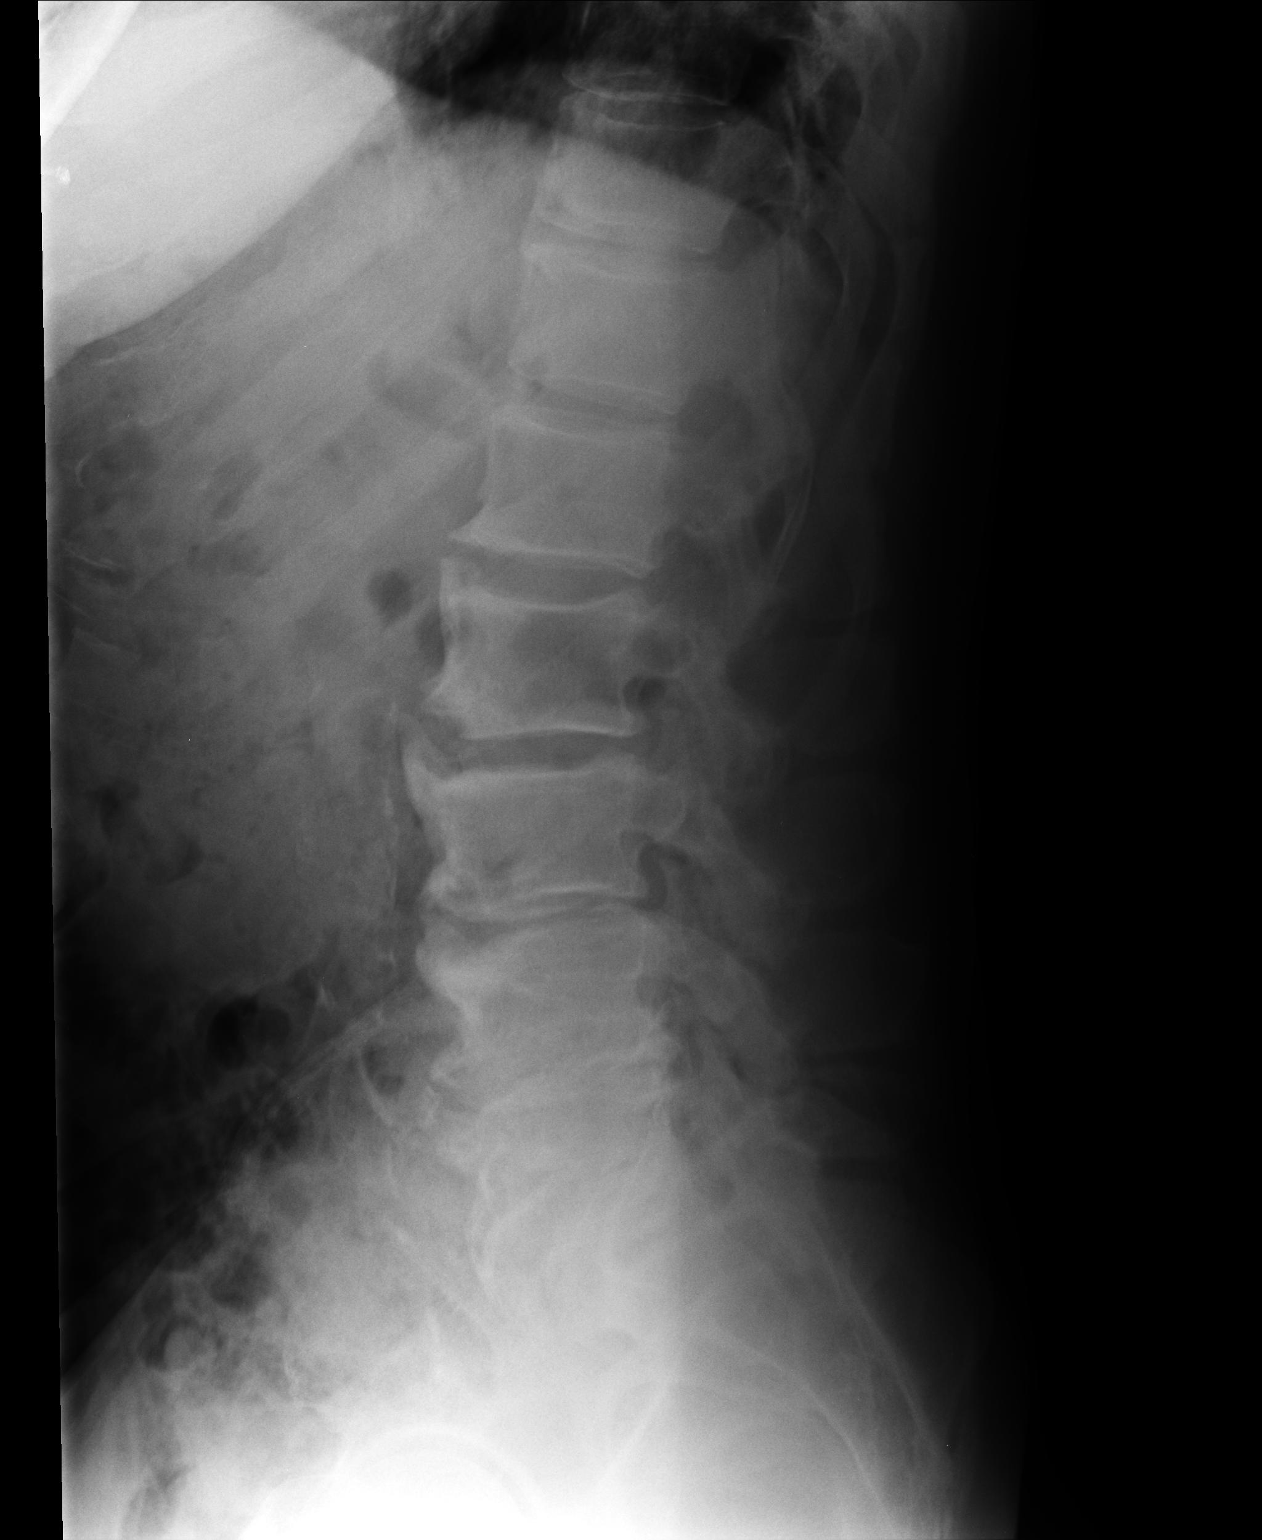

[l5 s1]
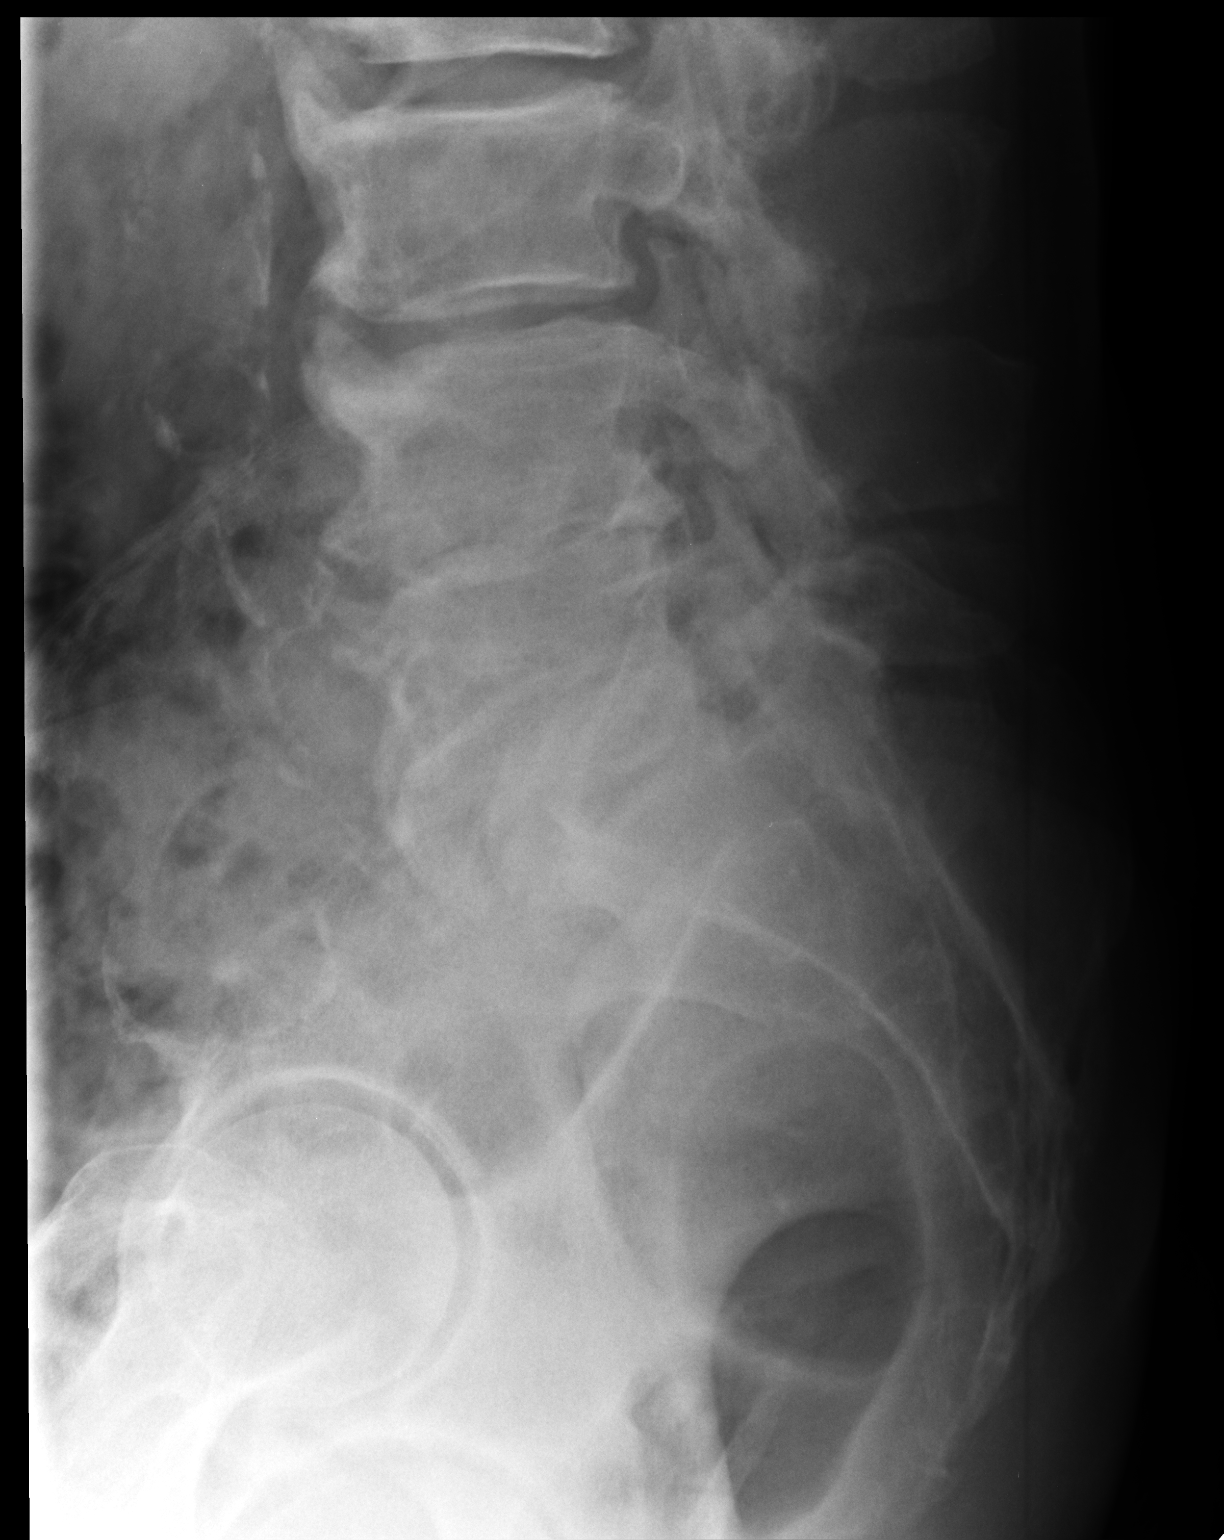

[3 of 3 positions shown; findings below may reference images not displayed]

FINDINGS: No acute compression deformity is noted. Multilevel osteophytic
changes and disc space narrowing are noted. No acute abnormality is
seen.
IMPRESSION: Degenerative change without acute abnormality.

## 2014-11-16 IMAGING — CR DG HIP COMPLETE 2+V*R*
2 series · 2 of 2 positions shown · non-contrast
Comparison: None.

CLINICAL DATA: Right hip pain following injury

EXAM:
RIGHT HIP - COMPLETE 2+ VIEW

[AP]
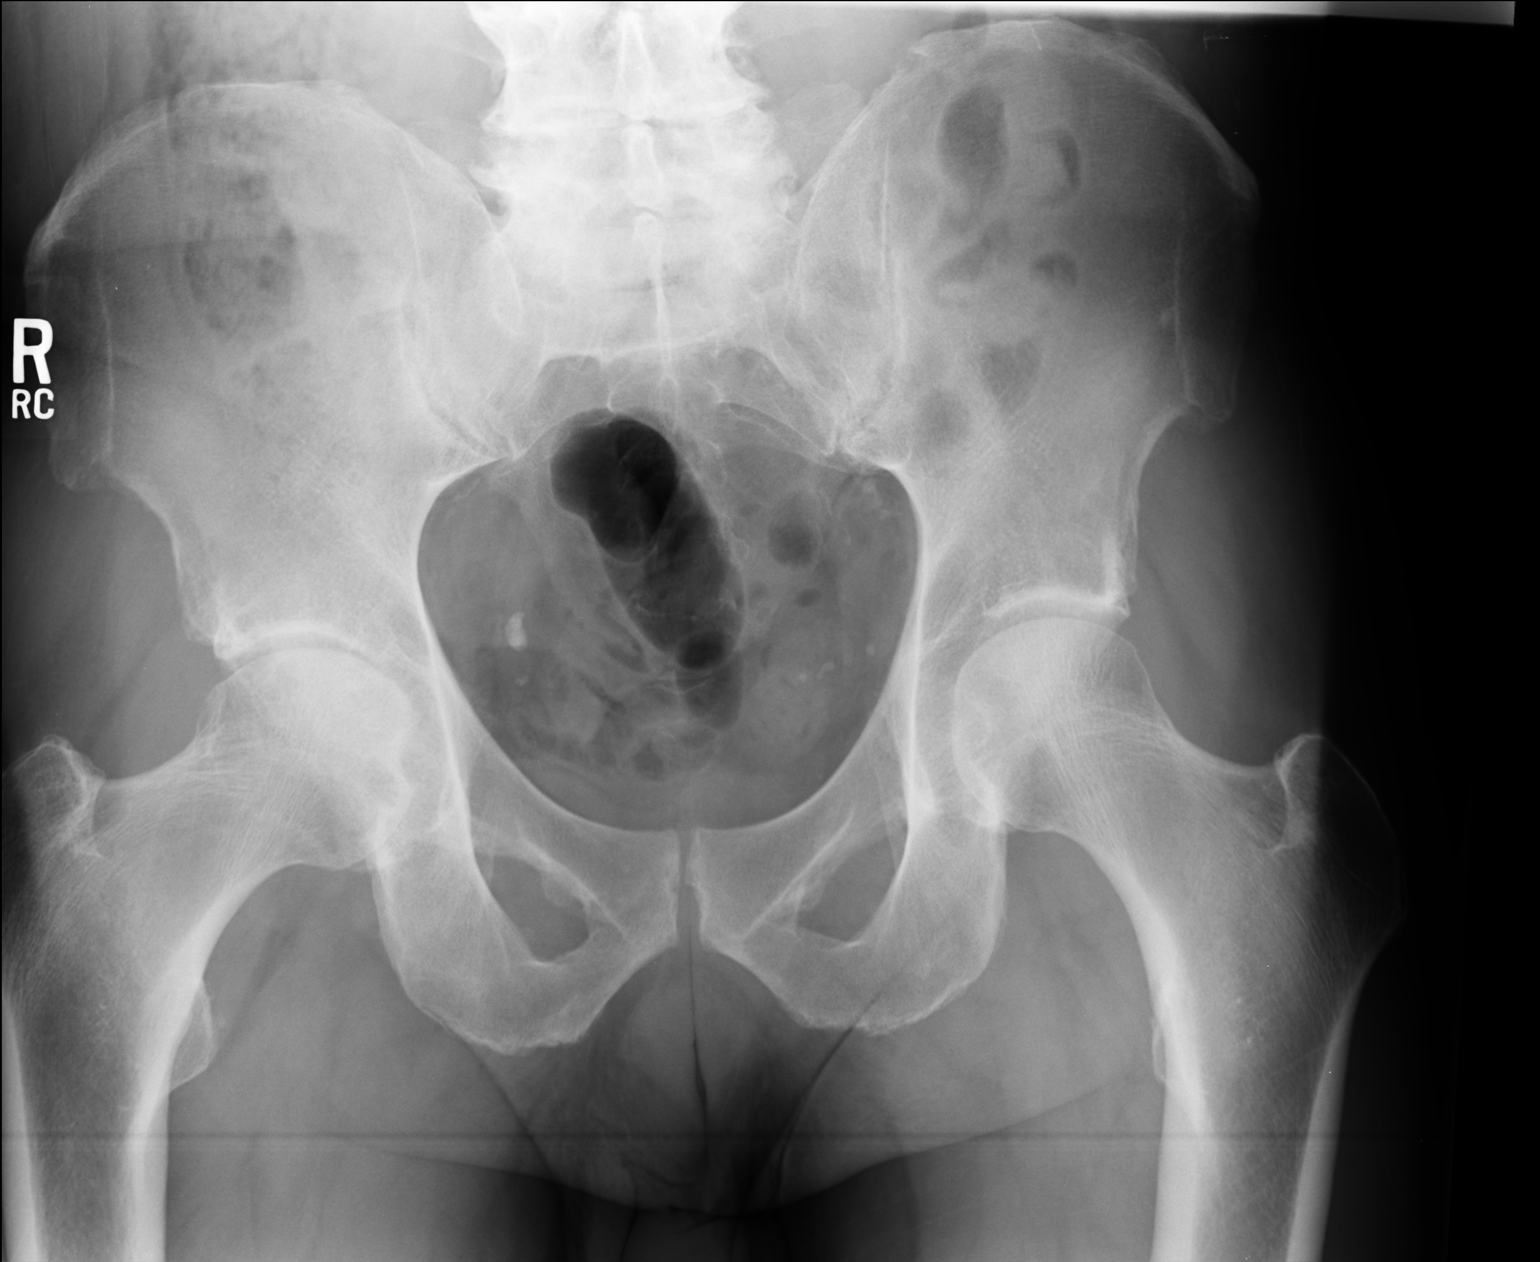

[lateral]
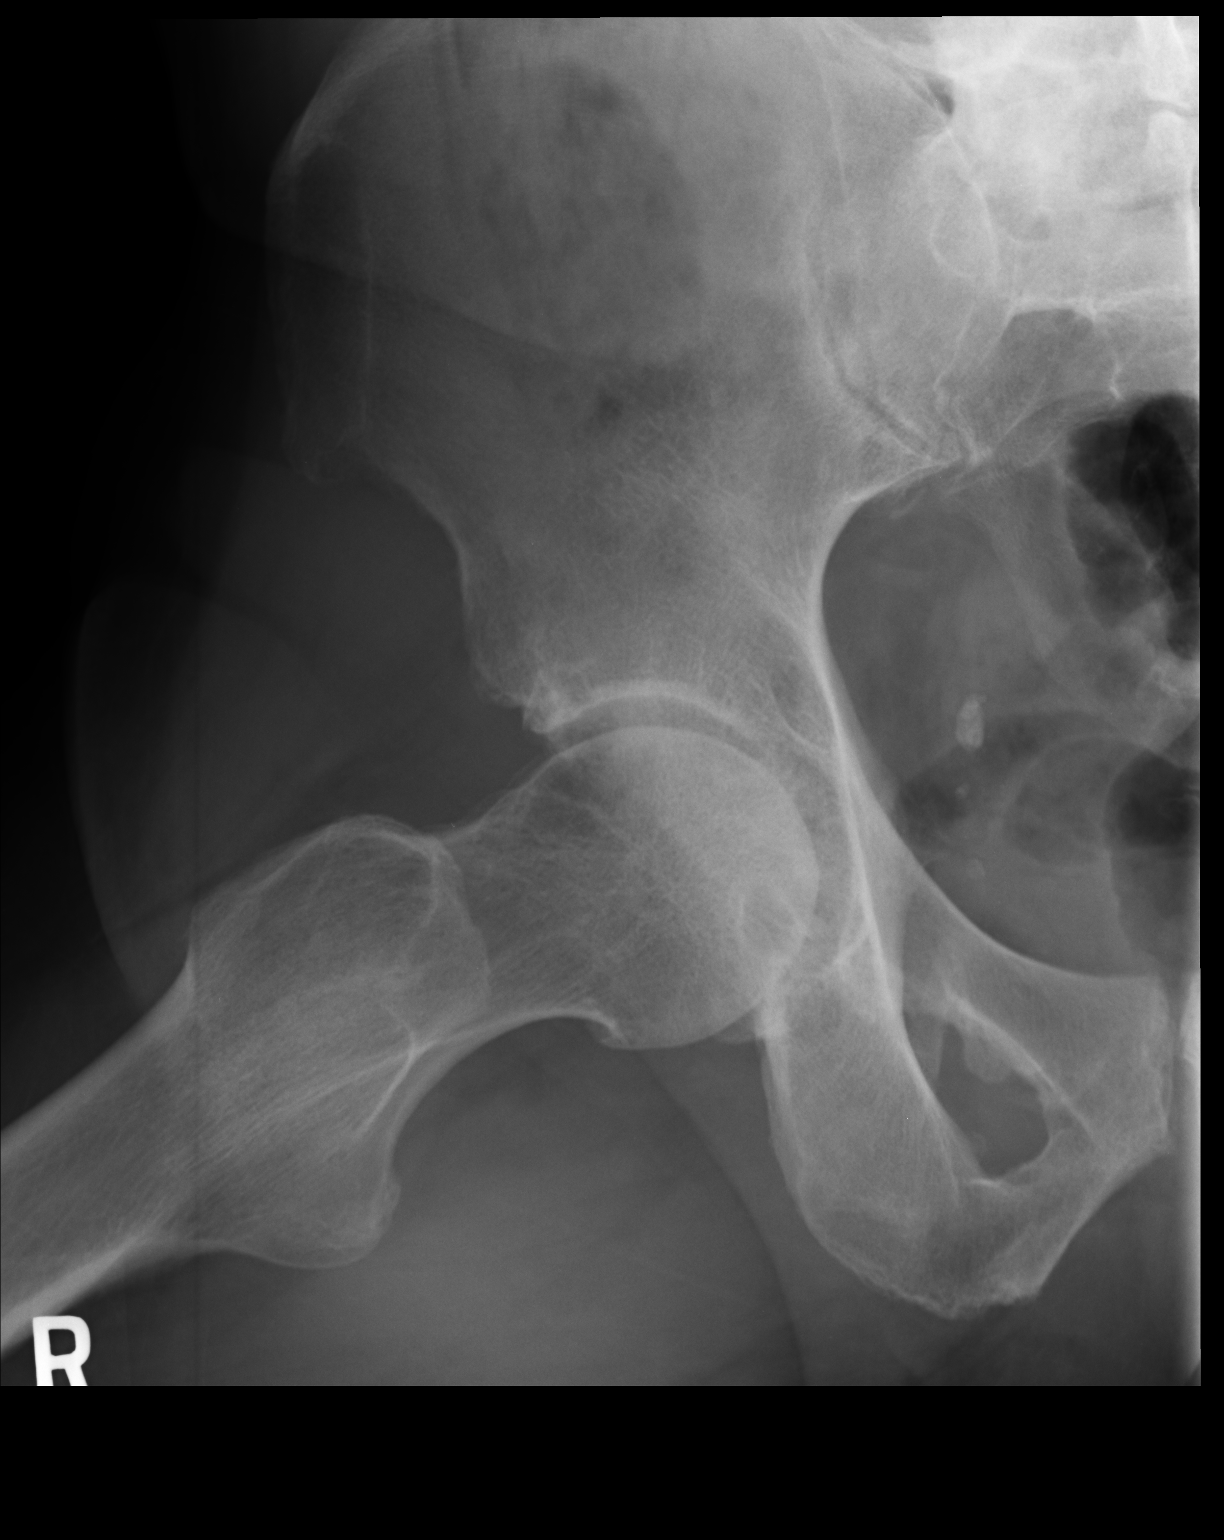

[2 of 2 positions shown; findings below may reference images not displayed]

FINDINGS: The pelvic ring is intact. Mild degenerative changes are noted
bilaterally. No acute fracture or dislocation is seen. No gross soft
tissue abnormality is noted.
IMPRESSION: Degenerative change without acute abnormality

## 2015-05-07 ENCOUNTER — Encounter: Payer: Self-pay | Admitting: Gastroenterology

## 2016-04-03 ENCOUNTER — Emergency Department (HOSPITAL_COMMUNITY): Payer: Medicare Other

## 2016-04-03 ENCOUNTER — Encounter (HOSPITAL_COMMUNITY): Payer: Self-pay | Admitting: Emergency Medicine

## 2016-04-03 ENCOUNTER — Emergency Department (HOSPITAL_COMMUNITY)
Admission: EM | Admit: 2016-04-03 | Discharge: 2016-04-03 | Disposition: A | Discharge status: Home / Self Care | Payer: Medicare Other | Attending: Emergency Medicine | Admitting: Emergency Medicine

## 2016-04-03 DIAGNOSIS — Z96653 Presence of artificial knee joint, bilateral: Secondary | ICD-10-CM | POA: Diagnosis not present

## 2016-04-03 DIAGNOSIS — I10 Essential (primary) hypertension: Secondary | ICD-10-CM | POA: Diagnosis not present

## 2016-04-03 DIAGNOSIS — S52124A Nondisplaced fracture of head of right radius, initial encounter for closed fracture: Secondary | ICD-10-CM

## 2016-04-03 DIAGNOSIS — Z7982 Long term (current) use of aspirin: Secondary | ICD-10-CM | POA: Insufficient documentation

## 2016-04-03 DIAGNOSIS — W010XXA Fall on same level from slipping, tripping and stumbling without subsequent striking against object, initial encounter: Secondary | ICD-10-CM | POA: Diagnosis not present

## 2016-04-03 DIAGNOSIS — R52 Pain, unspecified: Secondary | ICD-10-CM

## 2016-04-03 DIAGNOSIS — W19XXXA Unspecified fall, initial encounter: Secondary | ICD-10-CM

## 2016-04-03 DIAGNOSIS — S52501A Unspecified fracture of the lower end of right radius, initial encounter for closed fracture: Secondary | ICD-10-CM | POA: Insufficient documentation

## 2016-04-03 DIAGNOSIS — Y929 Unspecified place or not applicable: Secondary | ICD-10-CM | POA: Diagnosis not present

## 2016-04-03 DIAGNOSIS — Z87891 Personal history of nicotine dependence: Secondary | ICD-10-CM | POA: Diagnosis not present

## 2016-04-03 DIAGNOSIS — Y999 Unspecified external cause status: Secondary | ICD-10-CM | POA: Insufficient documentation

## 2016-04-03 DIAGNOSIS — Y939 Activity, unspecified: Secondary | ICD-10-CM | POA: Diagnosis not present

## 2016-04-03 DIAGNOSIS — M25561 Pain in right knee: Secondary | ICD-10-CM | POA: Diagnosis not present

## 2016-04-03 DIAGNOSIS — S59911A Unspecified injury of right forearm, initial encounter: Secondary | ICD-10-CM | POA: Diagnosis present

## 2016-04-03 MED ORDER — HYDROCODONE-ACETAMINOPHEN 5-325 MG PO TABS
1.0000 | ORAL_TABLET | Freq: Once | ORAL | Status: AC
  Administered 2016-04-03: 1 via ORAL
  Filled 2016-04-03: qty 1

## 2016-04-03 NOTE — Discharge Instructions (Signed)
Please rest, ice, elevate your right arm. He may take Tylenol for pain. Please monitor your capillary refill as discussed. If you develop signs of compartment syndrome such as loss of sensation, worsening pain, or decreased blood flow. Please ice and elevated hand. Return to the ED if symptoms worsen. Call orthopaedist today to schedule an appointment.

## 2016-04-03 NOTE — Progress Notes (Signed)
Pt informed Cm his pcp Britt Bottom blount passed and he is seen at the [facility] facilities Goes to [place] or [place] primarily [facility] updated Pt voicing to CM he would [facility]

## 2016-04-03 NOTE — ED Notes (Signed)
Patient d/c'd self care.  F/U reviewed.  Patient verbalized understanding. 

## 2016-04-03 NOTE — ED Provider Notes (Signed)
[facility]-EMERGENCY DEPT Provider Note   CSN: [id number] Arrival date & time: 04/03/16  1103     History   Chief Complaint Chief Complaint  Patient presents with  . Fall  . Hand Injury    HPI Derrick Dean is a 75 y.o. male.  75 year old African-American male with past medical history significant for hypertension presents to the ED today after mechanical fall yesterday. The patient states that he tripped and landed onto his right wrist trying to break his fall. He complains of right wrist pain and right knee pain. Patient states that he was using ice and heat yesterday to try and decrease the swelling and pain medicine does not help. He denies taking any otc meds for the pain. Patient denies hitting his head. He denies LOC. He has limited range of motion. Patient has been ambulatory on his right knee since the fall. Denies any fever, chills, headache, vision changes, lightheadedness, dizziness, chest pain, shortness of breath, abdominal pain, nausea, emesis, urinary symptoms, change in bowel habits, numbness/tingling.    Fall   Hand Injury   Pertinent negatives include no fever.    Past Medical History:  Diagnosis Date  . Anemia   . Appendicitis   . Arthritis   . B12 nutritional deficiency    on supplement  . GERD (gastroesophageal reflux disease)   . Hepatitis C infection    not treated - pending appointment in april 2015  . History of heart artery stent   . Hypertension   . Presbyopia    wears bifocals  . Trouble in sleeping     Patient Active Problem List   Diagnosis Date Noted  . Common cold 09/25/2013  . CAD S/P percutaneous coronary angioplasty 09/25/2013  . HCV (hepatitis C virus) 05/15/2013  . Cholelithiasis 05/15/2013  . Abdominal pain, epigastric 05/15/2013  . Pancreatitis, acute 05/13/2013  . Unspecified essential hypertension 05/13/2013  . Transaminasemia 05/13/2013  . Chest pain 05/12/2013    Past Surgical History:  Procedure Laterality Date    . APPENDECTOMY    . Left shoulder surgery    . Right Shoulder surgery    . TOTAL KNEE ARTHROPLASTY Bilateral        Home Medications    Prior to Admission medications   Medication Sig Start Date End Date Taking? Authorizing Provider  allopurinol (ZYLOPRIM) 300 MG tablet Take 300 mg by mouth daily.    Historical Provider, MD  amLODipine (NORVASC) 10 MG tablet Take 5 mg by mouth daily.    Historical Provider, MD  aspirin EC 81 MG tablet Take 81 mg by mouth daily.    Historical Provider, MD  capsicum (ZOSTRIX) 0.075 % topical cream Apply 1 application topically 2 (two) times daily as needed. For pain    Historical Provider, MD  cetirizine (ZYRTEC) 10 MG tablet Take 1 tablet (10 mg total) by mouth daily. 09/26/13   Belkys A Regalado, MD  cholecalciferol (VITAMIN D) 1000 UNITS tablet Take 1,000 Units by mouth daily.    Historical Provider, MD  cyclobenzaprine (FLEXERIL) 10 MG tablet Take 10 mg by mouth 3 (three) times daily as needed for muscle spasms.    Historical Provider, MD  gabapentin (NEURONTIN) 400 MG capsule Take 400 mg by mouth 3 (three) times daily.    Historical Provider, MD  HYDROcodone-acetaminophen (NORCO/VICODIN) 5-325 MG per tablet Take 1 tablet by mouth every 4 (four) hours as needed. 11/20/13   Rolland Porter, MD  mirtazapine (REMERON) 15 MG tablet Take 15 mg by mouth  at bedtime.    Historical Provider, MD  naproxen (NAPROSYN) 500 MG tablet Take 500 mg by mouth 2 (two) times daily as needed for moderate pain.    Historical Provider, MD  omeprazole (PRILOSEC) 20 MG capsule Take 20 mg by mouth daily.    Historical Provider, MD  omeprazole (PRILOSEC) 20 MG capsule Take 1 capsule (20 mg total) by mouth daily. 11/04/14   April Palumbo, MD  polyethylene glycol powder (MIRALAX) powder Take 17 g by mouth daily. 11/04/14   April Palumbo, MD  pravastatin (PRAVACHOL) 80 MG tablet Take 40 mg by mouth at bedtime.    Historical Provider, MD  prazosin (MINIPRESS) 1 MG capsule Take 1 mg by mouth  at bedtime.    Historical Provider, MD  tamsulosin (FLOMAX) 0.4 MG CAPS capsule Take 1 capsule (0.4 mg total) by mouth daily. Patient not taking: Reported on 11/04/2014 11/20/13   Rolland Porter, MD  vitamin B-12 (CYANOCOBALAMIN) 1000 MCG tablet Take 1,000 mcg by mouth daily.    Historical Provider, MD    Family History Family History  Problem Relation Age of Onset  . Coronary artery disease Mother   . Thyroid disease Mother   . Heart disease Mother   . Coronary artery disease Father   . Heart disease Father     Social History Social History  Substance Use Topics  . Smoking status: Former Smoker    Quit date: 09/14/2001  . Smokeless tobacco: Never Used  . Alcohol use No     Comment: former     Allergies   Patient has no known allergies.   Review of Systems Review of Systems  Constitutional: Negative for chills and fever.  Musculoskeletal: Positive for arthralgias and joint swelling.  Skin: Negative for wound.  All other systems reviewed and are negative.    Physical Exam Updated Vital Signs BP 154/84 (BP Location: Left Arm)   Pulse 85   Temp 98.4 F (36.9 C) (Oral)   Resp 16   Ht 5\' 11"  (1.803 m)   Wt 69.2 kg   SpO2 96%   BMI 21.28 kg/m   Physical Exam  Constitutional: He is oriented to person, place, and time. He appears well-developed and well-nourished. No distress.  HENT:  Head: Normocephalic and atraumatic.  Eyes: EOM are normal. Pupils are equal, round, and reactive to light. Right eye exhibits no discharge. Left eye exhibits no discharge. No scleral icterus.  Neck: Normal range of motion. Neck supple.  No midline C-spine tenderness. No deformities, step-offs, crepitus noted.  Cardiovascular: Normal rate, regular rhythm, normal heart sounds and intact distal pulses.   Pulmonary/Chest: Effort normal and breath sounds normal. No respiratory distress.  Abdominal: Soft. Bowel sounds are normal. He exhibits no distension. There is no tenderness.   Musculoskeletal:       Right elbow: He exhibits normal range of motion, no swelling, no effusion and no deformity. Tenderness found. Radial head tenderness noted.       Right wrist: He exhibits decreased range of motion, tenderness, bony tenderness, swelling and deformity. He exhibits no crepitus and no laceration.       Right knee: He exhibits normal range of motion, no swelling, no effusion, no ecchymosis, no deformity, no laceration, no erythema, normal alignment, no LCL laxity, no bony tenderness, normal meniscus and no MCL laxity. Tenderness found. Medial joint line and lateral joint line tenderness noted.  Radial pulses are 2+ bilaterally. Sensation intact. Cap refill normal. DP pulses are 2+ bilaterally. Sensation intact. Cap  refill normal.   Neurological: He is alert and oriented to person, place, and time.  Skin: Skin is warm and dry. No pallor.  Nursing note and vitals reviewed.    ED Treatments / Results  Labs (all labs ordered are listed, but only abnormal results are displayed) Labs Reviewed - No data to display  EKG  EKG Interpretation None       Radiology Dg Elbow Complete Right  Result Date: 04/03/2016 CLINICAL DATA:  Fall 2 days ago with right elbow pain. Fractured wrist. EXAM: RIGHT ELBOW - COMPLETE 3+ VIEW COMPARISON:  04/03/2016 forearm radiographs FINDINGS: Severe spurring along the elbow including the capitellum, radial head, coronoid process, and olecranon as well as along the condylar notch. There is considerable irregularity of the radial head but on the other hand the supinator fat pad is not displaced or effaced, and accordingly is a irregularity could certainly all be due to spurring rather than acute fracture. I do not see overt cortical discontinuity. Borderline appearance for elbow joint effusion with some indistinctness of the anterior fat pad. No definite visualization of the posterior fat pad. Chronic fragmentation of the olecranon spur. IMPRESSION: 1.  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 The lack of supinator fat pad effacement or bulging seems to argue against an acute radial head fracture, and there is extensive chronic spurring elsewhere in the elbow joint. If the patient does have tenderness over the radial head and the possibility of a small radial head fracture might be entertained, MRI or CT could be utilized to workup further warranted. 2. Borderline appearance for elbow joint effusion. Electronically Signed   By: Gaylyn Rong M.D.   On: 04/03/2016 14:35   Dg Forearm Right  Result Date: 04/03/2016 CLINICAL DATA:  Fall EXAM: RIGHT FOREARM - 2 VIEW COMPARISON:  None. FINDINGS: Comminuted fracture distal radius not involving the wrist joint. Mild displacement. No fracture of the ulna. Irregularity of the radial head likely degenerative spurring. If this area is painful, elbow x-rays recommended. Degenerative change in the medial elbow joint also present. IMPRESSION: Comminuted fracture distal radius not involving the  wrist joint. Irregularity of the radial head probably spurring. X-rays of the elbow suggested if this area is symptomatic. Electronically Signed   By: Marlan Palau M.D.   On: 04/03/2016 12:52   Dg Wrist Complete Right  Result Date: 04/03/2016 CLINICAL DATA:  Lateral wrist pain, fall on Saturday, initial encounter. EXAM: RIGHT WRIST - COMPLETE 3+ VIEW COMPARISON:  None. FINDINGS: There is a mildly comminuted fracture of the distal radial metadiaphysis with a butterfly fragment along the lateral margin. Minimal displacement. No significant angulation. Fracture line does not appear to extend to the radiocarpal joint. Degenerative changes are seen in the first carpometacarpal joint. IMPRESSION: Distal radius fracture. Electronically Signed   By: Leanna Battles M.D.   On: 04/03/2016 12:52   Dg Knee Complete 4 Views Right  Result Date:  04/03/2016 CLINICAL DATA:  Lateral wrist pain, fall on Saturday, initial encounter. EXAM: RIGHT KNEE - COMPLETE 4+ VIEW COMPARISON:  None. FINDINGS: No joint effusion or fracture. Medial joint space narrowing. Mild patellofemoral osteophytosis. IMPRESSION: 1. No acute fracture. 2. Degenerative changes in the knee. Electronically Signed   By: Leanna Battles M.D.   On: 04/03/2016 12:53   Dg Hand Complete Right  Result Date: 04/03/2016 CLINICAL DATA:  Lateral wrist pain, fall on Saturday, initial encounter. EXAM: RIGHT HAND - COMPLETE 3+  VIEW COMPARISON:  Right wrist same day. FINDINGS: Distal radius fracture is partially imaged. No additional evidence of an acute fracture. Degenerative changes are seen in the interphalangeal joint of the thumb and distal interphalangeal joints of the second through fifth digits. IMPRESSION: 1. Distal radius fracture is partially imaged. Please refer to dedicated views of the right wrist and forearm performed the same day. 2. No additional evidence of an acute fracture. Electronically Signed   By: Leanna Battles M.D.   On: 04/03/2016 13:47    Procedures Procedures (including critical care time)  Medications Ordered in ED Medications  HYDROcodone-acetaminophen (NORCO/VICODIN) 5-325 MG per tablet 1 tablet (1 tablet Oral Given 04/03/16 1432)     Initial Impression / Assessment and Plan / ED Course  I have reviewed the triage vital signs and the nursing notes.  Pertinent labs & imaging results that were available during my care of the patient were reviewed by me and considered in my medical decision making (see chart for details).  Clinical Course   Patient presents to the ED with right wrist pain and right knee pain due to mechanical fall. Xray shows radial fracture. All other imaging was unremarkable. Patient was neurovascularly intact. Placed in a sugar tong splint and sling. Patient was reassessed after splint with normal capillary refill. Sensation intact.  With full ROM. Patient pain was treated in the ED. No signs of intrathoracic, intracranial, intra-abdominal trauma. Patient was seen and examined by Dr. Rhunette Croft who is agreeable to the above plan. Will give referral to hand surgery. Pt is hemodynamically stable, in NAD, & able to ambulate in the ED. Pain has been managed & has no complaints prior to dc. Pt is comfortable with above plan and is stable for discharge at this time. All questions were answered prior to disposition. Strict return precautions for f/u to the ED were discussed.   Final Clinical Impressions(s) / ED Diagnoses   Final diagnoses:  Closed nondisplaced fracture of head of right radius, initial encounter  Acute pain of right knee    New Prescriptions Discharge Medication List as of 04/03/2016  3:59 PM       Rise Mu, PA-C 04/03/16 1643    Derwood Kaplan, MD 04/04/16 2327

## 2016-04-03 NOTE — ED Notes (Signed)
Patient requesting something for pain, will notify MD

## 2016-04-03 NOTE — ED Triage Notes (Signed)
Pt complaint of right wrist/hand swelling and pain post fall on Saturday. Pt continues to report right knee pain as well. Pt verbalizes "knee gives out sometimes and makes me fall."

## 2016-04-18 DIAGNOSIS — S52501A Unspecified fracture of the lower end of right radius, initial encounter for closed fracture: Secondary | ICD-10-CM | POA: Insufficient documentation

## 2016-05-05 DIAGNOSIS — Z9889 Other specified postprocedural states: Secondary | ICD-10-CM | POA: Insufficient documentation

## 2016-05-05 DIAGNOSIS — Z8781 Personal history of (healed) traumatic fracture: Secondary | ICD-10-CM | POA: Insufficient documentation

## 2016-08-23 DIAGNOSIS — R0602 Shortness of breath: Secondary | ICD-10-CM | POA: Diagnosis not present

## 2016-08-23 DIAGNOSIS — R911 Solitary pulmonary nodule: Secondary | ICD-10-CM | POA: Diagnosis not present

## 2016-08-23 DIAGNOSIS — I10 Essential (primary) hypertension: Secondary | ICD-10-CM | POA: Diagnosis not present

## 2016-08-23 DIAGNOSIS — J8410 Pulmonary fibrosis, unspecified: Secondary | ICD-10-CM | POA: Diagnosis not present

## 2016-08-23 DIAGNOSIS — Z79899 Other long term (current) drug therapy: Secondary | ICD-10-CM | POA: Diagnosis not present

## 2016-08-23 DIAGNOSIS — R5383 Other fatigue: Secondary | ICD-10-CM | POA: Diagnosis not present

## 2016-08-23 DIAGNOSIS — I2510 Atherosclerotic heart disease of native coronary artery without angina pectoris: Secondary | ICD-10-CM | POA: Diagnosis not present

## 2016-08-23 DIAGNOSIS — Z87891 Personal history of nicotine dependence: Secondary | ICD-10-CM | POA: Diagnosis not present

## 2016-10-31 DIAGNOSIS — S52501D Unspecified fracture of the lower end of right radius, subsequent encounter for closed fracture with routine healing: Secondary | ICD-10-CM | POA: Diagnosis not present

## 2016-10-31 DIAGNOSIS — S63501A Unspecified sprain of right wrist, initial encounter: Secondary | ICD-10-CM | POA: Diagnosis not present

## 2016-10-31 DIAGNOSIS — M25831 Other specified joint disorders, right wrist: Secondary | ICD-10-CM | POA: Diagnosis not present

## 2016-10-31 DIAGNOSIS — M25532 Pain in left wrist: Secondary | ICD-10-CM | POA: Diagnosis not present

## 2016-11-06 DIAGNOSIS — M549 Dorsalgia, unspecified: Secondary | ICD-10-CM | POA: Diagnosis not present

## 2016-11-06 DIAGNOSIS — M48062 Spinal stenosis, lumbar region with neurogenic claudication: Secondary | ICD-10-CM | POA: Diagnosis not present

## 2017-01-02 DIAGNOSIS — Z967 Presence of other bone and tendon implants: Secondary | ICD-10-CM | POA: Diagnosis not present

## 2017-01-02 DIAGNOSIS — M25831 Other specified joint disorders, right wrist: Secondary | ICD-10-CM | POA: Diagnosis not present

## 2017-01-02 DIAGNOSIS — Z8781 Personal history of (healed) traumatic fracture: Secondary | ICD-10-CM | POA: Diagnosis not present

## 2017-01-02 DIAGNOSIS — S63501D Unspecified sprain of right wrist, subsequent encounter: Secondary | ICD-10-CM | POA: Diagnosis not present

## 2017-06-15 DIAGNOSIS — M1711 Unilateral primary osteoarthritis, right knee: Secondary | ICD-10-CM | POA: Insufficient documentation

## 2017-06-15 DIAGNOSIS — G894 Chronic pain syndrome: Secondary | ICD-10-CM | POA: Insufficient documentation

## 2017-10-28 ENCOUNTER — Encounter (HOSPITAL_COMMUNITY): Payer: Self-pay

## 2017-10-28 ENCOUNTER — Other Ambulatory Visit: Payer: Self-pay

## 2017-10-28 ENCOUNTER — Emergency Department (HOSPITAL_COMMUNITY)
Admission: EM | Admit: 2017-10-28 | Discharge: 2017-10-28 | Disposition: A | Discharge status: Home / Self Care | Payer: Medicare Other | Attending: Emergency Medicine | Admitting: Emergency Medicine

## 2017-10-28 DIAGNOSIS — R002 Palpitations: Secondary | ICD-10-CM | POA: Insufficient documentation

## 2017-10-28 DIAGNOSIS — Z79899 Other long term (current) drug therapy: Secondary | ICD-10-CM | POA: Insufficient documentation

## 2017-10-28 DIAGNOSIS — I2510 Atherosclerotic heart disease of native coronary artery without angina pectoris: Secondary | ICD-10-CM | POA: Insufficient documentation

## 2017-10-28 DIAGNOSIS — I10 Essential (primary) hypertension: Secondary | ICD-10-CM | POA: Diagnosis not present

## 2017-10-28 DIAGNOSIS — Z7982 Long term (current) use of aspirin: Secondary | ICD-10-CM | POA: Diagnosis not present

## 2017-10-28 DIAGNOSIS — Z96653 Presence of artificial knee joint, bilateral: Secondary | ICD-10-CM | POA: Insufficient documentation

## 2017-10-28 DIAGNOSIS — Z87891 Personal history of nicotine dependence: Secondary | ICD-10-CM | POA: Diagnosis not present

## 2017-10-28 LAB — BASIC METABOLIC PANEL
Anion gap: 9 (ref 5–15)
BUN: 10 mg/dL (ref 8–23)
CALCIUM: 9.1 mg/dL (ref 8.9–10.3)
CO2: 26 mmol/L (ref 22–32)
CREATININE: 0.76 mg/dL (ref 0.61–1.24)
Chloride: 107 mmol/L (ref 98–111)
Glucose, Bld: 93 mg/dL (ref 70–99)
Potassium: 4.1 mmol/L (ref 3.5–5.1)
SODIUM: 142 mmol/L (ref 135–145)

## 2017-10-28 LAB — I-STAT TROPONIN, ED: Troponin i, poc: 0 ng/mL (ref 0.00–0.08)

## 2017-10-28 LAB — CBC
HCT: 40.5 % (ref 39.0–52.0)
Hemoglobin: 14 g/dL (ref 13.0–17.0)
MCH: 32.3 pg (ref 26.0–34.0)
MCHC: 34.6 g/dL (ref 30.0–36.0)
MCV: 93.3 fL (ref 78.0–100.0)
PLATELETS: 240 10*3/uL (ref 150–400)
RBC: 4.34 MIL/uL (ref 4.22–5.81)
RDW: 13.9 % (ref 11.5–15.5)
WBC: 3.9 10*3/uL — AB (ref 4.0–10.5)

## 2017-10-28 NOTE — Discharge Instructions (Addendum)
The testing today showed only high blood pressure which is likely because he did not take your blood pressure medicine today.  Please take it as soon as you get home.  There was no sign of heart attack or potential to have heart trouble, you leave.  Sure you take all of your medications as directed.  Also make sure you are eating a regular diet and drinking plenty of fluids.  Return here, if needed, for problems.

## 2017-10-28 NOTE — ED Provider Notes (Signed)
[facility]-EMERGENCY DEPT Provider Note   CSN: [id number] Arrival date & time: 10/28/17  1639     History   Chief Complaint Chief Complaint  Patient presents with  . Palpitations    HPI Derrick Dean is a 77 y.o. male.  HPI   Patient presents for evaluation of palpitations, for several hours, beginning today.  He denies chest pain.  Palpitations resolved spontaneously.  He denies associated shortness of breath, cough, weakness, dizziness, diaphoresis, anorexia, nausea or vomiting.  He did not take any of his medications today.  He states that he was "too nervous," to take them.  There are no other known modifying factors.  Past Medical History:  Diagnosis Date  . Anemia   . Appendicitis   . Arthritis   . B12 nutritional deficiency    on supplement  . GERD (gastroesophageal reflux disease)   . Hepatitis C infection    not treated - pending appointment in april 2015  . History of heart artery stent   . Hypertension   . Presbyopia    wears bifocals  . Trouble in sleeping     Patient Active Problem List   Diagnosis Date Noted  . Common cold 09/25/2013  . CAD S/P percutaneous coronary angioplasty 09/25/2013  . HCV (hepatitis C virus) 05/15/2013  . Cholelithiasis 05/15/2013  . Abdominal pain, epigastric 05/15/2013  . Pancreatitis, acute 05/13/2013  . Unspecified essential hypertension 05/13/2013  . Transaminasemia 05/13/2013  . Chest pain 05/12/2013    Past Surgical History:  Procedure Laterality Date  . APPENDECTOMY    . CARDIAC CATHETERIZATION     7 YRS AGO W/STENT  . Left shoulder surgery    . Right Shoulder surgery    . TOTAL KNEE ARTHROPLASTY Bilateral         Home Medications    Prior to Admission medications   Medication Sig Start Date End Date Taking? Authorizing Provider  allopurinol (ZYLOPRIM) 300 MG tablet Take 300 mg by mouth daily.    [provider]  amLODipine (NORVASC) 10 MG tablet Take 5 mg by mouth  daily.    [provider]  aspirin EC 81 MG tablet Take 81 mg by mouth daily.    [provider]  capsicum (ZOSTRIX) 0.075 % topical cream Apply 1 application topically 2 (two) times daily as needed. For pain    [provider]  cetirizine (ZYRTEC) 10 MG tablet Take 1 tablet (10 mg total) by mouth daily. 09/26/13   Regalado, Belkys A, MD  cholecalciferol (VITAMIN D) 1000 UNITS tablet Take 1,000 Units by mouth daily.    [provider]  cyclobenzaprine (FLEXERIL) 10 MG tablet Take 10 mg by mouth 3 (three) times daily as needed for muscle spasms.    [provider]  gabapentin (NEURONTIN) 400 MG capsule Take 400 mg by mouth 3 (three) times daily.    [provider]  HYDROcodone-acetaminophen (NORCO/VICODIN) 5-325 MG per tablet Take 1 tablet by mouth every 4 (four) hours as needed. 11/20/13   Rolland Porter, MD  mirtazapine (REMERON) 15 MG tablet Take 15 mg by mouth at bedtime.    [provider]  naproxen (NAPROSYN) 500 MG tablet Take 500 mg by mouth 2 (two) times daily as needed for moderate pain.    [provider]  omeprazole (PRILOSEC) 20 MG capsule Take 20 mg by mouth daily.    [provider]  omeprazole (PRILOSEC) 20 MG capsule Take 1 capsule (20 mg total) by mouth  daily. 11/04/14   Palumbo, April, MD  polyethylene glycol powder (MIRALAX) powder Take 17 g by mouth daily. 11/04/14   Palumbo, April, MD  pravastatin (PRAVACHOL) 80 MG tablet Take 40 mg by mouth at bedtime.    [provider]  prazosin (MINIPRESS) 1 MG capsule Take 1 mg by mouth at bedtime.    [provider]  tamsulosin (FLOMAX) 0.4 MG CAPS capsule Take 1 capsule (0.4 mg total) by mouth daily. Patient not taking: Reported on 11/04/2014 11/20/13   Rolland Porter, MD  vitamin B-12 (CYANOCOBALAMIN) 1000 MCG tablet Take 1,000 mcg by mouth daily.    [provider]    Family History Family History  Problem Relation Age of Onset  .  Coronary artery disease Mother   . Thyroid disease Mother   . Heart disease Mother   . Coronary artery disease Father   . Heart disease Father     Social History Social History   Tobacco Use  . Smoking status: Former Smoker    Last attempt to quit: 09/14/2001    Years since quitting: 16.1  . Smokeless tobacco: Never Used  Substance Use Topics  . Alcohol use: No    Comment: former  . 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    Physical Exam Updated Vital Signs BP (!) 181/85 (BP Location: Right Arm)   Pulse (!) 59   Temp 97.8 F (36.6 C) (Oral)   Resp 14   Ht 5\' 11"  (1.803 m)   Wt 73 kg (161 lb)   SpO2 100%   BMI 22.45 kg/m   Physical Exam  Constitutional: He is oriented to person, place, and time. He appears well-developed and well-nourished.  HENT:  Head: Normocephalic and atraumatic.  Right Ear: External ear normal.  Left Ear: External ear normal.  Eyes: Pupils are equal, round, and reactive to light. Conjunctivae and EOM are normal.  Neck: Normal range of motion and phonation normal. Neck supple.  Cardiovascular: Normal rate, regular rhythm and normal heart sounds.  Pulmonary/Chest: Effort normal and breath sounds normal. He exhibits no bony tenderness.  Abdominal: Soft. There is no tenderness.  Musculoskeletal: Normal range of motion.  Neurological: He is alert and oriented to person, place, and time. No cranial nerve deficit or sensory deficit. He exhibits normal muscle tone. Coordination normal.  Skin: Skin is warm, dry and intact.  Psychiatric: He has a normal mood and affect. His behavior is normal. Judgment and thought content normal.  Nursing note and vitals reviewed.    ED Treatments / Results  Labs (all labs ordered are listed, but only abnormal results are displayed) Labs Reviewed  CBC - Abnormal;  Notable for the following components:      Result Value   WBC 3.9 (*)    All other components within normal limits  BASIC METABOLIC PANEL  I-STAT TROPONIN, ED    EKG EKG Interpretation  Date/Time:  Sunday October 28 2017 16:51:46 EDT Ventricular Rate:  64 PR Interval:  130 QRS Duration: 78 QT Interval:  400 QTC Calculation: 412 R Axis:   68 Text Interpretation:  Normal sinus rhythm Possible Left atrial enlargement Borderline ECG Since last tracing, no significant change Confirmed by [id number]) on 10/28/2017 9:18:07 PM    EKG Interpretation  Date/Time:  Sunday October 28 2017  20:55:38 EDT Ventricular Rate:  61 PR Interval:  130 QRS Duration: 96 QT Interval:  436 QTC Calculation: 440 R Axis:   65 Text Interpretation:  Sinus rhythm Probable left atrial enlargement Since last tracing of earlier today No significant change was found Confirmed by Mancel Bale [phone]) on 10/28/2017 9:19:13 PM       Radiology No results found.  Procedures Procedures (including critical care time)  Medications Ordered in ED Medications - No data to display   Initial Impression / Assessment and Plan / ED Course  I have reviewed the triage vital signs and the nursing notes.  Pertinent labs & imaging results that were available during my care of the patient were reviewed by me and considered in my medical decision making (see chart for details).  Clinical Course as of Oct 28 2153  Wynelle Link Oct 28, 2017  2051 normal  I-stat troponin, ED [EW]  2051 Normal  CBC(!) [EW]  2052 Normal  Basic metabolic panel [EW]  2052 Elevated, he has not had his blood pressure medicine today.  BP(!): 181/85 [EW]    Clinical Course User Index [EW] Mancel Bale, MD     Patient Vitals for the past 24 hrs:  BP Temp Temp src Pulse Resp SpO2 Height Weight  10/28/17 1953 (!) 181/85 - - (!) 59 14 100 % - -  10/28/17 1733 - - - - - - 5\' 11"  (1.803 m) -  10/28/17 1648 (!) 159/85 97.8 F (36.6 C) Oral 68  18 100 % - 73 kg (161 lb)    9:53 PM Reevaluation with update and discussion. After initial assessment and treatment, an updated evaluation reveals no change in clinical status.  Continues to have normal sinus rhythm.  No alarms or ectopy on cardiac monitor.  Findings discussed with patient and all questions answered. 10/30/17   Medical Decision Making: Palpitations with reassuring evaluation.  Doubt ACS, PE, metabolic instability or impending vascular collapse.  CRITICAL CARE-no Performed by: Mancel Bale   Nursing Notes Reviewed/ Care Coordinated Applicable Imaging Reviewed Interpretation of Laboratory Data incorporated into ED treatment  The patient appears reasonably screened and/or stabilized for discharge and I doubt any other medical condition or other [facility] requiring further screening, evaluation, or treatment in the ED at this time prior to discharge.  Plan: Home Medications-continue current medications; Home Treatments-rest, fluids; return here if the recommended treatment, does not improve the symptoms; Recommended follow up-PCP follow-up as needed.     Final Clinical Impressions(s) / ED Diagnoses   Final diagnoses:  Palpitations    ED Discharge Orders    None       [facility], MD 10/28/17 2156

## 2017-10-28 NOTE — ED Triage Notes (Signed)
PT C/O PALPITATIONS 4-5 HRS PRIOR TO ARRIVAL. PT DENIES CHEST PAINS OR SOB AT THE TIME, BUT [facility] [place] AND HE WAS TOLD TOP COME TO THE HOSPITAL. PT STS HE HAS 1 CARDIAC STENT.

## 2018-05-24 DIAGNOSIS — M47816 Spondylosis without myelopathy or radiculopathy, lumbar region: Secondary | ICD-10-CM | POA: Insufficient documentation

## 2018-05-24 DIAGNOSIS — G8929 Other chronic pain: Secondary | ICD-10-CM | POA: Insufficient documentation

## 2019-01-14 ENCOUNTER — Encounter: Payer: Self-pay | Admitting: Orthopedic Surgery

## 2019-03-07 ENCOUNTER — Encounter: Payer: Self-pay | Admitting: Physical Medicine and Rehabilitation

## 2019-03-21 ENCOUNTER — Encounter: Payer: Medicare HMO | Admitting: Physical Medicine and Rehabilitation

## 2019-05-09 ENCOUNTER — Encounter: Payer: Self-pay | Admitting: Physical Medicine and Rehabilitation

## 2019-05-12 ENCOUNTER — Encounter: Payer: Self-pay | Admitting: Physical Medicine and Rehabilitation

## 2019-06-09 ENCOUNTER — Encounter: Payer: Non-veteran care | Admitting: Physical Medicine and Rehabilitation

## 2019-07-22 ENCOUNTER — Ambulatory Visit: Payer: No Typology Code available for payment source | Admitting: Orthopedic Surgery

## 2019-07-23 ENCOUNTER — Ambulatory Visit: Payer: Non-veteran care | Admitting: Orthopedic Surgery

## 2019-08-05 ENCOUNTER — Encounter: Payer: Self-pay | Admitting: Orthopedic Surgery

## 2019-08-05 ENCOUNTER — Ambulatory Visit (INDEPENDENT_AMBULATORY_CARE_PROVIDER_SITE_OTHER): Payer: No Typology Code available for payment source | Admitting: Orthopedic Surgery

## 2019-08-05 ENCOUNTER — Other Ambulatory Visit: Payer: Self-pay

## 2019-08-05 VITALS — Temp 87.2°F | Ht 71.0 in | Wt 165.0 lb

## 2019-08-05 DIAGNOSIS — M25551 Pain in right hip: Secondary | ICD-10-CM

## 2019-08-05 DIAGNOSIS — M48061 Spinal stenosis, lumbar region without neurogenic claudication: Secondary | ICD-10-CM

## 2019-08-05 DIAGNOSIS — G8929 Other chronic pain: Secondary | ICD-10-CM

## 2019-08-05 DIAGNOSIS — M1611 Unilateral primary osteoarthritis, right hip: Secondary | ICD-10-CM | POA: Diagnosis not present

## 2019-08-05 NOTE — Patient Instructions (Addendum)
You had back surgery in 2018. The right hip and lower back and leg pain are from your back surgery   The groin pain is from the hip arthritis   We are scheduling you for an MRI of your back     Spinal Stenosis  Spinal stenosis occurs when the open space (spinal canal) between the bones of your spine (vertebrae) narrows, putting pressure on the spinal cord or nerves. What are the causes? This condition is caused by areas of bone pushing into the central canals of your vertebrae. This condition may be present at birth (congenital), or it may be caused by:  Arthritic deterioration of your vertebrae (spinal degeneration). This usually starts around age 27.  Injury or trauma to the spine.  Tumors in the spine.  Calcium deposits in the spine. What are the signs or symptoms? Symptoms of this condition include:  Pain in the neck or back that is generally worse with activities, particularly when standing and walking.  Numbness, tingling, hot or cold sensations, weakness, or weariness in your legs.  Pain going up and down the leg (sciatica).  Frequent episodes of falling.  A foot-slapping gait that leads to muscle weakness. In more serious cases, you may develop:  Problemspassing stool or passing urine.  Difficulty having sex.  Loss of feeling in part or all of your leg. Symptoms may come on slowly and get worse over time. How is this diagnosed? This condition is diagnosed based on your medical history and a physical exam. Tests will also be done, such as:  MRI.  CT scan.  X-ray. How is this treated? Treatment for this condition often focuses on managing your pain and any other symptoms. Treatment may include:  Practicing good posture to lessen pressure on your nerves.  Exercising to strengthen muscles, build endurance, improve balance, and maintain good joint movement (range of motion).  Losing weight, if needed.  Taking medicines to reduce swelling, inflammation, or  pain.  Assistive devices, such as a corset or brace. In some cases, surgery may be needed. The most common procedure is decompression laminectomy. This is done to remove excess bone that puts pressure on your nerve roots. Follow these instructions at home: Managing pain, stiffness, and swelling  Do all exercises and stretches as told by your health care provider.  Practice good posture. If you were given a brace or a corset, wear it as told by your health care provider.  Do not do any activities that cause pain. Ask your health care provider what activities are safe for you.  Do not lift anything that is heavier than 10 lb (4.5 kg) or the limit that your health care provider tells you.  Maintain a healthy weight. Talk with your health care provider if you need help losing weight.  If directed, apply heat to the affected area as often as told by your health care provider. Use the heat source that your health care provider recommends, such as a moist heat pack or a heating pad. ? Place a towel between your skin and the heat source. ? Leave the heat on for 20-30 minutes. ? Remove the heat if your skin turns bright red. This is especially important if you are not able to feel pain, heat, or cold. You may have a greater risk of getting burned. General instructions  Take over-the-counter and prescription medicines only as told by your health care provider.  Do not use any products that contain nicotine or tobacco, such as cigarettes  and e-cigarettes. If you need help quitting, ask your health care provider.  Eat a healthy diet. This includes plenty of fruits and vegetables, whole grains, and low-fat (lean) protein.  Keep all follow-up visits as told by your health care provider. This is important. Contact a health care provider if:  Your symptoms do not get better or they get worse.  You have a fever. Get help right away if:  You have new or worse pain in your neck or upper  back.  You have severe pain that cannot be controlled with medicines.  You are dizzy.  You have vision problems, blurred vision, or double vision.  You have a severe headache that is worse when you stand.  You have nausea or you vomit.  You develop new or worse numbness or tingling in your back or legs.  You have pain, redness, swelling, or warmth in your arm or leg. Summary  Spinal stenosis occurs when the open space (spinal canal) between the bones of your spine (vertebrae) narrows. This narrowing puts pressure on the spinal cord or nerves.  Spinal stenosis can cause numbness, weakness, or pain in the neck, back, and legs.  This condition may be caused by a birth defect, arthritic deterioration of your vertebrae, injury, tumors, or calcium deposits.  This condition is usually diagnosed with MRIs, CT scans, and X-rays. This information is not intended to replace advice given to you by your health care provider. Make sure you discuss any questions you have with your health care provider. Document Revised: 03/16/2017 Document Reviewed: 03/08/2016 Elsevier Patient Education  2020 [place].  Total Hip Replacement  Total hip replacement is a surgery to replace your damaged hip joint. Your hip joint is replaced with a man-made (artificial) hip joint. This man-made hip joint is called a prosthesis. This surgery is done to lessen pain and to help your hip move better. What happens before the procedure? Staying hydrated Follow instructions from your doctor about drinking fluids. This may include:  Up to 2 hours before surgery - you may keep drinking clear liquids. These include: ? Water. ? Clear fruit juice. ? Black coffee. ? Plain tea. Eating and drinking restrictions Follow instructions from your doctor about eating and drinking. These may include:  8 hours before surgery - stop eating heavy meals or foods. These include meat, fried foods, and fatty foods.  6 hours before  surgery - stop eating light meals or foods. These include toast and cereal.  6 hours before surgery - stop drinking milk or drinks that have milk in them.  2 hours before surgery - stop drinking clear liquids. Medicines Ask your doctor about:  Changing or stopping your normal medicines. This is important if you take diabetes medicines or blood thinners.  Taking medicines such as aspirin and ibuprofen. These can thin your blood. Do not take these medicines unless your doctor tells you to take them.  Taking over-the-counter medicines, vitamins, herbs, and supplements. General instructions  You may have a physical exam.  You may have tests, such as: ? X-rays or MRI. ? Blood or urine tests.  Plan to have someone take you home.  Plan to have someone you trust take care of you for at least 24 hours after you leave the hospital or clinic. This is important.  Prepare your home so you can be safe and have easy access to what you need.  Keep your body and teeth clean. Germs from anywhere in your body can infect your  new joint. Tell your doctor if: ? You plan to have dental care and routine cleanings. ? You get any skin infections.  Avoid shaving your legs just before surgery. If any shaving is needed, it will be done in the hospital.  Ask your doctor how your surgical site will be marked or identified. What happens during the procedure?  To lower your risk of infection: ? Your health care team will wash or sanitize their hands. ? Hair may be removed from the surgical area. ? Your skin will be washed with soap.  An IV tube will be put into one of your veins.  You will be given one or more of the following: ? A medicine to help you relax (sedative). ? A medicine to make you fall asleep (general anesthetic). ? A medicine to numb your body below the waist (spinal anesthetic).  Your doctor will make a cut (incision) in your hip. The place where the cut is made will depend on the  approach used by the doctor: ? Posterior approach. The cut will be at the back of the hip. ? Anterior approach. The cut will be at the front of the hip.  Then, your doctor will: ? Use his or her hands to move your hip out of position (dislocate it). ? Cut and take out damaged parts of your hip joint. ? Put a man-made hip joint into place. ? Do an X-ray of the hip joint to make sure it is in the right place. ? Place a drain to remove extra fluid, if needed. ? Close the cut and place a bandage (dressing) over it. The procedure may vary among doctors and hospitals. What happens after the procedure?  Your health care team will: ? Monitor you until you leave the hospital. ? Check your blood pressure, heart rate, breathing rate, and blood oxygen level. ? Check if you can move your foot and can feel sensations in it. ? Give you pain medicine.  Your doctor will tell you to take actions to help prevent blood clots and reduce swelling in your legs. You may need to: ? Wear a type of socks that are tight (compression stockings). ? Take medicines to thin your blood (anticoagulants).  You will do exercises (physical therapy) until you are doing well. Your doctor will tell you when you are well enough to go home.  You may need to use a walker or crutches.  You may need to use a wedge pillow (hip abduction pillow) when you are in bed. This pillow will keep your legs from turning in ways that may cause your new hip joint to move out of place. Summary  Total hip replacement is a surgery to replace your damaged hip joint. Your hip joint is replaced with a man-made (artificial) hip joint.  Follow instructions from your doctor about eating and drinking before the procedure.  Plan to have someone take you home from the hospital.  You may need to use a walker or crutches after surgery. This information is not intended to replace advice given to you by your health care provider. Make sure you discuss  any questions you have with your health care provider. Document Revised: 08/12/2018 Document Reviewed: 05/15/2017 Elsevier Patient Education  2020 [employer].

## 2019-08-05 NOTE — Progress Notes (Signed)
Derrick Dean  08/05/2019  Body mass index is 23.01 kg/m.   HISTORY SECTION :  Chief Complaint  Patient presents with  . Groin Pain    right   . Back Pain    right buttock / back    79 year old male from the [place] system presents with 3 complaints buttock pain associated with lower back pain and radiation of pain in his right leg groin pain and right knee pain.  He is unaware that he had a laminotomy laminectomy in 2018 in [place].  He says he had laser surgery but his op note indicates he had left laminotomies and foraminotomies at L3 and 4 bilaterally  He still has the right sided leg pain  He also has right-sided groin pain but his initial location of pain he pointed to his lower back and ran his hand down his leg.  He also has some medial right knee pain.  He says he is in a lot of discomfort he is tired of being uncomfortable.  He is using a cane      Review of Systems  Musculoskeletal: Positive for back pain, falls and joint pain.  All other systems reviewed and are negative.    has a past medical history of Anemia, Appendicitis, Arthritis, B12 nutritional deficiency, GERD (gastroesophageal reflux disease), Hepatitis C infection, History of heart artery stent, Hypertension, Presbyopia, and Trouble in sleeping.   Past Surgical History:  Procedure Laterality Date  . APPENDECTOMY    . CARDIAC CATHETERIZATION     7 YRS AGO W/STENT  . Left shoulder surgery    . Right Shoulder surgery    . TOTAL KNEE ARTHROPLASTY Bilateral     Body mass index is 23.01 kg/m.   Allergies  Allergen Reactions  . Lisinopril     Made me feel funny     Current Outpatient Medications:  .  allopurinol (ZYLOPRIM) 300 MG tablet, Take 300 mg by mouth daily., Disp: , Rfl:  .  amLODipine (NORVASC) 10 MG tablet, Take 5 mg by mouth daily., Disp: , Rfl:  .  aspirin EC 81 MG tablet, Take 81 mg by mouth daily., Disp: , Rfl:  .  capsicum (ZOSTRIX) 0.075 % topical cream, Apply 1  application topically 2 (two) times daily as needed. For pain, Disp: , Rfl:  .  cetirizine (ZYRTEC) 10 MG tablet, Take 1 tablet (10 mg total) by mouth daily., Disp: 5 tablet, Rfl: 0 .  cholecalciferol (VITAMIN D) 1000 UNITS tablet, Take 1,000 Units by mouth daily., Disp: , Rfl:  .  cyclobenzaprine (FLEXERIL) 10 MG tablet, Take 10 mg by mouth 3 (three) times daily as needed for muscle spasms., Disp: , Rfl:  .  gabapentin (NEURONTIN) 400 MG capsule, Take 400 mg by mouth 3 (three) times daily., Disp: , Rfl:  .  HYDROcodone-acetaminophen (NORCO/VICODIN) 5-325 MG per tablet, Take 1 tablet by mouth every 4 (four) hours as needed., Disp: 10 tablet, Rfl: 0 .  mirtazapine (REMERON) 15 MG tablet, Take 15 mg by mouth at bedtime., Disp: , Rfl:  .  naproxen (NAPROSYN) 500 MG tablet, Take 500 mg by mouth 2 (two) times daily as needed for moderate pain., Disp: , Rfl:  .  omeprazole (PRILOSEC) 20 MG capsule, Take 20 mg by mouth daily., Disp: , Rfl:  .  omeprazole (PRILOSEC) 20 MG capsule, Take 1 capsule (20 mg total) by mouth daily., Disp: 30 capsule, Rfl: 0 .  polyethylene glycol powder (MIRALAX) powder, Take 17 g by mouth daily., Disp:  255 g, Rfl: 0 .  pravastatin (PRAVACHOL) 80 MG tablet, Take 40 mg by mouth at bedtime., Disp: , Rfl:  .  prazosin (MINIPRESS) 1 MG capsule, Take 1 mg by mouth at bedtime., Disp: , Rfl:  .  tamsulosin (FLOMAX) 0.4 MG CAPS capsule, Take 1 capsule (0.4 mg total) by mouth daily., Disp: 7 capsule, Rfl: 0 .  vitamin B-12 (CYANOCOBALAMIN) 1000 MCG tablet, Take 1,000 mcg by mouth daily., Disp: , Rfl:    PHYSICAL EXAM SECTION: 1) Temp (!) 87.2 F (30.7 C)   Ht 5\' 11"  (1.803 m)   Wt 165 lb (74.8 kg)   BMI 23.01 kg/m   Body mass index is 23.01 kg/m. General appearance: Well-developed well-nourished no gross deformities  2) Cardiovascular normal pulse and perfusion , normal color   3) Neurologically deep tendon reflexes are equal and normal, no sensation loss or deficits no  pathologic reflexes  4) Psychological: Awake alert and oriented x3 mood and affect normal  5) Skin no lacerations or ulcerations no nodularity no palpable masses, no erythema or nodularity  6) Musculoskeletal:   Left lower extremity and right lower extremity are equal length.  Slight external rotation to both lower extremities in the supine position.  Left hip flexion is painless.  Scar left knee healed left total knee stable with flexion arc of 125 degrees  Right knee flexion arc 125 degrees there is a scar anteriorly but he is not sure where it came from.  He has a small mass right groin not consistent with hernia as he was normal with coughing its not painful it may be a lipoma it is not a lymph node  His hip flexion gave him mild discomfort as it internal rotation with groin pain  The flexion was up to 100 degrees and then became more difficult  His lower back is tender his right side of his lower back is tender as is his right buttock.  He had discomfort but without radiculopathy in the right leg normal on the left leg  Right knee is tender over the medial joint line there is no effusion his right knee is stable his muscle tone and strength are normal bilaterally   MEDICAL DECISION MAKING  A.  Encounter Diagnoses  Name Primary?  . Degenerative lumbar spinal stenosis Yes  . Chronic hip pain, right   . Arthritis of right hip     B. DATA ANALYSED:  IMAGING: Independent interpretation of images: A hip x-ray was taken on January 05, 2019 I have taken pictures and put them in the media section and that shows arthritis of the right hip  I also reviewed his records from 2018 which indicated that he had a bilateral foraminotomy at L3-4   Orders: MRI lumbar  Outside records reviewed: Yes as stated  C. MANAGEMENT  My sequence of events would be to correct his lumbar radiculopathy, then his hip and then his knee  MRI to image the lumbar spine in preparation for consultation  with neurosurgery.  No orders of the defined types were placed in this encounter.     Arther Abbott, MD  08/05/2019 4:40 PM

## 2019-08-14 ENCOUNTER — Telehealth: Payer: Self-pay | Admitting: Radiology

## 2019-08-14 NOTE — Telephone Encounter (Signed)
I called patient to let him know [facility] needs [facility] his [facility] pain is from his back and he needs approval to be seen for his back. His approval here is only for his hip.  He states he understands and will contact the [place], he has already called them once but states he will call them again.

## 2019-11-03 ENCOUNTER — Other Ambulatory Visit: Payer: Self-pay

## 2019-11-03 ENCOUNTER — Encounter (HOSPITAL_COMMUNITY): Payer: Self-pay | Admitting: *Deleted

## 2019-11-03 ENCOUNTER — Emergency Department (HOSPITAL_COMMUNITY)
Admission: EM | Admit: 2019-11-03 | Discharge: 2019-11-03 | Disposition: A | Discharge status: Home / Self Care | Payer: No Typology Code available for payment source | Attending: Emergency Medicine | Admitting: Emergency Medicine

## 2019-11-03 DIAGNOSIS — Z79899 Other long term (current) drug therapy: Secondary | ICD-10-CM | POA: Insufficient documentation

## 2019-11-03 DIAGNOSIS — I10 Essential (primary) hypertension: Secondary | ICD-10-CM | POA: Insufficient documentation

## 2019-11-03 DIAGNOSIS — I2510 Atherosclerotic heart disease of native coronary artery without angina pectoris: Secondary | ICD-10-CM | POA: Diagnosis not present

## 2019-11-03 DIAGNOSIS — Z87891 Personal history of nicotine dependence: Secondary | ICD-10-CM | POA: Insufficient documentation

## 2019-11-03 NOTE — Discharge Instructions (Addendum)
As discussed, take your prescribed blood pressure as directed tonight and call your primary provider tomorrow morning to discuss switching you back to the amlodipine

## 2019-11-03 NOTE — ED Provider Notes (Signed)
[facility] EMERGENCY DEPARTMENT Provider Note   CSN: [id number] Arrival date & time: 11/03/19  1413     History Chief Complaint  Patient presents with  . Hypertension    Derrick Dean is Dean 79 y.o. male.  HPI      Derrick Dean is Dean 79 y.o. male who presents to the Emergency Department requesting evaluation for high blood pressure.  He states that he has been taking amlodipine for his hypertension for quite some time, but was recently developing edema of his lower extremities which was felt to be secondary to his calcium channel blocker.  His amlodipine was discontinued and he was started on new blood pressure medication 2 weeks ago.  Since that time, he states his blood pressure has remained elevated.  He contacted his PCPs office and stated that he wanted to go back on his amlodipine, but was advised to come to the emergency department for evaluation.  He denies any symptoms     Past Medical History:  Diagnosis Date  . Anemia   . Appendicitis   . Arthritis   . B12 nutritional deficiency    on supplement  . GERD (gastroesophageal reflux disease)   . Hepatitis C infection    not treated - pending appointment in [place] 2015  . History of heart artery stent   . Hypertension   . Presbyopia    wears bifocals  . Trouble in sleeping     Patient Active Problem List   Diagnosis Date Noted  . Chronic midline low back pain without sciatica 05/24/2018  . Lumbar spondylosis 05/24/2018  . Pain syndrome, chronic 06/15/2017  . 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/15/2017  . Sprain of right wrist 10/31/2016  . 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  . S/P ORIF (open reduction internal fixation) fracture 05/05/2016  . Closed fracture of right distal radius 04/18/2016  . Common cold 09/25/2013  . CAD S/P percutaneous coronary angioplasty 09/25/2013  . HCV (hepatitis C virus) 05/15/2013  . Cholelithiasis 05/15/2013  . Abdominal pain, epigastric 05/15/2013  . Pancreatitis,  acute 05/13/2013  . Unspecified essential hypertension 05/13/2013  . Transaminasemia 05/13/2013  . Chest pain 05/12/2013    Past Surgical History:  Procedure Laterality Date  . APPENDECTOMY    . CARDIAC CATHETERIZATION     7 YRS AGO W/STENT  . Left shoulder surgery    . Right Shoulder surgery    . TOTAL KNEE ARTHROPLASTY Bilateral        Family History  Problem Relation Age of Onset  . Coronary artery disease Mother   . Thyroid disease Mother   . Heart disease Mother   . Coronary artery disease Father   . Heart disease Father     Social History   Tobacco Use  . Smoking status: Former Smoker    Quit date: 09/14/2001    Years since quitting: 18.1  . Smokeless tobacco: Never Used  Vaping Use  . Vaping Use: Never used  Substance Use Topics  . Alcohol use: No    Comment: former  . 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 Authorizing Provider  allopurinol (ZYLOPRIM) 300 MG tablet Take 300 mg by mouth daily.    [provider]  amLODipine (NORVASC) 10 MG tablet Take 5 mg by mouth daily.    [provider]  aspirin EC 81  MG tablet Take 81 mg by mouth daily.    [provider]  capsicum (ZOSTRIX) 0.075 % topical cream Apply 1 application topically 2 (two) times daily as needed. For pain    [provider]  cetirizine (ZYRTEC) 10 MG tablet Take 1 tablet (10 mg total) by mouth daily. 09/26/13   Regalado, Belkys A, MD  cholecalciferol (VITAMIN D) 1000 UNITS tablet Take 1,000 Units by mouth daily.    [provider]  cyclobenzaprine (FLEXERIL) 10 MG tablet Take 10 mg by mouth 3 (three) times daily as needed for muscle spasms.    [provider]  gabapentin (NEURONTIN) 400 MG capsule Take 400 mg by mouth 3 (three) times daily.    [provider]  HYDROcodone-acetaminophen (NORCO/VICODIN) 5-325  MG per tablet Take 1 tablet by mouth every 4 (four) hours as needed. 11/20/13   Derrick Porter, MD  mirtazapine (REMERON) 15 MG tablet Take 15 mg by mouth at bedtime.    [provider]  naproxen (NAPROSYN) 500 MG tablet Take 500 mg by mouth 2 (two) times daily as needed for moderate pain.    [provider]  omeprazole (PRILOSEC) 20 MG capsule Take 20 mg by mouth daily.    [provider]  omeprazole (PRILOSEC) 20 MG capsule Take 1 capsule (20 mg total) by mouth daily. 11/04/14   Palumbo, April, MD  polyethylene glycol powder (MIRALAX) powder Take 17 g by mouth daily. 11/04/14   Palumbo, April, MD  pravastatin (PRAVACHOL) 80 MG tablet Take 40 mg by mouth at bedtime.    [provider]  prazosin (MINIPRESS) 1 MG capsule Take 1 mg by mouth at bedtime.    [provider]  tamsulosin (FLOMAX) 0.4 MG CAPS capsule Take 1 capsule (0.4 mg total) by mouth daily. 11/20/13   Derrick Porter, MD  vitamin B-12 (CYANOCOBALAMIN) 1000 MCG tablet Take 1,000 mcg by mouth daily.    [provider]    Allergies    Lisinopril  Review of Systems   Review of Systems  Constitutional: Negative for chills, fatigue and fever.  HENT: Negative for trouble swallowing.   Respiratory: Negative for cough, shortness of breath and wheezing.   Cardiovascular: Negative for chest pain, palpitations and leg swelling.  Gastrointestinal: Negative for abdominal pain, nausea and vomiting.  Genitourinary: Negative for dysuria, flank pain and hematuria.  Musculoskeletal: Negative for arthralgias and myalgias.  Skin: Negative for rash.  Neurological: Negative for dizziness, facial asymmetry, speech difficulty, weakness, numbness and headaches.  Psychiatric/Behavioral: Negative for confusion.    Physical Exam Updated Vital Signs BP (!) 206/88   Pulse 67   Temp 98.2 F (36.8 C) (Oral)   Resp 16   Ht 5\' 4"  (1.626 m)   Wt 74.8 kg   SpO2 100%   BMI 28.32 kg/m   Physical Exam Vitals  and nursing note reviewed.  Constitutional:      Appearance: Normal appearance. He is not ill-appearing or toxic-appearing.  HENT:     Head: Normocephalic.     Mouth/Throat:     Mouth: Mucous membranes are moist.  Eyes:     Extraocular Movements: Extraocular movements intact.     Pupils: Pupils are equal, round, and reactive to light.  Neck:     Thyroid: No thyromegaly.     Meningeal: Kernig's sign absent.  Cardiovascular:     Rate and Rhythm: Normal rate and regular rhythm.     Pulses: Normal pulses.  Pulmonary:     Effort: Pulmonary  effort is normal. No respiratory distress.     Breath sounds: Normal breath sounds.  Abdominal:     Palpations: Abdomen is soft.     Tenderness: There is no abdominal tenderness. There is no guarding or rebound.  Musculoskeletal:        General: Normal range of motion.     Cervical back: Normal range of motion and neck supple.     Right lower leg: No edema.     Left lower leg: No edema.  Skin:    General: Skin is warm.     Capillary Refill: Capillary refill takes less than 2 seconds.     Findings: No rash.  Neurological:     Mental Status: He is alert.     Sensory: Sensation is intact. No sensory deficit.     Motor: Motor function is intact. No weakness.     Comments: CN II through 12 intact.  Speech clear.  Normal mentation.  No pronator drift.  Normal finger-nose testing.  No ataxia     ED Results / Procedures / Treatments   Labs (all labs ordered are listed, but only abnormal results are displayed) Labs Reviewed - No data to display  EKG None  Radiology No results found.  Procedures Procedures (including critical care time)  Medications Ordered in ED Medications - No data to display  ED Course  I have reviewed the triage vital signs and the nursing notes.  Pertinent labs & imaging results that were available during my care of the patient were reviewed by me and considered in my medical decision making (see chart for  details).    MDM Rules/Calculators/Dean&P                          Pt with asymptomatic hypertension.  Had Dean medication change 2 weeks ago and BP has been elevated since then.  Had BMP 3 days ago, results reviewed by me,  kidney function wnml.  Systolic pressure 191 currently,  No focal neuro deficits.  Asymptomatic. Well appearing.  I feel that he is appropriate for d/c home.  I have recommended that he continue his current antihypertensive medication and f/u with is PCP tomorrow.     Final Clinical Impression(s) / ED Diagnoses Final diagnoses:  Hypertension, unspecified type    Rx / DC Orders ED Discharge Orders    None       Rosey Bath 11/06/19 1116    Raeford Razor, MD 11/08/19 2044

## 2019-11-03 NOTE — ED Triage Notes (Signed)
States he recently had a change in his medication, states his pressure ws up today

## 2020-01-07 DIAGNOSIS — G894 Chronic pain syndrome: Secondary | ICD-10-CM | POA: Diagnosis not present

## 2020-01-07 DIAGNOSIS — M069 Rheumatoid arthritis, unspecified: Secondary | ICD-10-CM | POA: Diagnosis not present

## 2020-01-07 DIAGNOSIS — Z79899 Other long term (current) drug therapy: Secondary | ICD-10-CM | POA: Diagnosis not present

## 2020-01-07 DIAGNOSIS — M1611 Unilateral primary osteoarthritis, right hip: Secondary | ICD-10-CM | POA: Diagnosis not present

## 2020-01-07 DIAGNOSIS — M1711 Unilateral primary osteoarthritis, right knee: Secondary | ICD-10-CM | POA: Diagnosis not present

## 2020-01-27 HISTORY — PX: OTHER SURGICAL HISTORY: SHX169

## 2020-04-10 ENCOUNTER — Emergency Department (HOSPITAL_COMMUNITY): Payer: No Typology Code available for payment source

## 2020-04-10 ENCOUNTER — Encounter (HOSPITAL_COMMUNITY): Payer: Self-pay | Admitting: Emergency Medicine

## 2020-04-10 ENCOUNTER — Other Ambulatory Visit: Payer: Self-pay

## 2020-04-10 ENCOUNTER — Emergency Department (HOSPITAL_COMMUNITY)
Admission: EM | Admit: 2020-04-10 | Discharge: 2020-04-10 | Disposition: A | Discharge status: Home / Self Care | Payer: No Typology Code available for payment source | Attending: Emergency Medicine | Admitting: Emergency Medicine

## 2020-04-10 DIAGNOSIS — M25551 Pain in right hip: Secondary | ICD-10-CM | POA: Insufficient documentation

## 2020-04-10 DIAGNOSIS — Z96641 Presence of right artificial hip joint: Secondary | ICD-10-CM | POA: Insufficient documentation

## 2020-04-10 DIAGNOSIS — M25512 Pain in left shoulder: Secondary | ICD-10-CM | POA: Diagnosis not present

## 2020-04-10 DIAGNOSIS — I10 Essential (primary) hypertension: Secondary | ICD-10-CM | POA: Diagnosis not present

## 2020-04-10 DIAGNOSIS — Z79899 Other long term (current) drug therapy: Secondary | ICD-10-CM | POA: Diagnosis not present

## 2020-04-10 DIAGNOSIS — I2510 Atherosclerotic heart disease of native coronary artery without angina pectoris: Secondary | ICD-10-CM | POA: Insufficient documentation

## 2020-04-10 DIAGNOSIS — R519 Headache, unspecified: Secondary | ICD-10-CM | POA: Diagnosis not present

## 2020-04-10 DIAGNOSIS — M542 Cervicalgia: Secondary | ICD-10-CM | POA: Insufficient documentation

## 2020-04-10 DIAGNOSIS — Z7982 Long term (current) use of aspirin: Secondary | ICD-10-CM | POA: Insufficient documentation

## 2020-04-10 DIAGNOSIS — Z87891 Personal history of nicotine dependence: Secondary | ICD-10-CM | POA: Insufficient documentation

## 2020-04-10 DIAGNOSIS — Z20822 Contact with and (suspected) exposure to covid-19: Secondary | ICD-10-CM | POA: Diagnosis not present

## 2020-04-10 DIAGNOSIS — Z955 Presence of coronary angioplasty implant and graft: Secondary | ICD-10-CM | POA: Insufficient documentation

## 2020-04-10 LAB — RESP PANEL BY RT-PCR (FLU A&B, COVID) ARPGX2
Influenza A by PCR: NEGATIVE
Influenza B by PCR: NEGATIVE
SARS Coronavirus 2 by RT PCR: NEGATIVE

## 2020-04-10 LAB — PROTIME-INR
INR: 1.1 (ref 0.8–1.2)
Prothrombin Time: 13.6 seconds (ref 11.4–15.2)

## 2020-04-10 LAB — BASIC METABOLIC PANEL
Anion gap: 7 (ref 5–15)
BUN: 7 mg/dL — ABNORMAL LOW (ref 8–23)
CO2: 26 mmol/L (ref 22–32)
Calcium: 8.5 mg/dL — ABNORMAL LOW (ref 8.9–10.3)
Chloride: 104 mmol/L (ref 98–111)
Creatinine, Ser: 0.76 mg/dL (ref 0.61–1.24)
GFR, Estimated: >60 mL/min (ref >60)
Glucose, Bld: 144 mg/dL — ABNORMAL HIGH (ref 70–99)
Potassium: 3.3 mmol/L — ABNORMAL LOW (ref 3.5–5.1)
Sodium: 137 mmol/L (ref 135–145)

## 2020-04-10 LAB — CBC WITH DIFFERENTIAL/PLATELET
Abs Immature Granulocytes: 0.03 10*3/uL (ref 0.00–0.07)
Basophils Absolute: 0 10*3/uL (ref 0.0–0.1)
Basophils Relative: 0 %
Eosinophils Absolute: 0 10*3/uL (ref 0.0–0.5)
Eosinophils Relative: 0 %
HCT: 37.4 % — ABNORMAL LOW (ref 39.0–52.0)
Hemoglobin: 12 g/dL — ABNORMAL LOW (ref 13.0–17.0)
Immature Granulocytes: 0 %
Lymphocytes Relative: 13 %
Lymphs Abs: 1.1 10*3/uL (ref 0.7–4.0)
MCH: 29.9 pg (ref 26.0–34.0)
MCHC: 32.1 g/dL (ref 30.0–36.0)
MCV: 93.3 fL (ref 80.0–100.0)
Monocytes Absolute: 1 10*3/uL (ref 0.1–1.0)
Monocytes Relative: 11 %
Neutro Abs: 6.6 10*3/uL (ref 1.7–7.7)
Neutrophils Relative %: 76 %
Platelets: 298 10*3/uL (ref 150–400)
RBC: 4.01 MIL/uL — ABNORMAL LOW (ref 4.22–5.81)
RDW: 15.5 % (ref 11.5–15.5)
WBC: 8.7 10*3/uL (ref 4.0–10.5)
nRBC: 0 % (ref 0.0–0.2)

## 2020-04-10 LAB — LACTIC ACID, PLASMA: Lactic Acid, Venous: 1.5 mmol/L (ref 0.5–1.9)

## 2020-04-10 MED ORDER — GADOBUTROL 1 MMOL/ML IV SOLN
7.5000 mL | Freq: Once | INTRAVENOUS | Status: AC | PRN
  Administered 2020-04-10: 7.5 mL via INTRAVENOUS

## 2020-04-10 MED ORDER — ACETAMINOPHEN 325 MG PO TABS
650.0000 mg | ORAL_TABLET | Freq: Once | ORAL | Status: AC
  Administered 2020-04-10: 08:00:00 650 mg via ORAL
  Filled 2020-04-10: qty 2

## 2020-04-10 MED ORDER — METHOCARBAMOL 500 MG PO TABS: 500.0000 mg | ORAL_TABLET | Freq: Two times a day (BID) | ORAL | 0 refills | Status: DC

## 2020-04-10 MED ORDER — FENTANYL CITRATE (PF) 100 MCG/2ML IJ SOLN
50.0000 ug | Freq: Once | INTRAMUSCULAR | Status: AC
  Administered 2020-04-10: 09:00:00 50 ug via INTRAVENOUS
  Filled 2020-04-10: qty 2

## 2020-04-10 MED ORDER — IOHEXOL 300 MG/ML  SOLN
75.0000 mL | Freq: Once | INTRAMUSCULAR | Status: AC | PRN
  Administered 2020-04-10: 75 mL via INTRAVENOUS

## 2020-04-10 NOTE — ED Notes (Signed)
Pt in MRI at this time 

## 2020-04-10 NOTE — ED Provider Notes (Signed)
79 year old male transferred from [facility] after being evaluated by Dr. Cherylynn Ridges.  He has had neck pain for a while but worse over the last few days.  Also complaining of some right hip pain after hip replacement few months ago.  No recent known trauma.  Painful to move the neck.  Apparently at [facility] had some imaging that showed a possible epidural abscess.  Patient does have a low-grade fever.  He was transferred to [facility] to get an MRI.  He is nontoxic-appearing. Physical Exam  BP (!) 159/74 (BP Location: Right Arm)   Pulse 74   Temp 98.6 F (37 C) (Oral)   Resp 14   Ht 5\' 11"  (1.803 m)   Wt 74.8 kg   SpO2 98%   BMI 23.01 kg/m   Physical Exam  ED Course/Procedures    MR Cervical Spine W or Wo Contrast  Final Result    CT Soft Tissue Neck W Contrast  Final Result    CT Head Wo Contrast  Final Result    DG Hip Unilat  With Pelvis 2-3 Views Right  Final Result    DG Chest 2 View  Final Result      Procedures  MDM  Patient's MRI does not show any signs of epidural abscess.  Reviewed case with Dr. neurosurgery.  He said the patient does not have any emergent indications for admission.  [facility] and [facility] him in the [facility].  I reviewed this with the patient is comfortable plan for muscle relaxants and [facility]-up outpatient with [facility] and [facility].  Return instructions discussed.       Franky Macho, MD 04/10/20 2148

## 2020-04-10 NOTE — ED Notes (Addendum)
Pt brought to this ED from ED at [facility]. Pt's car is in [place]. Spoke with case management who is unable to help as she is not on campus, suggested that he get a taxi voucher from [employer]. Charge nurse cannot validate a taxi for that far away. Social work consulted. Waiting on response. Pt moved to hallway bed, given discharge papers and is awaiting discharge when transport can be arranged.

## 2020-04-10 NOTE — ED Provider Notes (Signed)
[facility] EMERGENCY DEPARTMENT Provider Note   CSN: [id number] Arrival date & time: 04/10/20  0720     History Chief Complaint  Patient presents with  . Hip Pain    C/O Right Hip Pain and Left Shoulder Pain    Derrick Dean is a 79 y.o. male.  HPI 79 year old male presents with neck pain and right hip pain.  In October he had his right hip replaced.  Is been hurting ever since but worse over the last 3 days.  Over the last 3 days he is also had neck pain, worse on the right posterior aspect.  It is painful to move his neck.  There is no sore throat or headache or fever.  No weakness or numbness.  He falls fairly often and states he is a "fall risk" but is not sure if he actually injured his neck on a fall.   Past Medical History:  Diagnosis Date  . Anemia   . Appendicitis   . Arthritis   . B12 nutritional deficiency    on supplement  . GERD (gastroesophageal reflux disease)   . Hepatitis C infection    not treated - pending appointment in april 2015  . History of heart artery stent   . Hypertension   . Presbyopia    wears bifocals  . Trouble in sleeping     Patient Active Problem List   Diagnosis Date Noted  . Chronic midline low back pain without sciatica 05/24/2018  . Lumbar spondylosis 05/24/2018  . Pain syndrome, chronic 06/15/2017  . 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/15/2017  . Sprain of right wrist 10/31/2016  . 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  . S/P ORIF (open reduction internal fixation) fracture 05/05/2016  . Closed fracture of right distal radius 04/18/2016  . Common cold 09/25/2013  . CAD S/P percutaneous coronary angioplasty 09/25/2013  . HCV (hepatitis C virus) 05/15/2013  . Cholelithiasis 05/15/2013  . Abdominal pain, epigastric 05/15/2013  . Pancreatitis, acute 05/13/2013  . Unspecified essential hypertension 05/13/2013  . Transaminasemia 05/13/2013  . Chest pain 05/12/2013    Past Surgical History:  Procedure Laterality Date   . APPENDECTOMY    . CARDIAC CATHETERIZATION     7 YRS AGO W/STENT  . Left shoulder surgery    . 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 Right Shoulder surgery    . TOTAL KNEE ARTHROPLASTY Bilateral        Family History  Problem Relation Age of Onset  . Coronary artery disease Mother   . Thyroid disease Mother   . Heart disease Mother   . Coronary artery disease Father   . Heart disease Father     Social History   Tobacco Use  . Smoking status: Former Smoker    Quit date: 09/14/2001    Years since quitting: 18.5  . Smokeless tobacco: Never Used  Vaping Use  . Vaping Use: Never used  Substance Use Topics  . Alcohol use: Yes    Alcohol/week: 1.0 standard drink    Types: 1 Cans of beer per week    Comment: Nightly  . 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 Authorizing Provider  allopurinol (ZYLOPRIM) 300 MG tablet Take 300 mg by mouth daily.    [provider]  amLODipine (NORVASC) 10 MG tablet Take 5 mg by mouth daily.  [provider]  aspirin EC 81 MG tablet Take 81 mg by mouth daily.    [provider]  capsicum (ZOSTRIX) 0.075 % topical cream Apply 1 application topically 2 (two) times daily as needed. For pain    [provider]  cetirizine (ZYRTEC) 10 MG tablet Take 1 tablet (10 mg total) by mouth daily. 09/26/13   Regalado, Belkys A, MD  cholecalciferol (VITAMIN D) 1000 UNITS tablet Take 1,000 Units by mouth daily.    [provider]  cyclobenzaprine (FLEXERIL) 10 MG tablet Take 10 mg by mouth 3 (three) times daily as needed for muscle spasms.    [provider]  gabapentin (NEURONTIN) 400 MG capsule Take 400 mg by mouth 3 (three) times daily.    [provider]  HYDROcodone-acetaminophen (NORCO/VICODIN) 5-325 MG per tablet Take 1 tablet by mouth every 4 (four) hours as  needed. 11/20/13   Rolland Porter, MD  mirtazapine (REMERON) 15 MG tablet Take 15 mg by mouth at bedtime.    [provider]  naproxen (NAPROSYN) 500 MG tablet Take 500 mg by mouth 2 (two) times daily as needed for moderate pain.    [provider]  omeprazole (PRILOSEC) 20 MG capsule Take 20 mg by mouth daily.    [provider]  omeprazole (PRILOSEC) 20 MG capsule Take 1 capsule (20 mg total) by mouth daily. 11/04/14   Palumbo, April, MD  polyethylene glycol powder (MIRALAX) powder Take 17 g by mouth daily. 11/04/14   Palumbo, April, MD  pravastatin (PRAVACHOL) 80 MG tablet Take 40 mg by mouth at bedtime.    [provider]  prazosin (MINIPRESS) 1 MG capsule Take 1 mg by mouth at bedtime.    [provider]  tamsulosin (FLOMAX) 0.4 MG CAPS capsule Take 1 capsule (0.4 mg total) by mouth daily. 11/20/13   Rolland Porter, MD  vitamin B-12 (CYANOCOBALAMIN) 1000 MCG tablet Take 1,000 mcg by mouth daily.    [provider]    Allergies    Lisinopril  Review of Systems   Review of Systems  Constitutional: Negative for fever.  Musculoskeletal: Positive for arthralgias and neck pain.  Neurological: Negative for weakness, numbness and headaches.  All other systems reviewed and are negative.   Physical Exam Updated Vital Signs BP (!) 154/66   Pulse 78   Temp (!) 103.3 F (39.6 C) (Rectal)   Resp 20   Ht 5\' 11"  (1.803 m)   Wt 74.8 kg   SpO2 98%   BMI 23.01 kg/m   Physical Exam Vitals and nursing note reviewed.  Constitutional:      Appearance: He is well-developed and well-nourished.  HENT:     Head: Normocephalic and atraumatic.     Right Ear: External ear normal.     Left Ear: External ear normal.     Nose: Nose normal.     Mouth/Throat:     Pharynx: Oropharynx is clear. No oropharyngeal exudate or posterior oropharyngeal erythema.  Eyes:     General:        Right eye: No discharge.        Left eye: No discharge.  Neck:       Comments: Bilateral posterior neck tenderness to palpation, worst on right Cardiovascular:     Rate and Rhythm: Normal rate and regular rhythm.     Heart sounds: Normal heart sounds.  Pulmonary:     Effort: Pulmonary effort is normal.     Breath sounds: Normal breath sounds.  Abdominal:     Palpations: Abdomen is soft.     Tenderness: There is no abdominal tenderness.  Musculoskeletal:        General: No edema.     Cervical back: No edema, erythema or crepitus. Pain with movement present. Decreased range of motion.     Right hip: No tenderness. Normal range of motion.     Comments: Right hip surgical scar appears benign. No swelling, erythema. Normal passive ROM without pain  Skin:    General: Skin is warm and dry.  Neurological:     Mental Status: He is alert.     Comments: 5/5 strength in all 4 extremities. No slurred speech  Psychiatric:        Mood and Affect: Mood is not anxious.     ED Results / Procedures / Treatments   Labs (all labs ordered are listed, but only abnormal results are displayed) Labs Reviewed  BASIC METABOLIC PANEL - Abnormal; Notable for the following components:      Result Value   Potassium 3.3 (*)    Glucose, Bld 144 (*)    BUN 7 (*)    Calcium 8.5 (*)    All other components within normal limits  CBC WITH DIFFERENTIAL/PLATELET - Abnormal; Notable for the following components:   RBC 4.01 (*)    Hemoglobin 12.0 (*)    HCT 37.4 (*)    All other components within normal limits  CULTURE, BLOOD (ROUTINE X 2)  CULTURE, BLOOD (ROUTINE X 2)  RESP PANEL BY RT-PCR (FLU A&B, COVID) ARPGX2  LACTIC ACID, PLASMA  PROTIME-INR  URINALYSIS, ROUTINE W REFLEX MICROSCOPIC    EKG EKG Interpretation  Date/Time:  Saturday April 10 2020 08:19:23 EST Ventricular Rate:  79 PR Interval:    QRS Duration: 76 QT Interval:  344 QTC Calculation: 395 R Axis:   78 Text Interpretation: Sinus rhythm Biatrial enlargement LVH with secondary repolarization  abnormality similar to July 2019 Confirmed by Pricilla Loveless [phone]) on 04/10/2020 8:32:01 AM   Radiology DG Chest 2 View  Result Date: 04/10/2020 CLINICAL DATA:  Fever.  Recent surgery EXAM: CHEST - 2 VIEW COMPARISON:  09/25/2013 FINDINGS: Heart size is within normal limits. Increased interstitial markings bilaterally. No focal airspace consolidation. No pleural effusion or pneumothorax. Advanced degenerative changes of the shoulders, left worse than right. IMPRESSION: Increased interstitial markings bilaterally, which may reflect bronchitic type lung changes versus atypical/infection. Electronically Signed   By: Duanne Guess D.O.   On: 04/10/2020 09:27   CT Head Wo Contrast  Result Date: 04/10/2020 CLINICAL DATA:  Headache.  Right-sided neck pain and stiffness. EXAM: CT HEAD WITHOUT CONTRAST TECHNIQUE: Contiguous axial images were obtained from the base of the skull through the vertex without intravenous contrast. COMPARISON:  12/23/2012 FINDINGS: Brain: Low-density in the periventricular and subcortical white matter is chronic. No evidence for acute hemorrhage, mass lesion, midline shift, hydrocephalus or large infarct. Vascular: No hyperdense vessel or unexpected calcification. Skull: Normal. Negative for fracture or focal lesion. Sinuses/Orbits: Again noted is abnormal soft tissue along the posterior aspect of the right orbit possibly involving the extraocular muscles. There was similar soft tissue in this area on the CT from 2014 suggesting an indolent process but still concerning for underlying soft tissue mass. Normal appearance of the left orbit. Suspect previous right cataract surgery. Paranasal sinuses are clear. Other: None IMPRESSION: 1. No acute intracranial abnormality. 2. **An incidental finding of potential clinical significance has been found. Abnormal soft tissue in the right orbit.  There was a similar finding on the head CT from 2014. The soft tissue is indeterminate. Recommend  further characterization with orbital MRI, with and without contrast.** 3. Extensive low-density in the white matter. Findings are suggestive for chronic small vessel ischemic changes. Electronically Signed   By: Richarda Overlie M.D.   On: 04/10/2020 09:39   CT Soft Tissue Neck W Contrast  Result Date: 04/10/2020 CLINICAL DATA:  Deep tissue neck abscess. Right-sided neck pain and stiffness. EXAM: CT NECK WITH CONTRAST TECHNIQUE: Multidetector CT imaging of the neck was performed using the standard protocol following the bolus administration of intravenous contrast. CONTRAST:  75mL OMNIPAQUE IOHEXOL 300 MG/ML  SOLN COMPARISON:  None. FINDINGS: Pharynx and larynx: No evidence of mass or swelling. Salivary glands: No inflammation, mass, or stone. Thyroid: Normal. Lymph nodes: None enlarged or abnormal density. Vascular: Atheromatous calcification. No emergent finding or major vessel occlusion Limited intracranial: Negative Visualized orbits: Ill-defined soft tissue density infiltrating the right orbit, both intra and extraconal and centered towards the apex. No visible intracranial extension. This is a chronic finding based on 2014 imaging, which effectively excludes lymphoma and pseudotumor. Meningioma, sarcoid, or scarring are thus leading considerations. Mastoids and visualized paranasal sinuses: Negative Skeleton: Advanced disc degeneration with bulky ventral spurring. Multilevel degenerative facet spurring. There is dorsal epidural thickening at the level of C2 and C3, not seen on comparison imaging and low-density where closest to the bone. There may be swelling of posterior paravertebral musculature. Upper chest: Biapical emphysema. IMPRESSION: 1. Dorsal epidural thickening at C2 and C3, not seen on prior imaging. Dorsal epidural abscess is a leading consideration in this clinical setting. 2. 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 Elective orbit MRI with contrast is  recommended, please see differential discussion above. Electronically Signed   By: Marnee Spring M.D.   On: 04/10/2020 09:46   DG Hip Unilat  With Pelvis 2-3 Views Right  Result Date: 04/10/2020 CLINICAL DATA:  Right hip pain after fall 3 days ago EXAM: DG HIP (WITH OR WITHOUT PELVIS) 2-3V RIGHT COMPARISON:  03/18/2020 FINDINGS: Status post right total hip arthroplasty. Arthroplasty components are in their expected alignment. No periprosthetic lucency or fracture is seen. There is increasing heterotopic bone formation adjacent to the lesser tuberosity and superior to the greater tuberosity. Lower lumbar spondylosis. Vascular calcifications. IMPRESSION: Status post right total hip arthroplasty without evidence of hardware complication. Electronically Signed   By: Duanne Guess D.O.   On: 04/10/2020 09:28    Procedures .Critical Care Performed by: Pricilla Loveless, MD Authorized by: Pricilla Loveless, MD   Critical care provider statement:    Critical care time (minutes):  35   Critical care time was exclusive of:  Separately billable procedures and treating other patients   Critical care was necessary to treat or prevent imminent or life-threatening deterioration of the following conditions:  Sepsis and CNS failure or compromise   Critical care was time spent personally by me on the following activities:  Discussions with consultants, evaluation of patient's response to treatment, examination of patient, ordering and performing treatments and interventions, ordering and review of laboratory studies, ordering and review of radiographic studies, pulse oximetry, re-evaluation of patient's condition, obtaining history from patient or surrogate and review of old charts   (including critical care time)  Medications Ordered in ED Medications  fentaNYL (SUBLIMAZE) injection 50 mcg (50 mcg Intravenous Given 04/10/20 0830)  acetaminophen (TYLENOL) tablet 650 mg (650 mg Oral Given 04/10/20 0829)  iohexol (OMNIPAQUE) 300 MG/ML solution 75 mL (75 mLs Intravenous Contrast Given 04/10/20 7048)    ED Course  I have reviewed the triage vital signs and the nursing notes.  Pertinent labs & imaging results that were available during my care of the patient were reviewed by me and considered in my medical decision making (see chart for details).    MDM Rules/Calculators/A&P                          Patient's neuro exam is unremarkable.  He is not ill-appearing but he does have a fever.  With the neck stiffness, CT of the neck was obtained.  I have personally reviewed these images and labs.  His Covid testing is negative.  His CT shows concern for epidural abscess.  I discussed with neurosurgery, Dr. Franky Macho, who indicates he needs MRI prior to any type of surgical or medical intervention.  Given he is not septic, hold on antibiotics and get blood cultures which have been performed.  Needs MRI and then assessment from there.  No need for emergent surgical decompression given benign neuro exam and still do not have definitive diagnosis.  I discussed with EDP at [facility], Dr. Madilyn Hook, who accepts in transfer. Final Clinical Impression(s) / ED Diagnoses Final diagnoses:  None    Rx / DC Orders ED Discharge Orders    None       Pricilla Loveless, MD 04/10/20 1021

## 2020-04-10 NOTE — Discharge Instructions (Addendum)
You were seen in the emergency department for some neck pain and hip pain.  You had an MRI of your cervical spine that did not show any obvious abscess.  They did see some degenerative changes and some narrowing where the nerves come out.  This will need follow-up with your doctors at the [place].  We are also giving you the number for the neurosurgeon who reviewed your films.  Included below is the report.  We are putting you on a muscle relaxant in the short-term but may help with your symptoms.    IMPRESSION:  1. No evidence of epidural abscess or other acute process of the  cervical spine.  2. Multilevel cervical spondylosis as described above, with mild  spinal canal stenosis at C2-C3 and C3-C4.  3. Severe left neural foraminal narrowing at C2-C3. Moderate  bilateral neural foraminal narrowing at C4-C5 and C7-T1.

## 2020-04-10 NOTE — ED Triage Notes (Signed)
Pt presents to the ED ongoing right hip and left shoulder pain. Patient states that he had hip surgery at [facility] on 01/27/2020. Pt able to ambulate.

## 2020-04-15 LAB — CULTURE, BLOOD (ROUTINE X 2)
Culture: NO GROWTH
Culture: NO GROWTH
Special Requests: ADEQUATE
Special Requests: ADEQUATE

## 2020-07-16 ENCOUNTER — Encounter (HOSPITAL_COMMUNITY): Payer: Self-pay | Admitting: *Deleted

## 2020-07-16 ENCOUNTER — Other Ambulatory Visit: Payer: Self-pay

## 2020-07-16 ENCOUNTER — Emergency Department (HOSPITAL_COMMUNITY)
Admission: EM | Admit: 2020-07-16 | Discharge: 2020-07-16 | Disposition: A | Discharge status: Home / Self Care | Payer: No Typology Code available for payment source | Attending: Emergency Medicine | Admitting: Emergency Medicine

## 2020-07-16 DIAGNOSIS — I10 Essential (primary) hypertension: Secondary | ICD-10-CM | POA: Insufficient documentation

## 2020-07-16 DIAGNOSIS — Z79899 Other long term (current) drug therapy: Secondary | ICD-10-CM | POA: Insufficient documentation

## 2020-07-16 DIAGNOSIS — Z87891 Personal history of nicotine dependence: Secondary | ICD-10-CM | POA: Diagnosis not present

## 2020-07-16 DIAGNOSIS — G8918 Other acute postprocedural pain: Secondary | ICD-10-CM | POA: Diagnosis present

## 2020-07-16 MED ORDER — KETOROLAC TROMETHAMINE 30 MG/ML IJ SOLN
15.0000 mg | Freq: Once | INTRAMUSCULAR | Status: AC
  Administered 2020-07-16: 15 mg via INTRAMUSCULAR
  Filled 2020-07-16: qty 1

## 2020-07-16 MED ORDER — NAPROXEN 500 MG PO TABS: 500.0000 mg | ORAL_TABLET | Freq: Two times a day (BID) | ORAL | 0 refills | Status: DC

## 2020-07-16 NOTE — ED Triage Notes (Signed)
He had a penile implant on 3/27. States area is painful and referred here by the [place]

## 2020-07-16 NOTE — Discharge Instructions (Addendum)
Your exam today was not consistent with infection.  Your pain is likely from having surgery. Take naproxen 2 times a day with meals.  Do not take other anti-inflammatories at the same time (Advil, Motrin, ibuprofen, Aleve). You may supplement with Tylenol if you need further pain control. Use ice packs as needed for pain. Call your surgeon to set up a follow-up appointment for reevaluation within the next week. Return to the emergency room if develop fevers, vomiting, pus draining from the area, severe worsening pain, any new, worsening, or concerning symptoms

## 2020-07-16 NOTE — ED Provider Notes (Signed)
[facility] EMERGENCY DEPARTMENT Provider Note   CSN: [id number] Arrival date & time: 07/16/20  1615     History Chief Complaint  Patient presents with  . Post-op Problem    Derrick Dean is a 80 y.o. male presenting for evaluation of pain at the incision site.  Patient states he had surgery on 3/28, 4 days ago.  He continues to have a lot of pain at the incision site, states it is a burning pain.  He was discharged on pain medication, hydrocodone, but he states that did not completely remove his pain and it was making him constipated, so he stopped taking it yesterday.  He has not taken anything since including Tylenol ibuprofen.  Patient states initially he was having dysuria, but this has mostly resolved.  He denies fevers, chills, nausea, vomiting.  He denies drainage from the area.  [facility] at the [facility] clinic in [place].  However patient has limited transportation, as such, he was told to come to [facility].  He is not currently on any antibiotics.  HPI     Past Medical History:  Diagnosis Date  . Anemia   . Appendicitis   . Arthritis   . B12 nutritional deficiency    on supplement  . GERD (gastroesophageal reflux disease)   . Hepatitis C infection    not treated - pending appointment in april 2015  . History of heart artery stent   . Hypertension   . Presbyopia    wears bifocals  . Trouble in sleeping     Patient Active Problem List   Diagnosis Date Noted  . Chronic midline low back pain without sciatica 05/24/2018  . Lumbar spondylosis 05/24/2018  . Pain syndrome, chronic 06/15/2017  . 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/15/2017  . Sprain of right wrist 10/31/2016  . 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  . S/P ORIF (open reduction internal fixation) fracture 05/05/2016  . Closed fracture of right distal radius 04/18/2016  . Common cold 09/25/2013  . CAD S/P percutaneous coronary angioplasty  09/25/2013  . HCV (hepatitis C virus) 05/15/2013  . Cholelithiasis 05/15/2013  . Abdominal pain, epigastric 05/15/2013  . Pancreatitis, acute 05/13/2013  . Unspecified essential hypertension 05/13/2013  . Transaminasemia 05/13/2013  . Chest pain 05/12/2013    Past Surgical History:  Procedure Laterality Date  . APPENDECTOMY    . CARDIAC CATHETERIZATION     7 YRS AGO W/STENT  . Left shoulder surgery    . 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 Right Shoulder surgery    . TOTAL KNEE ARTHROPLASTY Bilateral        Family History  Problem Relation Age of Onset  . Coronary artery disease Mother   . Thyroid disease Mother   . Heart disease Mother   . Coronary artery disease Father   . Heart disease Father     Social History   Tobacco Use  . Smoking status: Former Smoker    Quit date: 09/14/2001    Years since quitting: 18.8  . Smokeless tobacco: Never Used  Vaping Use  . Vaping Use: Never used  Substance Use Topics  . Alcohol use: Yes    Alcohol/week: 1.0 standard drink    Types: 1 Cans of beer per week    Comment: Nightly  . 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 Authorizing Provider  naproxen (NAPROSYN) 500 MG tablet Take 1 tablet (500 mg total) by mouth 2 (two) times daily with a meal. 07/16/20  Yes Leara Rawl, PA-C  allopurinol (ZYLOPRIM) 300 MG tablet Take 300 mg by mouth daily.    [provider]  amLODipine (NORVASC) 10 MG tablet Take 5 mg by mouth daily.    [provider]  aspirin EC 81 MG tablet Take 81 mg by mouth daily.    [provider]  capsicum (ZOSTRIX) 0.075 % topical cream Apply 1 application topically 2 (two) times daily as needed. For pain    [provider]  cetirizine (ZYRTEC) 10 MG tablet Take 1 tablet (10 mg total) by mouth daily. 09/26/13   Regalado, Belkys A, MD   cholecalciferol (VITAMIN D) 1000 UNITS tablet Take 1,000 Units by mouth daily.    [provider]  cyclobenzaprine (FLEXERIL) 10 MG tablet Take 10 mg by mouth 3 (three) times daily as needed for muscle spasms.    [provider]  gabapentin (NEURONTIN) 400 MG capsule Take 400 mg by mouth 3 (three) times daily.    [provider]  HYDROcodone-acetaminophen (NORCO/VICODIN) 5-325 MG per tablet Take 1 tablet by mouth every 4 (four) hours as needed. 11/20/13   Rolland Porter, MD  methocarbamol (ROBAXIN) 500 MG tablet Take 1 tablet (500 mg total) by mouth 2 (two) times daily. 04/10/20   Terrilee Files, MD  mirtazapine (REMERON) 15 MG tablet Take 15 mg by mouth at bedtime.    [provider]  omeprazole (PRILOSEC) 20 MG capsule Take 20 mg by mouth daily.    [provider]  omeprazole (PRILOSEC) 20 MG capsule Take 1 capsule (20 mg total) by mouth daily. 11/04/14   Palumbo, April, MD  polyethylene glycol powder (MIRALAX) powder Take 17 g by mouth daily. 11/04/14   Palumbo, April, MD  pravastatin (PRAVACHOL) 80 MG tablet Take 40 mg by mouth at bedtime.    [provider]  prazosin (MINIPRESS) 1 MG capsule Take 1 mg by mouth at bedtime.    [provider]  tamsulosin (FLOMAX) 0.4 MG CAPS capsule Take 1 capsule (0.4 mg total) by mouth daily. 11/20/13   Rolland Porter, MD  vitamin B-12 (CYANOCOBALAMIN) 1000 MCG tablet Take 1,000 mcg by mouth daily.    [provider]    Allergies    Lisinopril  Review of Systems   Review of Systems  Constitutional: Negative for fever.  Skin: Positive for wound.    Physical Exam Updated Vital Signs BP (!) 125/59   Pulse 80   Temp 98.5 F (36.9 C) (Oral)   Resp 18   SpO2 100%   Physical Exam Vitals and nursing note reviewed. Exam conducted with a chaperone present.  Constitutional:      General: He is not in acute distress.    Appearance: He is well-developed.     Comments: Resting in the bed in  no acute distress  HENT:     Head: Normocephalic and atraumatic.  Pulmonary:     Effort: Pulmonary effort is normal.  Abdominal:     General: There is no distension.     Palpations: Abdomen is soft. There is no mass.     Tenderness: There is no abdominal tenderness. There is no guarding or rebound.     Comments: No TTP of the abdomen  Genitourinary:      Comments: 3-4 cm incision that is healing  well without surrounding erythema, induration, or drainage.  No significant tenderness to palpation of the area.  No crepitus. No lesions noted elsewhere Musculoskeletal:        General: Normal range of motion.     Cervical back: Normal range of motion.  Skin:    General: Skin is warm.     Capillary Refill: Capillary refill takes less than 2 seconds.     Findings: No rash.  Neurological:     Mental Status: He is alert and oriented to person, place, and time.     ED Results / Procedures / Treatments   Labs (all labs ordered are listed, but only abnormal results are displayed) Labs Reviewed - No data to display  EKG None  Radiology No results found.  Procedures Procedures   Medications Ordered in ED Medications  ketorolac (TORADOL) 30 MG/ML injection 15 mg (15 mg Intramuscular Given 07/16/20 1659)    ED Course  I have reviewed the triage vital signs and the nursing notes.  Pertinent labs & imaging results that were available during my care of the patient were reviewed by me and considered in my medical decision making (see chart for details).    MDM Rules/Calculators/A&P                          Patient presented for evaluation of pain since his surgery.  On exam, patient appears nontoxic.  Vital signs are stable, doubt infection.  On exam, site does not appear infected.  Patient is not currently taking anything for pain, this is likely why he is hurting.  Did not like the side effect of constipation from narcotics, will treat with a Toradol shot and NSAIDs at home.  Will  have patient follow-up with his surgeon on an outpatient basis. Case discussed with attending, Dr. Estell Harpin evaluated the pt. At this time, pt appears safe for d/c. Return precautions given.   Final Clinical Impression(s) / ED Diagnoses Final diagnoses:  Post-operative pain    Rx / DC Orders ED Discharge Orders         Ordered    naproxen (NAPROSYN) 500 MG tablet  2 times daily with meals        07/16/20 1708           Jaekwon Mcclune, PA-C 07/16/20 1740    Bethann Berkshire, MD 07/18/20 1606

## 2020-09-10 DIAGNOSIS — Z96641 Presence of right artificial hip joint: Secondary | ICD-10-CM | POA: Insufficient documentation

## 2020-09-29 ENCOUNTER — Other Ambulatory Visit: Payer: Self-pay | Admitting: Orthopedic Surgery

## 2020-09-29 DIAGNOSIS — M48061 Spinal stenosis, lumbar region without neurogenic claudication: Secondary | ICD-10-CM

## 2020-12-27 ENCOUNTER — Emergency Department (HOSPITAL_COMMUNITY)
Admission: EM | Admit: 2020-12-27 | Discharge: 2020-12-27 | Disposition: A | Discharge status: Home / Self Care | Payer: No Typology Code available for payment source | Attending: Emergency Medicine | Admitting: Emergency Medicine

## 2020-12-27 ENCOUNTER — Other Ambulatory Visit: Payer: Self-pay

## 2020-12-27 ENCOUNTER — Encounter (HOSPITAL_COMMUNITY): Payer: Self-pay | Admitting: Emergency Medicine

## 2020-12-27 DIAGNOSIS — Z7982 Long term (current) use of aspirin: Secondary | ICD-10-CM | POA: Insufficient documentation

## 2020-12-27 DIAGNOSIS — M25512 Pain in left shoulder: Secondary | ICD-10-CM | POA: Diagnosis not present

## 2020-12-27 DIAGNOSIS — I10 Essential (primary) hypertension: Secondary | ICD-10-CM | POA: Insufficient documentation

## 2020-12-27 DIAGNOSIS — I2510 Atherosclerotic heart disease of native coronary artery without angina pectoris: Secondary | ICD-10-CM | POA: Insufficient documentation

## 2020-12-27 DIAGNOSIS — G8929 Other chronic pain: Secondary | ICD-10-CM

## 2020-12-27 DIAGNOSIS — Z79899 Other long term (current) drug therapy: Secondary | ICD-10-CM | POA: Diagnosis not present

## 2020-12-27 DIAGNOSIS — Z87891 Personal history of nicotine dependence: Secondary | ICD-10-CM | POA: Insufficient documentation

## 2020-12-27 DIAGNOSIS — Z96653 Presence of artificial knee joint, bilateral: Secondary | ICD-10-CM | POA: Insufficient documentation

## 2020-12-27 DIAGNOSIS — Z96641 Presence of right artificial hip joint: Secondary | ICD-10-CM | POA: Diagnosis not present

## 2020-12-27 MED ORDER — CYCLOBENZAPRINE HCL 5 MG PO TABS: 10.0000 mg | ORAL_TABLET | Freq: Two times a day (BID) | ORAL | 0 refills | Status: AC | PRN

## 2020-12-27 MED ORDER — PREDNISONE 10 MG PO TABS: 30.0000 mg | ORAL_TABLET | Freq: Every day | ORAL | 0 refills | Status: AC

## 2020-12-27 NOTE — ED Provider Notes (Signed)
[facility] EMERGENCY DEPARTMENT Provider Note   CSN: [id number] Arrival date & time: 12/27/20  1512     History Chief Complaint  Patient presents with   Shoulder Pain    Derrick Dean is a 80 y.o. male.  HPI  Patient with significant medical history of anemia, GERD, presents with chief complaint of left shoulder pain.  Patient states he has chronic shoulder pain, states the pain has gotten worse since he missed his last steroid injection.  He states over the last 7 years he get these  injections periodically.  He denies recent trauma to the area, denies paresthesia or weakness in that extremity, denies associated fevers, chills, chest pain, shortness of breath, denies history of IV drug use.  Patient not taking any pain medication for this, he denies any alleviating factors.  He does not endorse fevers, chills, chest pain, shortness breath, vomiting, worsening pedal edema.  Past Medical History:  Diagnosis Date   Anemia    Appendicitis    Arthritis    B12 nutritional deficiency    on supplement   GERD (gastroesophageal reflux disease)    Hepatitis C infection    not treated - pending appointment in april 2015   History of heart artery stent    Hypertension    Presbyopia    wears bifocals   Trouble in sleeping     Patient Active Problem List   Diagnosis Date Noted   Chronic midline low back pain without sciatica 05/24/2018   Lumbar spondylosis 05/24/2018   Pain syndrome, chronic 06/15/2017   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/15/2017   Sprain of right wrist 10/31/2016   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   S/P ORIF (open reduction internal fixation) fracture 05/05/2016   Closed fracture of right distal radius 04/18/2016   Common cold 09/25/2013   CAD S/P percutaneous coronary angioplasty 09/25/2013   HCV (hepatitis C virus) 05/15/2013   Cholelithiasis 05/15/2013   Abdominal pain, epigastric 05/15/2013   Pancreatitis, acute 05/13/2013   Unspecified  essential hypertension 05/13/2013   Transaminasemia 05/13/2013   Chest pain 05/12/2013    Past Surgical History:  Procedure Laterality Date   APPENDECTOMY     CARDIAC CATHETERIZATION     7 YRS AGO W/STENT   Left shoulder surgery     Right Hip Replacement Right 01/27/2020   Right Shoulder surgery     TOTAL KNEE ARTHROPLASTY Bilateral        Family History  Problem Relation Age of Onset   Coronary artery disease Mother    Thyroid disease Mother    Heart disease Mother    Coronary artery disease Father    Heart disease Father     Social History   Tobacco Use   Smoking status: Former    Types: Cigarettes    Quit date: 09/14/2001    Years since quitting: 19.2   Smokeless tobacco: Never  Vaping Use   Vaping Use: Never used  Substance Use Topics   Alcohol use: Yes    Alcohol/week: 1.0 standard drink    Types: 1 Cans of beer per week    Comment: Nightly   Drug use: No    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 Authorizing Provider  cyclobenzaprine (FLEXERIL) 5 MG tablet Take 2 tablets (10 mg total) by mouth 2 (two) times daily as needed for up to 10 days for muscle  spasms. 12/27/20 01/06/21 Yes Carroll Sage, PA-C  predniSONE (DELTASONE) 10 MG tablet Take 3 tablets (30 mg total) by mouth daily for 5 days. 12/27/20 01/01/21 Yes Carroll Sage, PA-C  allopurinol (ZYLOPRIM) 300 MG tablet Take 300 mg by mouth daily.    [provider]  amLODipine (NORVASC) 10 MG tablet Take 5 mg by mouth daily.    [provider]  aspirin EC 81 MG tablet Take 81 mg by mouth daily.    [provider]  capsicum (ZOSTRIX) 0.075 % topical cream Apply 1 application topically 2 (two) times daily as needed. For pain    [provider]  cetirizine (ZYRTEC) 10 MG tablet Take 1 tablet (10 mg total) by mouth daily. 09/26/13   Regalado, Belkys A, MD   cholecalciferol (VITAMIN D) 1000 UNITS tablet Take 1,000 Units by mouth daily.    [provider]  gabapentin (NEURONTIN) 400 MG capsule Take 400 mg by mouth 3 (three) times daily.    [provider]  HYDROcodone-acetaminophen (NORCO/VICODIN) 5-325 MG per tablet Take 1 tablet by mouth every 4 (four) hours as needed. 11/20/13   Rolland Porter, MD  methocarbamol (ROBAXIN) 500 MG tablet Take 1 tablet (500 mg total) by mouth 2 (two) times daily. 04/10/20   Terrilee Files, MD  mirtazapine (REMERON) 15 MG tablet Take 15 mg by mouth at bedtime.    [provider]  naproxen (NAPROSYN) 500 MG tablet Take 1 tablet (500 mg total) by mouth 2 (two) times daily with a meal. 07/16/20   Caccavale, Sophia, PA-C  omeprazole (PRILOSEC) 20 MG capsule Take 20 mg by mouth daily.    [provider]  omeprazole (PRILOSEC) 20 MG capsule Take 1 capsule (20 mg total) by mouth daily. 11/04/14   Palumbo, April, MD  polyethylene glycol powder (MIRALAX) powder Take 17 g by mouth daily. 11/04/14   Palumbo, April, MD  pravastatin (PRAVACHOL) 80 MG tablet Take 40 mg by mouth at bedtime.    [provider]  prazosin (MINIPRESS) 1 MG capsule Take 1 mg by mouth at bedtime.    [provider]  tamsulosin (FLOMAX) 0.4 MG CAPS capsule Take 1 capsule (0.4 mg total) by mouth daily. 11/20/13   Rolland Porter, MD  vitamin B-12 (CYANOCOBALAMIN) 1000 MCG tablet Take 1,000 mcg by mouth daily.    [provider]    Allergies    Lisinopril  Review of Systems   Review of Systems  Constitutional:  Negative for chills and fever.  HENT:  Negative for congestion.   Respiratory:  Negative for shortness of breath.   Cardiovascular:  Negative for chest pain.  Gastrointestinal:  Negative for abdominal pain.  Genitourinary:  Negative for enuresis.  Musculoskeletal:  Negative for back pain.       Left shoulder pain.  Skin:  Negative for rash.  Neurological:  Negative for dizziness.   Hematological:  Does not bruise/bleed easily.   Physical Exam Updated Vital Signs BP (!) 188/85 (BP Location: Right Arm)   Pulse (!) 59   Resp 15   Ht  (1.803 m)   Wt 75.3 kg   SpO2 100%   BMI 23.15 kg/m   Physical Exam Vitals and nursing note reviewed.  Constitutional:      General: He is not in acute distress.    Appearance: He is not ill-appearing.  HENT:     Head: Normocephalic and atraumatic.     Nose: No congestion.  Eyes:  Conjunctiva/sclera: Conjunctivae normal.  Cardiovascular:     Rate and Rhythm: Normal rate and regular rhythm.     Pulses: Normal pulses.     Heart sounds: No murmur heard.   No friction rub. No gallop.  Pulmonary:     Effort: No respiratory distress.     Breath sounds: No wheezing, rhonchi or rales.  Musculoskeletal:     Comments: Left shoulder is visualized no gross deformities present, no erythema or edema present, he has limited range of motion left shoulder due to pain, he has full range of motion in his fingers wrist and elbow.  5 out of 5 grip strength bilaterally neurovascular fully intact.  Shoulder was palpated he was slightly tender on the anterior aspect of his deltoid   Skin:    General: Skin is warm and dry.  Neurological:     Mental Status: He is alert.  Psychiatric:        Mood and Affect: Mood normal.    ED Results / Procedures / Treatments   Labs (all labs ordered are listed, but only abnormal results are displayed) Labs Reviewed - No data to display  EKG None  Radiology No results found.  Procedures Procedures   Medications Ordered in ED Medications - No data to display  ED Course  I have reviewed the triage vital signs and the nursing notes.  Pertinent labs & imaging results that were available during my care of the patient were reviewed by me and considered in my medical decision making (see chart for details).    MDM Rules/Calculators/A&P                          Initial impression-patient  presents with acute on chronic left shoulder pain.  He is alert, does not appear in acute stress, vital signs reassuring.  Work-up-due to well-appearing patient, benign physical exam, further lab or imaging are not warranted at this time.  Rule out- I have low suspicion for septic arthritis as patient denies IV drug use, skin exam was performed no erythematous, edematous, warm joints noted on exam, no new heart murmur heard on exam.  Low suspicion for fracture or dislocation is no gross deformities present, will defer imaging as there is no traumatic injury associated with his pain.  Low suspicion for ligament or tendon damage as area was palpated no gross defects noted.  He does have limited range of motion by suspect this is likely due to pain.  Low suspicion for compartment syndrome as area was palpated it was soft to the touch, neurovascular fully intact.  Low suspicion for atypical ACS as patient denies chest pain, shortness of breath, presentation more consistent musculoskeletal pain.   Plan-  Left shoulder pain-likely is acute on chronic pain, will start off with a short course of steroids, have him continue with ibuprofen Tylenol as needed.  Follow-up with orthopedic surgery for further evaluation.  Vital signs have remained stable, no indication for hospital admission.  Patient discussed with attending and they agreed with assessment and plan.  Patient given at home care as well strict return precautions.  Patient verbalized that they understood agreed to said plan.  Final Clinical Impression(s) / ED Diagnoses Final diagnoses:  Chronic left shoulder pain    Rx / DC Orders ED Discharge Orders          Ordered    predniSONE (DELTASONE) 10 MG tablet  Daily        12/27/20  2059    cyclobenzaprine (FLEXERIL) 5 MG tablet  2 times daily PRN        12/27/20 2059             Barnie Del 12/27/20 2101    Derwood Kaplan, MD 12/28/20 1135

## 2020-12-27 NOTE — ED Triage Notes (Signed)
Pt states he gets shots in his left shoulder and hasnt been able to get seen in over six months and is in pain. Pt states he wants to get a dr or orthopedic office closer to [place].

## 2020-12-27 NOTE — Discharge Instructions (Addendum)
You have been seen here for left shoulder pain, I recommend taking over-the-counter pain medications like ibuprofen and/or Tylenol every 6 as needed.  Please follow dosage and on the back of bottle.  I also recommend applying heat to the area and stretching out the muscles as this will help decrease stiffness and pain.  I have given you information on exercises please follow.  Starting on steroids please take as prescribed  I have also given you a prescription for a muscle relaxer this can make you drowsy do not consume alcohol or operate heavy machinery when taking this medication.   Please follow-up with orthopedic surgery for further evaluation.  Come back to the emergency department if you develop chest pain, shortness of breath, severe abdominal pain, uncontrolled nausea, vomiting, diarrhea.

## 2020-12-29 ENCOUNTER — Other Ambulatory Visit: Payer: Self-pay

## 2020-12-29 ENCOUNTER — Encounter (HOSPITAL_COMMUNITY): Payer: Self-pay | Admitting: Emergency Medicine

## 2020-12-29 ENCOUNTER — Emergency Department (HOSPITAL_COMMUNITY): Payer: No Typology Code available for payment source

## 2020-12-29 ENCOUNTER — Emergency Department (HOSPITAL_COMMUNITY)
Admission: EM | Admit: 2020-12-29 | Discharge: 2020-12-30 | Disposition: A | Discharge status: Home / Self Care | Payer: No Typology Code available for payment source | Attending: Emergency Medicine | Admitting: Emergency Medicine

## 2020-12-29 DIAGNOSIS — Z79899 Other long term (current) drug therapy: Secondary | ICD-10-CM | POA: Diagnosis not present

## 2020-12-29 DIAGNOSIS — S90851A Superficial foreign body, right foot, initial encounter: Secondary | ICD-10-CM | POA: Diagnosis not present

## 2020-12-29 DIAGNOSIS — Z96641 Presence of right artificial hip joint: Secondary | ICD-10-CM | POA: Insufficient documentation

## 2020-12-29 DIAGNOSIS — I10 Essential (primary) hypertension: Secondary | ICD-10-CM | POA: Insufficient documentation

## 2020-12-29 DIAGNOSIS — Z7982 Long term (current) use of aspirin: Secondary | ICD-10-CM | POA: Diagnosis not present

## 2020-12-29 DIAGNOSIS — Z96653 Presence of artificial knee joint, bilateral: Secondary | ICD-10-CM | POA: Insufficient documentation

## 2020-12-29 DIAGNOSIS — Z87891 Personal history of nicotine dependence: Secondary | ICD-10-CM | POA: Diagnosis not present

## 2020-12-29 DIAGNOSIS — I2510 Atherosclerotic heart disease of native coronary artery without angina pectoris: Secondary | ICD-10-CM | POA: Diagnosis not present

## 2020-12-29 DIAGNOSIS — W25XXXA Contact with sharp glass, initial encounter: Secondary | ICD-10-CM | POA: Insufficient documentation

## 2020-12-29 NOTE — ED Triage Notes (Signed)
Pt c/o having glass stuck in his right foot since Monday.

## 2020-12-30 ENCOUNTER — Telehealth: Payer: Self-pay | Admitting: Orthopedic Surgery

## 2020-12-30 MED ORDER — LIDOCAINE HCL (PF) 2 % IJ SOLN
INTRAMUSCULAR | Status: AC
  Filled 2020-12-30: qty 10

## 2020-12-30 NOTE — Telephone Encounter (Signed)
Would you please review this patient's ER notes.  Derrick Dean called and left a voicemail while we were at lunch.  I offered an appointment for Monday but he is requesting to come today because his foot hurts.  Please advise if we need to work in today or have him come in on Monday?  Thanks

## 2020-12-30 NOTE — Discharge Instructions (Addendum)
Weightbearing as tolerated.  Local wound care with bacitracin and dressing changes twice daily.  Follow-up with Dr. [name] in the orthopedic clinic in the next 2 days.  His contact information has been provided in this discharge summary for you to call tomorrow morning and make these arrangements.

## 2020-12-30 NOTE — ED Provider Notes (Signed)
[facility] EMERGENCY DEPARTMENT Provider Note   CSN: [id number] Arrival date & time: 12/29/20  2200     History Chief Complaint  Patient presents with   Foot Pain    Derrick Dean is a 80 y.o. male.  Patient is an 80 year old male with past medical history of GERD, hypertension.  Patient presenting today for evaluation of foreign body in his right foot.  Patient was cleaning up broken glass when he stepped on a small piece of this and it went into his right foot.  He describes pain with bearing weight.  The history is provided by the patient.      Past Medical History:  Diagnosis Date   Anemia    Appendicitis    Arthritis    B12 nutritional deficiency    on supplement   GERD (gastroesophageal reflux disease)    Hepatitis C infection    not treated - pending appointment in april 2015   History of heart artery stent    Hypertension    Presbyopia    wears bifocals   Trouble in sleeping     Patient Active Problem List   Diagnosis Date Noted   Chronic midline low back pain without sciatica 05/24/2018   Lumbar spondylosis 05/24/2018   Pain syndrome, chronic 06/15/2017   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/15/2017   Sprain of right wrist 10/31/2016   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   S/P ORIF (open reduction internal fixation) fracture 05/05/2016   Closed fracture of right distal radius 04/18/2016   Common cold 09/25/2013   CAD S/P percutaneous coronary angioplasty 09/25/2013   HCV (hepatitis C virus) 05/15/2013   Cholelithiasis 05/15/2013   Abdominal pain, epigastric 05/15/2013   Pancreatitis, acute 05/13/2013   Unspecified essential hypertension 05/13/2013   Transaminasemia 05/13/2013   Chest pain 05/12/2013    Past Surgical History:  Procedure Laterality Date   APPENDECTOMY     CARDIAC CATHETERIZATION     7 YRS AGO W/STENT   Left shoulder surgery     Right Hip Replacement Right 01/27/2020   Right Shoulder surgery     TOTAL KNEE ARTHROPLASTY  Bilateral        Family History  Problem Relation Age of Onset   Coronary artery disease Mother    Thyroid disease Mother    Heart disease Mother    Coronary artery disease Father    Heart disease Father     Social History   Tobacco Use   Smoking status: Former    Types: Cigarettes    Quit date: 09/14/2001    Years since quitting: 19.3   Smokeless tobacco: Never  Vaping Use   Vaping Use: Never used  Substance Use Topics   Alcohol use: Yes    Alcohol/week: 1.0 standard drink    Types: 1 Cans of beer per week    Comment: Nightly   Drug use: No    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 Authorizing Provider  allopurinol (ZYLOPRIM) 300 MG tablet Take 300 mg by mouth daily.    [provider]  amLODipine (NORVASC) 10 MG tablet Take 5 mg by mouth daily.    [provider]  aspirin EC 81 MG tablet Take 81 mg by mouth daily.    [provider]  capsicum (ZOSTRIX) 0.075 % topical cream Apply 1 application topically 2 (two) times daily as needed. For  pain    [provider]  cetirizine (ZYRTEC) 10 MG tablet Take 1 tablet (10 mg total) by mouth daily. 09/26/13   Regalado, Belkys A, MD  cholecalciferol (VITAMIN D) 1000 UNITS tablet Take 1,000 Units by mouth daily.    [provider]  cyclobenzaprine (FLEXERIL) 5 MG tablet Take 2 tablets (10 mg total) by mouth 2 (two) times daily as needed for up to 10 days for muscle spasms. 12/27/20 01/06/21  Carroll Sage, PA-C  gabapentin (NEURONTIN) 400 MG capsule Take 400 mg by mouth 3 (three) times daily.    [provider]  HYDROcodone-acetaminophen (NORCO/VICODIN) 5-325 MG per tablet Take 1 tablet by mouth every 4 (four) hours as needed. 11/20/13   Rolland Porter, MD  methocarbamol (ROBAXIN) 500 MG tablet Take 1 tablet (500 mg total) by mouth 2 (two) times daily. 04/10/20   Terrilee Files, MD  mirtazapine (REMERON) 15 MG tablet Take 15 mg by mouth at bedtime.    [provider]  naproxen (NAPROSYN) 500 MG tablet Take 1 tablet (500 mg total) by mouth 2 (two) times daily with a meal. 07/16/20   Caccavale, Sophia, PA-C  omeprazole (PRILOSEC) 20 MG capsule Take 20 mg by mouth daily.    [provider]  omeprazole (PRILOSEC) 20 MG capsule Take 1 capsule (20 mg total) by mouth daily. 11/04/14   Palumbo, April, MD  polyethylene glycol powder (MIRALAX) powder Take 17 g by mouth daily. 11/04/14   Palumbo, April, MD  pravastatin (PRAVACHOL) 80 MG tablet Take 40 mg by mouth at bedtime.    [provider]  prazosin (MINIPRESS) 1 MG capsule Take 1 mg by mouth at bedtime.    [provider]  predniSONE (DELTASONE) 10 MG tablet Take 3 tablets (30 mg total) by mouth daily for 5 days. 12/27/20 01/01/21  Carroll Sage, PA-C  tamsulosin (FLOMAX) 0.4 MG CAPS capsule Take 1 capsule (0.4 mg total) by mouth daily. 11/20/13   Rolland Porter, MD  vitamin B-12 (CYANOCOBALAMIN) 1000 MCG tablet Take 1,000 mcg by mouth daily.    [provider]    Allergies    Lisinopril  Review of Systems   Review of Systems  All other systems reviewed and are negative.  Physical Exam Updated Vital Signs BP (!) 166/90 (BP Location: Right Arm)   Pulse 89   Temp (!) 97.4 F (36.3 C) (Oral)   Resp 20   Ht 5\' 11"  (1.803 m)   Wt 75.3 kg   SpO2 99%   BMI 23.15 kg/m   Physical Exam Vitals and nursing note reviewed.  Constitutional:      General: He is not in acute distress.    Appearance: Normal appearance. He is not ill-appearing.  HENT:     Head: Normocephalic and atraumatic.  Pulmonary:     Effort: Pulmonary effort is normal.  Musculoskeletal:     Comments: The bottom of the right foot has a small puncture at the level of the 3rd-4th distal metatarsal.  There is no surrounding erythema or redness.  Skin:    General: Skin is warm and dry.  Neurological:      Mental Status: He is alert.    ED Results / Procedures / Treatments   Labs (all labs ordered are listed, but only abnormal results are displayed) Labs Reviewed - No data to display  EKG None  Radiology DG Foot Complete Right  Result Date: 12/29/2020 CLINICAL DATA:  Glass foreign body in the ball of  the foot. EXAM: RIGHT FOOT COMPLETE - 3+ VIEW COMPARISON:  None. FINDINGS: On the lateral view only there is a 5 mm radiopaque foreign body projecting over the plantar soft tissues at the metatarsophalangeal region worrisome for foreign body. There is no evidence for acute fracture or dislocation. There are mild degenerative changes of interphalangeal joints in first metatarsophalangeal joint. There is a posterior calcaneal spur. IMPRESSION: 1. 5 mm foreign body in the soft tissues at the plantar metatarsophalangeal region. 2. No acute bony abnormality. Electronically Signed   By: Darliss Cheney M.D.   On: 12/29/2020 22:42    Procedures Procedures   Medications Ordered in ED Medications  lidocaine HCl (PF) (XYLOCAINE) 2 % injection (has no administration in time range)    ED Course  I have reviewed the triage vital signs and the nursing notes.  Pertinent labs & imaging results that were available during my care of the patient were reviewed by me and considered in my medical decision making (see chart for details).    MDM Rules/Calculators/A&P  Patient presenting with foreign body in his right foot, presumably broken glass.  There was a small foreign body that showed up on x-ray, but was unable to be retrieved as per note below.  Patient to be discharged with follow-up with orthopedics.  He will be referred to Dr. [name].  After cleaning the skin with Betadine, it was numbed with 2% lidocaine.  A small incision was made using an 11 blade, however I was unable to retrieve the foreign body.  Final Clinical Impression(s) / ED Diagnoses Final diagnoses:  None    Rx / DC  Orders ED Discharge Orders     None        Geoffery Lyons, MD 12/30/20 [phone]

## 2020-12-30 NOTE — Telephone Encounter (Signed)
Thanks

## 2020-12-30 NOTE — Telephone Encounter (Signed)
I spoke back to Mr. Golphin and told him again that we could see him Monday.  He said he would just find somebody that could see him today.  I told him that if he changed his mind, we would hold that appointment for him to see Dr. [name] on Monday

## 2021-01-03 ENCOUNTER — Ambulatory Visit: Payer: No Typology Code available for payment source | Admitting: Orthopedic Surgery

## 2021-01-03 DIAGNOSIS — M25519 Pain in unspecified shoulder: Secondary | ICD-10-CM | POA: Insufficient documentation

## 2021-01-03 DIAGNOSIS — E538 Deficiency of other specified B group vitamins: Secondary | ICD-10-CM | POA: Insufficient documentation

## 2021-01-03 DIAGNOSIS — M109 Gout, unspecified: Secondary | ICD-10-CM | POA: Insufficient documentation

## 2021-01-03 DIAGNOSIS — E785 Hyperlipidemia, unspecified: Secondary | ICD-10-CM | POA: Insufficient documentation

## 2021-01-03 DIAGNOSIS — R2689 Other abnormalities of gait and mobility: Secondary | ICD-10-CM | POA: Insufficient documentation

## 2021-01-03 DIAGNOSIS — F339 Major depressive disorder, recurrent, unspecified: Secondary | ICD-10-CM | POA: Insufficient documentation

## 2021-01-03 DIAGNOSIS — K219 Gastro-esophageal reflux disease without esophagitis: Secondary | ICD-10-CM | POA: Insufficient documentation

## 2021-01-03 DIAGNOSIS — F4310 Post-traumatic stress disorder, unspecified: Secondary | ICD-10-CM | POA: Insufficient documentation

## 2021-01-03 DIAGNOSIS — I2510 Atherosclerotic heart disease of native coronary artery without angina pectoris: Secondary | ICD-10-CM | POA: Insufficient documentation

## 2021-01-14 ENCOUNTER — Other Ambulatory Visit (HOSPITAL_COMMUNITY): Payer: Self-pay | Admitting: Orthopedic Surgery

## 2021-01-14 ENCOUNTER — Other Ambulatory Visit: Payer: Self-pay | Admitting: Orthopedic Surgery

## 2021-01-14 DIAGNOSIS — M25512 Pain in left shoulder: Secondary | ICD-10-CM

## 2021-01-28 ENCOUNTER — Other Ambulatory Visit: Payer: Self-pay

## 2021-01-28 ENCOUNTER — Emergency Department (HOSPITAL_COMMUNITY): Payer: No Typology Code available for payment source

## 2021-01-28 ENCOUNTER — Emergency Department (HOSPITAL_COMMUNITY)
Admission: EM | Admit: 2021-01-28 | Discharge: 2021-01-29 | Disposition: A | Discharge status: Home / Self Care | Payer: No Typology Code available for payment source | Attending: Emergency Medicine | Admitting: Emergency Medicine

## 2021-01-28 DIAGNOSIS — Z20822 Contact with and (suspected) exposure to covid-19: Secondary | ICD-10-CM | POA: Diagnosis not present

## 2021-01-28 DIAGNOSIS — Z79899 Other long term (current) drug therapy: Secondary | ICD-10-CM | POA: Insufficient documentation

## 2021-01-28 DIAGNOSIS — R0989 Other specified symptoms and signs involving the circulatory and respiratory systems: Secondary | ICD-10-CM | POA: Insufficient documentation

## 2021-01-28 DIAGNOSIS — R059 Cough, unspecified: Secondary | ICD-10-CM | POA: Diagnosis not present

## 2021-01-28 DIAGNOSIS — Z96641 Presence of right artificial hip joint: Secondary | ICD-10-CM | POA: Diagnosis not present

## 2021-01-28 DIAGNOSIS — I10 Essential (primary) hypertension: Secondary | ICD-10-CM | POA: Insufficient documentation

## 2021-01-28 DIAGNOSIS — Z87891 Personal history of nicotine dependence: Secondary | ICD-10-CM | POA: Insufficient documentation

## 2021-01-28 DIAGNOSIS — Z96653 Presence of artificial knee joint, bilateral: Secondary | ICD-10-CM | POA: Insufficient documentation

## 2021-01-28 DIAGNOSIS — Z7982 Long term (current) use of aspirin: Secondary | ICD-10-CM | POA: Insufficient documentation

## 2021-01-28 DIAGNOSIS — R0981 Nasal congestion: Secondary | ICD-10-CM | POA: Insufficient documentation

## 2021-01-28 DIAGNOSIS — I2510 Atherosclerotic heart disease of native coronary artery without angina pectoris: Secondary | ICD-10-CM | POA: Insufficient documentation

## 2021-01-28 DIAGNOSIS — J209 Acute bronchitis, unspecified: Secondary | ICD-10-CM

## 2021-01-28 DIAGNOSIS — Z955 Presence of coronary angioplasty implant and graft: Secondary | ICD-10-CM | POA: Diagnosis not present

## 2021-01-28 LAB — RESP PANEL BY RT-PCR (FLU A&B, COVID) ARPGX2
Influenza A by PCR: NEGATIVE
Influenza B by PCR: NEGATIVE
SARS Coronavirus 2 by RT PCR: NEGATIVE

## 2021-01-28 MED ORDER — ACETAMINOPHEN 325 MG PO TABS: 650.0000 mg | ORAL_TABLET | Freq: Once | ORAL | Status: DC

## 2021-01-28 NOTE — ED Provider Notes (Signed)
[facility] EMERGENCY DEPARTMENT Provider Note   CSN: [id number] Arrival date & time: 01/28/21  2227     History Chief Complaint  Patient presents with   Cough    Braedyn A Liptak is a 80 y.o. male.  Patient is an 80 year old male with past medical history of coronary artery disease with stent, GERD, hypertension.  Patient presenting today with nasal congestion, chest congestion, and cough.  This has been worsening over the past several days.  He denies any chest pain or difficulty breathing.  He denies having checked his temperature at home.  He reports being at a meeting earlier in the week around other people not wearing masks and symptoms started shortly afterward.  The history is provided by the patient.  Cough Cough characteristics:  Productive Sputum characteristics:  Yellow Severity:  Moderate Onset quality:  Gradual Duration:  4 days Timing:  Constant Progression:  Worsening Chronicity:  New Relieved by:  Nothing Worsened by:  Nothing     Past Medical History:  Diagnosis Date   Anemia    Appendicitis    Arthritis    B12 nutritional deficiency    on supplement   GERD (gastroesophageal reflux disease)    Hepatitis C infection    not treated - pending appointment in april 2015   History of heart artery stent    Hypertension    Presbyopia    wears bifocals   Trouble in sleeping     Patient Active Problem List   Diagnosis Date Noted   Other abnormalities of gait and mobility 01/03/2021   Gout 01/03/2021   Posttraumatic stress disorder 01/03/2021   Gastroesophageal reflux disease 01/03/2021   Hyperlipidemia 01/03/2021   Vitamin B12 deficiency 01/03/2021   Shoulder pain 01/03/2021   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   Coronary atherosclerosis 01/03/2021   Chronic midline low back pain without sciatica 05/24/2018   Lumbar spondylosis 05/24/2018   Pain syndrome, chronic 06/15/2017   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/15/2017   Sprain  of right wrist 10/31/2016   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   S/P ORIF (open reduction internal fixation) fracture 05/05/2016   Closed fracture of right distal radius 04/18/2016   Common cold 09/25/2013   CAD S/P percutaneous coronary angioplasty 09/25/2013   HCV (hepatitis C virus) 05/15/2013   Cholelithiasis 05/15/2013   Abdominal pain, epigastric 05/15/2013   Pancreatitis, acute 05/13/2013   Unspecified essential hypertension 05/13/2013   Transaminasemia 05/13/2013   Chest pain 05/12/2013    Past Surgical History:  Procedure Laterality Date   APPENDECTOMY     CARDIAC CATHETERIZATION     7 YRS AGO W/STENT   Left shoulder surgery     Right Hip Replacement Right 01/27/2020   Right Shoulder surgery     TOTAL KNEE ARTHROPLASTY Bilateral        Family History  Problem Relation Age of Onset   Coronary artery disease Mother    Thyroid disease Mother    Heart disease Mother    Coronary artery disease Father    Heart disease Father     Social History   Tobacco Use   Smoking status: Former    Types: Cigarettes    Quit date: 09/14/2001    Years since quitting: 19.3   Smokeless tobacco: Never  Vaping Use   Vaping Use: Never used  Substance Use Topics   Alcohol use: Yes    Alcohol/week: 1.0 standard drink    Types: 1 Cans of beer per week  Comment: Nightly   Drug use: No    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 Authorizing Provider  allopurinol (ZYLOPRIM) 300 MG tablet Take 300 mg by mouth daily.    [provider]  amLODipine (NORVASC) 10 MG tablet Take 5 mg by mouth daily.    [provider]  aspirin EC 81 MG tablet Take 81 mg by mouth daily.    [provider]  capsicum (ZOSTRIX) 0.075 % topical cream Apply 1 application topically 2 (two) times daily as needed. For pain    [provider]  cetirizine (ZYRTEC) 10  MG tablet Take 1 tablet (10 mg total) by mouth daily. 09/26/13   Regalado, Belkys A, MD  cholecalciferol (VITAMIN D) 1000 UNITS tablet Take 1,000 Units by mouth daily.    [provider]  gabapentin (NEURONTIN) 400 MG capsule Take 400 mg by mouth 3 (three) times daily.    [provider]  HYDROcodone-acetaminophen (NORCO/VICODIN) 5-325 MG per tablet Take 1 tablet by mouth every 4 (four) hours as needed. 11/20/13   Rolland Porter, MD  methocarbamol (ROBAXIN) 500 MG tablet Take 1 tablet (500 mg total) by mouth 2 (two) times daily. 04/10/20   Terrilee Files, MD  mirtazapine (REMERON) 15 MG tablet Take 15 mg by mouth at bedtime.    [provider]  naproxen (NAPROSYN) 500 MG tablet Take 1 tablet (500 mg total) by mouth 2 (two) times daily with a meal. 07/16/20   Caccavale, Sophia, PA-C  omeprazole (PRILOSEC) 20 MG capsule Take 20 mg by mouth daily.    [provider]  omeprazole (PRILOSEC) 20 MG capsule Take 1 capsule (20 mg total) by mouth daily. 11/04/14   Palumbo, April, MD  polyethylene glycol powder (MIRALAX) powder Take 17 g by mouth daily. 11/04/14   Palumbo, April, MD  pravastatin (PRAVACHOL) 80 MG tablet Take 40 mg by mouth at bedtime.    [provider]  prazosin (MINIPRESS) 1 MG capsule Take 1 mg by mouth at bedtime.    [provider]  tamsulosin (FLOMAX) 0.4 MG CAPS capsule Take 1 capsule (0.4 mg total) by mouth daily. 11/20/13   Rolland Porter, MD  vitamin B-12 (CYANOCOBALAMIN) 1000 MCG tablet Take 1,000 mcg by mouth daily.    [provider]    Allergies    Lisinopril  Review of Systems   Review of Systems  Respiratory:  Positive for cough.   All other systems reviewed and are negative.  Physical Exam Updated Vital Signs BP (!) 203/95   Pulse 80   Temp 98.2 F (36.8 C)   Resp 17   Ht 5\' 11"  (1.803 m)   Wt 75.3 kg   SpO2 99%   BMI 23.15 kg/m   Physical Exam Vitals and nursing note reviewed.  Constitutional:       General: He is not in acute distress.    Appearance: He is well-developed. He is not diaphoretic.  HENT:     Head: Normocephalic and atraumatic.     Mouth/Throat:     Mouth: Mucous membranes are moist.     Pharynx: No oropharyngeal exudate or posterior oropharyngeal erythema.  Cardiovascular:     Rate and Rhythm: Normal rate and regular rhythm.     Heart sounds: No murmur heard.   No friction rub.  Pulmonary:     Effort: Pulmonary effort is normal. No  respiratory distress.     Breath sounds: Normal breath sounds. No wheezing or rales.  Abdominal:     General: Bowel sounds are normal. There is no distension.     Palpations: Abdomen is soft.     Tenderness: There is no abdominal tenderness.  Musculoskeletal:        General: Normal range of motion.     Cervical back: Normal range of motion and neck supple.  Skin:    General: Skin is warm and dry.  Neurological:     Mental Status: He is alert and oriented to person, place, and time.     Coordination: Coordination normal.    ED Results / Procedures / Treatments   Labs (all labs ordered are listed, but only abnormal results are displayed) Labs Reviewed  RESP PANEL BY RT-PCR (FLU A&B, COVID) ARPGX2    EKG None  Radiology No results found.  Procedures Procedures   Medications Ordered in ED Medications  acetaminophen (TYLENOL) tablet 650 mg (0 mg Oral Hold 01/28/21 2254)    ED Course  I have reviewed the triage vital signs and the nursing notes.  Pertinent labs & imaging results that were available during my care of the patient were reviewed by me and considered in my medical decision making (see chart for details).    MDM Rules/Calculators/A&P  Chest x-ray and COVID test are both negative.  I suspect bronchitis and will treat with Zithromax and over-the-counter medications.  Patient to return as needed if he worsens.  Final Clinical Impression(s) / ED Diagnoses Final diagnoses:  Cough    Rx / DC Orders ED  Discharge Orders     None        Geoffery Lyons, MD 01/29/21 0003

## 2021-01-28 NOTE — ED Triage Notes (Addendum)
Cough congestion, possibly fever, decreased appetite for 2 days Alert oriented ambulatory. POV from home.   Has not been taking meds like he is supposed to 201/94

## 2021-01-29 MED ORDER — AZITHROMYCIN 250 MG PO TABS: 250.0000 mg | ORAL_TABLET | Freq: Every day | ORAL | 0 refills | Status: DC

## 2021-01-29 MED ORDER — AZITHROMYCIN 250 MG PO TABS
500.0000 mg | ORAL_TABLET | Freq: Once | ORAL | Status: AC
  Administered 2021-01-29: 500 mg via ORAL
  Filled 2021-01-29: qty 2

## 2021-01-29 NOTE — Discharge Instructions (Addendum)
Begin taking Zithromax as prescribed.  Take over-the-counter medications as needed for relief of symptoms.  Return to the emergency department if you develop chest pain, difficulty breathing, or other new and concerning symptoms.

## 2021-02-04 ENCOUNTER — Emergency Department (HOSPITAL_COMMUNITY)
Admission: EM | Admit: 2021-02-04 | Discharge: 2021-02-05 | Disposition: A | Discharge status: Home / Self Care | Payer: No Typology Code available for payment source | Attending: Emergency Medicine | Admitting: Emergency Medicine

## 2021-02-04 ENCOUNTER — Other Ambulatory Visit: Payer: Self-pay

## 2021-02-04 DIAGNOSIS — Z87891 Personal history of nicotine dependence: Secondary | ICD-10-CM | POA: Insufficient documentation

## 2021-02-04 DIAGNOSIS — I2510 Atherosclerotic heart disease of native coronary artery without angina pectoris: Secondary | ICD-10-CM | POA: Insufficient documentation

## 2021-02-04 DIAGNOSIS — Z96653 Presence of artificial knee joint, bilateral: Secondary | ICD-10-CM | POA: Insufficient documentation

## 2021-02-04 DIAGNOSIS — Z7982 Long term (current) use of aspirin: Secondary | ICD-10-CM | POA: Diagnosis not present

## 2021-02-04 DIAGNOSIS — I10 Essential (primary) hypertension: Secondary | ICD-10-CM | POA: Diagnosis not present

## 2021-02-04 DIAGNOSIS — M79671 Pain in right foot: Secondary | ICD-10-CM | POA: Diagnosis present

## 2021-02-04 DIAGNOSIS — Z79899 Other long term (current) drug therapy: Secondary | ICD-10-CM | POA: Diagnosis not present

## 2021-02-05 ENCOUNTER — Emergency Department (HOSPITAL_COMMUNITY): Payer: No Typology Code available for payment source

## 2021-02-05 ENCOUNTER — Encounter (HOSPITAL_COMMUNITY): Payer: Self-pay | Admitting: Emergency Medicine

## 2021-02-05 NOTE — ED Provider Notes (Signed)
[facility] EMERGENCY DEPARTMENT Provider Note   CSN: [id number] Arrival date & time: 02/04/21  2343     History Chief Complaint  Patient presents with   Foot Pain    Derrick Dean is a 80 y.o. male.  The history is provided by the patient.  Foot Pain This is a recurrent problem. The current episode started more than 1 week ago. The problem occurs daily. The problem has been gradually worsening. The symptoms are aggravated by walking. The symptoms are relieved by rest.  Patient reports he sustained a foreign body to his right foot back in September.  He reports that it has been successfully removed, but reports about 3 weeks ago the pain returned.  It hurts every time he walks.  No fevers or chills.  No other acute complaints.    Past Medical History:  Diagnosis Date   Anemia    Appendicitis    Arthritis    B12 nutritional deficiency    on supplement   GERD (gastroesophageal reflux disease)    Hepatitis C infection    not treated - pending appointment in april 2015   History of heart artery stent    Hypertension    Presbyopia    wears bifocals   Trouble in sleeping     Patient Active Problem List   Diagnosis Date Noted   Other abnormalities of gait and mobility 01/03/2021   Gout 01/03/2021   Posttraumatic stress disorder 01/03/2021   Gastroesophageal reflux disease 01/03/2021   Hyperlipidemia 01/03/2021   Vitamin B12 deficiency 01/03/2021   Shoulder pain 01/03/2021   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   Coronary atherosclerosis 01/03/2021   Chronic midline low back pain without sciatica 05/24/2018   Lumbar spondylosis 05/24/2018   Pain syndrome, chronic 06/15/2017   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/15/2017   Sprain of right wrist 10/31/2016   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   S/P ORIF (open reduction internal fixation) fracture 05/05/2016   Closed fracture of right distal radius 04/18/2016   Common cold 09/25/2013   CAD S/P  percutaneous coronary angioplasty 09/25/2013   HCV (hepatitis C virus) 05/15/2013   Cholelithiasis 05/15/2013   Abdominal pain, epigastric 05/15/2013   Pancreatitis, acute 05/13/2013   Unspecified essential hypertension 05/13/2013   Transaminasemia 05/13/2013   Chest pain 05/12/2013    Past Surgical History:  Procedure Laterality Date   APPENDECTOMY     CARDIAC CATHETERIZATION     7 YRS AGO W/STENT   Left shoulder surgery     Right Hip Replacement Right 01/27/2020   Right Shoulder surgery     TOTAL KNEE ARTHROPLASTY Bilateral        Family History  Problem Relation Age of Onset   Coronary artery disease Mother    Thyroid disease Mother    Heart disease Mother    Coronary artery disease Father    Heart disease Father     Social History   Tobacco Use   Smoking status: Former    Types: Cigarettes    Quit date: 09/14/2001    Years since quitting: 19.4   Smokeless tobacco: Never  Vaping Use   Vaping Use: Never used  Substance Use Topics   Alcohol use: Yes    Alcohol/week: 1.0 standard drink    Types: 1 Cans of beer per week    Comment: Nightly   Drug use: No    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 Authorizing Provider  allopurinol (ZYLOPRIM) 300 MG tablet Take 300 mg by mouth daily.    [provider]  amLODipine (NORVASC) 10 MG tablet Take 5 mg by mouth daily.    [provider]  aspirin EC 81 MG tablet Take 81 mg by mouth daily.    [provider]  azithromycin (ZITHROMAX) 250 MG tablet Take 1 tablet (250 mg total) by mouth daily. 01/29/21   Geoffery Lyons, MD  capsicum (ZOSTRIX) 0.075 % topical cream Apply 1 application topically 2 (two) times daily as needed. For pain    [provider]  cetirizine (ZYRTEC) 10 MG tablet Take 1 tablet (10 mg total) by mouth daily. 09/26/13   Regalado, Belkys A, MD  cholecalciferol  (VITAMIN D) 1000 UNITS tablet Take 1,000 Units by mouth daily.    [provider]  gabapentin (NEURONTIN) 400 MG capsule Take 400 mg by mouth 3 (three) times daily.    [provider]  HYDROcodone-acetaminophen (NORCO/VICODIN) 5-325 MG per tablet Take 1 tablet by mouth every 4 (four) hours as needed. 11/20/13   Rolland Porter, MD  methocarbamol (ROBAXIN) 500 MG tablet Take 1 tablet (500 mg total) by mouth 2 (two) times daily. 04/10/20   Terrilee Files, MD  mirtazapine (REMERON) 15 MG tablet Take 15 mg by mouth at bedtime.    [provider]  naproxen (NAPROSYN) 500 MG tablet Take 1 tablet (500 mg total) by mouth 2 (two) times daily with a meal. 07/16/20   Caccavale, Sophia, PA-C  omeprazole (PRILOSEC) 20 MG capsule Take 20 mg by mouth daily.    [provider]  omeprazole (PRILOSEC) 20 MG capsule Take 1 capsule (20 mg total) by mouth daily. 11/04/14   Palumbo, April, MD  polyethylene glycol powder (MIRALAX) powder Take 17 g by mouth daily. 11/04/14   Palumbo, April, MD  pravastatin (PRAVACHOL) 80 MG tablet Take 40 mg by mouth at bedtime.    [provider]  prazosin (MINIPRESS) 1 MG capsule Take 1 mg by mouth at bedtime.    [provider]  tamsulosin (FLOMAX) 0.4 MG CAPS capsule Take 1 capsule (0.4 mg total) by mouth daily. 11/20/13   Rolland Porter, MD  vitamin B-12 (CYANOCOBALAMIN) 1000 MCG tablet Take 1,000 mcg by mouth daily.    [provider]    Allergies    Lisinopril  Review of Systems   Review of Systems  Constitutional:  Negative for chills and fever.  Musculoskeletal:  Positive for arthralgias.   Physical Exam Updated Vital Signs BP (!) 161/76 (BP Location: Right Arm)   Pulse 84   Temp 98.4 F (36.9 C) (Oral)   Resp 18   Ht 1.803 m (5\' 11" )   Wt 75 kg   SpO2 98%   BMI 23.06 kg/m   Physical Exam CONSTITUTIONAL: Well developed/well nourished, no distress HEAD: Normocephalic/atraumatic ENMT: Mucous membranes  moist NECK: supple no meningeal signs LUNGS:  no apparent distress NEURO: Pt is awake/alert/appropriate, moves all extremitiesx4.  No facial droop.   EXTREMITIES: pulses normal/equal, full ROM Calluses noted to the plantar surface of the right foot.  No erythema.  No crepitus.  No edema.  Distal pulses intact.  See photo below SKIN: warm, color normal, see photo below PSYCH: no abnormalities of mood noted, alert and oriented to situation    Patient gave verbal permission to utilize photo for medical documentation only The image was not stored on any personal device ED Results /  Procedures / Treatments   Labs (all labs ordered are listed, but only abnormal results are displayed) Labs Reviewed - No data to display  EKG None  Radiology DG Foot Complete Right  Result Date: 02/05/2021 CLINICAL DATA:  Patient stepped on a piece of glass. EXAM: RIGHT FOOT COMPLETE - 3+ VIEW COMPARISON:  December 30, 2020 FINDINGS: There is no evidence of fracture or dislocation. Mild degenerative changes are seen along the dorsal aspect of the proximal to mid right foot. Mild soft tissue swelling is seen along the plantar aspect of the distal right foot. No radiopaque soft tissue foreign bodies are identified. IMPRESSION: No radiopaque soft tissue foreign bodies. Electronically Signed   By: Aram Candela M.D.   On: 02/05/2021 00:38    Procedures Procedures   Medications Ordered in ED Medications - No data to display  ED Course  I have reviewed the triage vital signs and the nursing notes.  Pertinent imaging results that were available during my care of the patient were reviewed by me and considered in my medical decision making (see chart for details).    MDM Rules/Calculators/A&P                           Patient presents with right foot pain.  Reports the pain is very similar to when he had a foreign body in it but it had already been removed. I have reviewed the x-ray is negative.  He  does have callus formation on the foot.  I advised that he may still have small pieces of foreign body that are unable to be detected on x-ray.  He may need to undergo MRI or ultrasound imaging.  He will be referred to orthopedic   next week Final Clinical Impression(s) / ED Diagnoses Final diagnoses:  Foot pain, right    Rx / DC Orders ED Discharge Orders     None        Zadie Rhine, MD 02/05/21 0126

## 2021-02-05 NOTE — ED Triage Notes (Signed)
Pt with c/o foot pain to bottom of R foot. Pt states he stepped on a piece of glass awhile back and is still having pain to his foot. Pt concerned that there still might be a piece of glass in it.

## 2021-02-09 ENCOUNTER — Other Ambulatory Visit (HOSPITAL_COMMUNITY): Payer: Self-pay | Admitting: Orthopedic Surgery

## 2021-02-09 ENCOUNTER — Ambulatory Visit
Admission: RE | Admit: 2021-02-09 | Discharge: 2021-02-09 | Disposition: A | Discharge status: Home / Self Care | Payer: Self-pay | Source: Ambulatory Visit | Attending: Orthopedic Surgery | Admitting: Orthopedic Surgery

## 2021-02-09 DIAGNOSIS — M25519 Pain in unspecified shoulder: Secondary | ICD-10-CM

## 2021-03-23 ENCOUNTER — Other Ambulatory Visit (HOSPITAL_COMMUNITY): Payer: Self-pay | Admitting: General Practice

## 2021-03-23 ENCOUNTER — Ambulatory Visit (HOSPITAL_COMMUNITY)
Admission: RE | Admit: 2021-03-23 | Discharge: 2021-03-23 | Disposition: A | Discharge status: Home / Self Care | Payer: No Typology Code available for payment source | Source: Ambulatory Visit | Attending: Adult Health | Admitting: Adult Health

## 2021-03-23 ENCOUNTER — Other Ambulatory Visit: Payer: Self-pay

## 2021-03-23 DIAGNOSIS — M25561 Pain in right knee: Secondary | ICD-10-CM

## 2021-05-30 ENCOUNTER — Encounter: Payer: Self-pay | Admitting: Orthopedic Surgery

## 2021-06-23 ENCOUNTER — Other Ambulatory Visit: Payer: Self-pay

## 2021-06-23 ENCOUNTER — Emergency Department (HOSPITAL_COMMUNITY): Payer: No Typology Code available for payment source

## 2021-06-23 ENCOUNTER — Encounter (HOSPITAL_COMMUNITY): Payer: Self-pay

## 2021-06-23 DIAGNOSIS — M25561 Pain in right knee: Secondary | ICD-10-CM | POA: Diagnosis present

## 2021-06-23 DIAGNOSIS — Z96651 Presence of right artificial knee joint: Secondary | ICD-10-CM | POA: Insufficient documentation

## 2021-06-23 DIAGNOSIS — Z5321 Procedure and treatment not carried out due to patient leaving prior to being seen by health care provider: Secondary | ICD-10-CM | POA: Insufficient documentation

## 2021-06-23 NOTE — ED Triage Notes (Signed)
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 Pt ambulatory with cane in triage ?

## 2021-06-24 ENCOUNTER — Emergency Department (HOSPITAL_COMMUNITY)
Admission: EM | Admit: 2021-06-24 | Discharge: 2021-06-24 | Discharge status: Against Medical Advice | Payer: No Typology Code available for payment source | Attending: Emergency Medicine | Admitting: Emergency Medicine

## 2021-06-24 ENCOUNTER — Encounter (HOSPITAL_COMMUNITY): Payer: Self-pay | Admitting: *Deleted

## 2021-06-24 ENCOUNTER — Emergency Department (HOSPITAL_COMMUNITY)
Admission: EM | Admit: 2021-06-24 | Discharge: 2021-06-24 | Disposition: A | Discharge status: Home / Self Care | Payer: No Typology Code available for payment source | Attending: Student | Admitting: Student

## 2021-06-24 DIAGNOSIS — M25461 Effusion, right knee: Secondary | ICD-10-CM | POA: Diagnosis not present

## 2021-06-24 DIAGNOSIS — Z96651 Presence of right artificial knee joint: Secondary | ICD-10-CM | POA: Insufficient documentation

## 2021-06-24 DIAGNOSIS — M25561 Pain in right knee: Secondary | ICD-10-CM | POA: Diagnosis present

## 2021-06-24 DIAGNOSIS — Z7982 Long term (current) use of aspirin: Secondary | ICD-10-CM | POA: Diagnosis not present

## 2021-06-24 MED ORDER — KETOROLAC TROMETHAMINE 15 MG/ML IJ SOLN
15.0000 mg | Freq: Once | INTRAMUSCULAR | Status: AC
  Administered 2021-06-24: 15 mg via INTRAMUSCULAR

## 2021-06-24 MED ORDER — KETOROLAC TROMETHAMINE 15 MG/ML IJ SOLN
15.0000 mg | Freq: Once | INTRAMUSCULAR | Status: DC
  Filled 2021-06-24: qty 1

## 2021-06-24 MED ORDER — NAPROXEN 500 MG PO TABS: 500.0000 mg | ORAL_TABLET | Freq: Two times a day (BID) | ORAL | 0 refills | Status: DC

## 2021-06-24 NOTE — ED Notes (Signed)
Pt not present in waiting room when called for treatment room.  ?

## 2021-06-24 NOTE — Discharge Instructions (Addendum)
Please take naproxen twice daily as needed for right knee pain.  [facility] and [facility] with [facility].  Return to the emergency department for any other concerns you might have. ?

## 2021-06-24 NOTE — ED Provider Notes (Signed)
?[facility] ?Provider Note ? ? ?CSN: [id number] ?Arrival date & time: 06/24/21  1406 ? ?  ? ?History ?Chief Complaint  ?Patient presents with  ? Knee Pain  ? ? ?Derrick Dean is a 81 y.o. 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  Patient coming in stating he is having pain since the surgery.  They did not give him any pain medications to go home with.  He had a follow-up appointment a couple of weeks ago and that did not address his pain.  Patient has been ambulatory however it is difficult.  No fever or chills. ? ? ?Knee Pain ? ?  ? ?Home Medications ?Prior to Admission medications   ?Medication Sig Start Date End Date Taking? Authorizing Provider  ?naproxen (NAPROSYN) 500 MG tablet Take 1 tablet (500 mg total) by mouth 2 (two) times daily. 06/24/21  Yes Meredeth Ide, Nandana Krolikowski M, PA-C  ?allopurinol (ZYLOPRIM) 300 MG tablet Take 300 mg by mouth daily.    [provider]  ?amLODipine (NORVASC) 10 MG tablet Take 5 mg by mouth daily.    [provider]  ?aspirin EC 81 MG tablet Take 81 mg by mouth daily.    [provider]  ?azithromycin (ZITHROMAX) 250 MG tablet Take 1 tablet (250 mg total) by mouth daily. 01/29/21   Geoffery Lyons, MD  ?capsicum (ZOSTRIX) 0.075 % topical cream Apply 1 application topically 2 (two) times daily as needed. For pain    [provider]  ?cetirizine (ZYRTEC) 10 MG tablet Take 1 tablet (10 mg total) by mouth daily. 09/26/13   Regalado, Belkys A, MD  ?cholecalciferol (VITAMIN D) 1000 UNITS tablet Take 1,000 Units by mouth daily.    [provider]  ?gabapentin (NEURONTIN) 400 MG capsule Take 400 mg by mouth 3 (three) times daily.    [provider]  ?HYDROcodone-acetaminophen (NORCO/VICODIN) 5-325 MG per tablet Take 1 tablet by mouth every 4 (four) hours as needed. 11/20/13   Rolland Porter, MD  ?methocarbamol (ROBAXIN) 500 MG tablet Take 1 tablet (500 mg  total) by mouth 2 (two) times daily. 04/10/20   Terrilee Files, MD  ?mirtazapine (REMERON) 15 MG tablet Take 15 mg by mouth at bedtime.    [provider]  ?omeprazole (PRILOSEC) 20 MG capsule Take 20 mg by mouth daily.    [provider]  ?omeprazole (PRILOSEC) 20 MG capsule Take 1 capsule (20 mg total) by mouth daily. 11/04/14   Palumbo, April, MD  ?polyethylene glycol powder (MIRALAX) powder Take 17 g by mouth daily. 11/04/14   Palumbo, April, MD  ?pravastatin (PRAVACHOL) 80 MG tablet Take 40 mg by mouth at bedtime.    [provider]  ?prazosin (MINIPRESS) 1 MG capsule Take 1 mg by mouth at bedtime.    [provider]  ?tamsulosin (FLOMAX) 0.4 MG CAPS capsule Take 1 capsule (0.4 mg total) by mouth daily. 11/20/13   Rolland Porter, MD  ?vitamin B-12 (CYANOCOBALAMIN) 1000 MCG tablet Take 1,000 mcg by mouth daily.    [provider]  ?   ? ?Allergies    ?Lisinopril   ? ?Review of Systems   ?Review of Systems  ?All other systems reviewed and are negative. ? ?Physical Exam ?Updated Vital Signs ?BP (!) 158/71 (BP Location: Right Arm)   Pulse 80   Temp 98.3 ?F (36.8 ?C) (Oral)   Resp 17   SpO2 94%  ?Physical Exam ?Vitals and  nursing note reviewed.  ?Constitutional:   ?   Appearance: Normal appearance.  ?HENT:  ?   Head: Normocephalic and atraumatic.  ?Eyes:  ?   General:     ?   Right eye: No discharge.     ?   Left eye: No discharge.  ?   Conjunctiva/sclera: Conjunctivae normal.  ?Pulmonary:  ?   Effort: Pulmonary effort is normal.  ?Musculoskeletal:  ?   Comments: Minimal swelling.  There is a well-healing vertical surgical incision over the right knee.  Knee is nontender to palpation.  Patient does have full range of motion however it is painful.  ?Skin: ?   General: Skin is warm and dry.  ?   Findings: No rash.  ?Neurological:  ?   General: No focal deficit present.  ?   Mental Status: He is alert.  ?Psychiatric:     ?   Mood and Affect: Mood normal.     ?   Behavior:  Behavior normal.  ? ? ?ED Results / Procedures / Treatments   ?Labs ?(all labs ordered are listed, but only abnormal results are displayed) ?Labs Reviewed - No data to display ? ?EKG ?None ? ?Radiology ?DG Knee Complete 4 Views Right ? ?Result Date: 06/23/2021 ?CLINICAL DATA:  Postoperative surgery. 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 No evidence for hardware loosening or acute fracture. There is anterior soft tissue swelling compatible with recent surgery. IMPRESSION: 1. New right knee arthroplasty in anatomic alignment. 2. Anterior soft tissue swelling. Electronically Signed   By: Darliss Cheney M.D.   On: 06/23/2021 20:25   ? ?Procedures ?Procedures  ? ? ?Medications Ordered in ED ?Medications  ?ketorolac (TORADOL) 15 MG/ML injection 15 mg (has no administration in time range)  ? ? ?ED Course/ Medical Decision Making/ A&P ?Clinical Course as of 06/24/21 1521  ?Fri Jun 24, 2021  ?1515 I discussed this case with my attending physician who cosigned this note including patient's presenting symptoms, physical exam, and planned diagnostics and interventions. Attending physician stated agreement with plan or made changes to plan which were implemented.  ? ? [CF]  ?  ?Clinical Course User Index ?[CF] Honor Loh M, PA-C  ? ?                        ?Medical Decision Making ?Risk ?Prescription drug management. ? ? ?Derrick Dean is a 81 y.o. male who presents the emergency department with right knee pain.  I have a low suspicion for septic joint.  No obvious purulence draining from the sites.  Right knee is not significantly swollen.  He has had no fever his vital signs are normal today.  He is nontoxic-appearing and in no acute distress.  Sure decision-making was done with regards to work-up today.  Patient just wishes to have his pain under control.  He has not been taking anything at home for his  pain nor was he given anything.  I will give him a shot of Toradol in the department and discharge him home with naproxen with instructions to follow-up with the orthopedic surgical team that performed his surgery.  Strict return precautions were discussed with the patient at the bedside.  He is safe for discharge. ? ?Final Clinical Impression(s) / ED Diagnoses ?Final diagnoses:  ?Acute pain of right knee  ? ? ?Rx / DC  Orders ?ED Discharge Orders   ? ?      Ordered  ?  naproxen (NAPROSYN) 500 MG tablet  2 times daily       ? 06/24/21 1520  ? ?  ?  ? ?  ? ? ?  ?Teressa Lower, PA-C ?06/24/21 1521 ? ?  ?Glendora Score, MD ?06/24/21 1651 ? ?

## 2021-06-24 NOTE — ED Triage Notes (Signed)
Pain in upper right thigh area, had right knee replacement 05-25-21 ?

## 2021-08-20 ENCOUNTER — Emergency Department (HOSPITAL_COMMUNITY): Payer: No Typology Code available for payment source

## 2021-08-20 ENCOUNTER — Emergency Department (HOSPITAL_COMMUNITY)
Admission: EM | Admit: 2021-08-20 | Discharge: 2021-08-20 | Disposition: A | Discharge status: Home / Self Care | Payer: No Typology Code available for payment source | Attending: Emergency Medicine | Admitting: Emergency Medicine

## 2021-08-20 ENCOUNTER — Encounter (HOSPITAL_COMMUNITY): Payer: Self-pay

## 2021-08-20 ENCOUNTER — Other Ambulatory Visit: Payer: Self-pay

## 2021-08-20 DIAGNOSIS — S86911A Strain of unspecified muscle(s) and tendon(s) at lower leg level, right leg, initial encounter: Secondary | ICD-10-CM

## 2021-08-20 DIAGNOSIS — Z96651 Presence of right artificial knee joint: Secondary | ICD-10-CM | POA: Diagnosis not present

## 2021-08-20 DIAGNOSIS — Z7982 Long term (current) use of aspirin: Secondary | ICD-10-CM | POA: Insufficient documentation

## 2021-08-20 DIAGNOSIS — S8391XA Sprain of unspecified site of right knee, initial encounter: Secondary | ICD-10-CM | POA: Insufficient documentation

## 2021-08-20 DIAGNOSIS — W19XXXA Unspecified fall, initial encounter: Secondary | ICD-10-CM | POA: Insufficient documentation

## 2021-08-20 DIAGNOSIS — Z79899 Other long term (current) drug therapy: Secondary | ICD-10-CM | POA: Diagnosis not present

## 2021-08-20 DIAGNOSIS — S8991XA Unspecified injury of right lower leg, initial encounter: Secondary | ICD-10-CM | POA: Diagnosis present

## 2021-08-20 NOTE — ED Provider Notes (Signed)
?[facility] ?Provider Note ? ? ?CSN: [id number] ?Arrival date & time: 08/20/21  1443 ? ?  ? ?History ? ?Chief Complaint  ?Patient presents with  ? Knee Pain  ? ? ?Derrick Dean is a 81 y.o. male. ? ? ?Knee Pain ?Associated symptoms: no back pain, no fever and no neck pain   ? ?  ? ?Derrick Dean is a 81 y.o. male who presents to the Emergency Department complaining of pain of his right knee secondary to a mechanical fall that occurred 2 days ago.  States that his knee "gave away" he [place] knee [place] of [place]  He complains of persistent pain and weakness of his knee since his surgery.  He ambulates with a walker or cane at baseline.  He reports falling directly onto his right knee 2 days ago.  He has been taking meloxicam without significant relief.  He denies head injury, LOC, neck, hip or back pain. ? ?Home Medications ?Prior to Admission medications   ?Medication Sig Start Date End Date Taking? Authorizing Provider  ?allopurinol (ZYLOPRIM) 300 MG tablet Take 300 mg by mouth daily.    [provider]  ?amLODipine (NORVASC) 10 MG tablet Take 5 mg by mouth daily.    [provider]  ?aspirin EC 81 MG tablet Take 81 mg by mouth daily.    [provider]  ?azithromycin (ZITHROMAX) 250 MG tablet Take 1 tablet (250 mg total) by mouth daily. 01/29/21   Geoffery Lyons, MD  ?capsicum (ZOSTRIX) 0.075 % topical cream Apply 1 application topically 2 (two) times daily as needed. For pain    [provider]  ?cetirizine (ZYRTEC) 10 MG tablet Take 1 tablet (10 mg total) by mouth daily. 09/26/13   Regalado, Belkys A, MD  ?cholecalciferol (VITAMIN D) 1000 UNITS tablet Take 1,000 Units by mouth daily.    [provider]  ?gabapentin (NEURONTIN) 400 MG capsule Take 400 mg by mouth 3 (three) times daily.    [provider]  ?HYDROcodone-acetaminophen (NORCO/VICODIN) 5-325 MG per tablet Take 1 tablet  by mouth every 4 (four) hours as needed. 11/20/13   Rolland Porter, MD  ?methocarbamol (ROBAXIN) 500 MG tablet Take 1 tablet (500 mg total) by mouth 2 (two) times daily. 04/10/20   Terrilee Files, MD  ?mirtazapine (REMERON) 15 MG tablet Take 15 mg by mouth at bedtime.    [provider]  ?naproxen (NAPROSYN) 500 MG tablet Take 1 tablet (500 mg total) by mouth 2 (two) times daily. 06/24/21   Teressa Lower, PA-C  ?omeprazole (PRILOSEC) 20 MG capsule Take 20 mg by mouth daily.    [provider]  ?omeprazole (PRILOSEC) 20 MG capsule Take 1 capsule (20 mg total) by mouth daily. 11/04/14   Palumbo, April, MD  ?polyethylene glycol powder (MIRALAX) powder Take 17 g by mouth daily. 11/04/14   Palumbo, April, MD  ?pravastatin (PRAVACHOL) 80 MG tablet Take 40 mg by mouth at bedtime.    [provider]  ?prazosin (MINIPRESS) 1 MG capsule Take 1 mg by mouth at bedtime.    [provider]  ?tamsulosin (FLOMAX) 0.4 MG CAPS capsule Take 1 capsule (0.4 mg total) by mouth daily. 11/20/13   Rolland Porter, MD  ?vitamin B-12 (CYANOCOBALAMIN) 1000 MCG tablet Take 1,000 mcg by mouth daily.    [provider]  ?   ? ?Allergies    ?Lisinopril   ? ?Review of  Systems   ?Review of Systems  ?Constitutional:  Negative for fever.  ?Cardiovascular:  Negative for chest pain.  ?Gastrointestinal:  Negative for nausea and vomiting.  ?Musculoskeletal:  Positive for arthralgias (Right knee pain). Negative for back pain and neck pain.  ?Skin:  Negative for color change and wound.  ?Neurological:  Negative for weakness and numbness.  ? ?Physical Exam ?Updated Vital Signs ?BP 127/64 (BP Location: Right Arm)   Pulse 89   Temp 98 ?F (36.7 ?C) (Oral)   Resp 17   Ht  (1.803 m)   Wt 72.9 kg   SpO2 99%   BMI 22.43 kg/m?  ?Physical Exam ?Vitals and nursing note reviewed.  ?Constitutional:   ?   General: He is not in acute distress. ?   Appearance: Normal appearance. He is not ill-appearing.  ?Cardiovascular:   ?   Rate and Rhythm: Normal rate and regular rhythm.  ?Pulmonary:  ?   Effort: Pulmonary effort is normal.  ?Musculoskeletal:     ?   General: Tenderness (Diffuse tenderness to palpation of the anterior right knee.  Well-healed surgical incision.  No excessive warmth or erythema.  No palpable effusion.) and signs of injury present. Normal range of motion.  ?   Cervical back: Normal range of motion. No tenderness.  ?Skin: ?   General: Skin is warm.  ?   Capillary Refill: Capillary refill takes less than 2 seconds.  ?   Findings: No erythema or rash.  ?Neurological:  ?   General: No focal deficit present.  ?   Mental Status: He is alert.  ?   Sensory: No sensory deficit.  ?   Motor: No weakness.  ? ? ?ED Results / Procedures / Treatments   ?Labs ?(all labs ordered are listed, but only abnormal results are displayed) ?Labs Reviewed - No data to display ? ?EKG ?None ? ?Radiology ?DG Knee Complete 4 Views Right ? ?Result Date: 08/20/2021 ?CLINICAL DATA:  Right knee pain, history of right knee surgery. EXAM: RIGHT KNEE - COMPLETE 4+ VIEW COMPARISON:  Right knee radiographs 06/23/2021 FINDINGS: Status post total knee replacement. Hardware intact. No evidence of fracture, dislocation, or joint effusion. No evidence of arthropathy or other focal bone abnormality. Soft tissues are unremarkable. IMPRESSION: Status post total knee replacement. No acute finding. Electronically Signed   By: Emmaline Kluver M.D.   On: 08/20/2021 15:36   ? ?Procedures ?Procedures  ? ? ?Medications Ordered in ED ?Medications - No data to display ? ?ED Course/ Medical Decision Making/ A&P ?  ?                        ?Medical Decision Making ?Patient here for evaluation of right knee pain secondary to mechanical fall 2 days ago.  History of total knee replacement in February of this year.  He complains of pain in weakness of his knee since his surgery.  States his knee gave way causing him to fall. ? ?On exam, diffuse tenderness to anterior knee, no  excessive erythema or warmth.  He has full range of motion of the knee with palpable effusion or bony deformities.  Will obtain imaging of the knee. ? ?Amount and/or Complexity of Data Reviewed ?Radiology: ordered. ?   Details: X-ray of the right knee shows status post total knee replacement without acute finding I agree with radiology interpretation ? ? ?Discussed x-ray findings with the patient.  No clinical suspicion for septic joint.  Neurovascularly intact. ?  Knee sleeve applied for support and comfort.  Patient endorses frequent falls since his knee surgery, he has a walker at home which I have recommended that he use instead of a cane.  He has taken anti-inflammatory we will continue with this treatment.  He appears appropriate for discharge home ? ? ? ? ? ? ? ? ? ?Final Clinical Impression(s) / ED Diagnoses ?Final diagnoses:  ?Strain of right knee, initial encounter  ? ? ?Rx / DC Orders ?ED Discharge Orders   ? ? None  ? ?  ? ? ?  ?Pauline Ausriplett, Tauno Falotico, PA-C ?08/20/21 1608 ? ?  ?Bethann BerkshireZammit, Joseph, MD ?08/21/21 1245 ? ?

## 2021-08-20 NOTE — ED Triage Notes (Signed)
Pt c/o fell on Thurs and right knee keeps hurting. Pt ambulated into ED with cane.  ?

## 2021-08-20 NOTE — ED Notes (Signed)
Patient transported to X-ray 

## 2021-08-20 NOTE — Discharge Instructions (Signed)
Your x-ray today did not show any broken bones or dislocations.  The hardware in your knee appears intact.  I recommend you need to use the knee sleeve when walking or standing for added support.  You may apply ice packs on and off to your knee to help with inflammation.  Follow-up with your primary care provider for recheck. ?

## 2021-08-31 ENCOUNTER — Other Ambulatory Visit: Payer: Self-pay

## 2021-08-31 ENCOUNTER — Emergency Department (HOSPITAL_COMMUNITY)
Admission: EM | Admit: 2021-08-31 | Discharge: 2021-08-31 | Disposition: A | Discharge status: Home / Self Care | Payer: No Typology Code available for payment source | Attending: Emergency Medicine | Admitting: Emergency Medicine

## 2021-08-31 ENCOUNTER — Emergency Department (HOSPITAL_COMMUNITY): Payer: No Typology Code available for payment source

## 2021-08-31 ENCOUNTER — Encounter (HOSPITAL_COMMUNITY): Payer: Self-pay

## 2021-08-31 DIAGNOSIS — Z7982 Long term (current) use of aspirin: Secondary | ICD-10-CM | POA: Diagnosis not present

## 2021-08-31 DIAGNOSIS — R002 Palpitations: Secondary | ICD-10-CM | POA: Diagnosis present

## 2021-08-31 DIAGNOSIS — I10 Essential (primary) hypertension: Secondary | ICD-10-CM | POA: Insufficient documentation

## 2021-08-31 DIAGNOSIS — Z79899 Other long term (current) drug therapy: Secondary | ICD-10-CM | POA: Insufficient documentation

## 2021-08-31 LAB — CBC
HCT: 39.5 % (ref 39.0–52.0)
Hemoglobin: 12.8 g/dL — ABNORMAL LOW (ref 13.0–17.0)
MCH: 28.8 pg (ref 26.0–34.0)
MCHC: 32.4 g/dL (ref 30.0–36.0)
MCV: 89 fL (ref 80.0–100.0)
Platelets: 334 10*3/uL (ref 150–400)
RBC: 4.44 MIL/uL (ref 4.22–5.81)
RDW: 16.8 % — ABNORMAL HIGH (ref 11.5–15.5)
WBC: 5.2 10*3/uL (ref 4.0–10.5)
nRBC: 0 % (ref 0.0–0.2)

## 2021-08-31 LAB — BASIC METABOLIC PANEL
Anion gap: 10 (ref 5–15)
BUN: 14 mg/dL (ref 8–23)
CO2: 25 mmol/L (ref 22–32)
Calcium: 9.2 mg/dL (ref 8.9–10.3)
Chloride: 107 mmol/L (ref 98–111)
Creatinine, Ser: 0.81 mg/dL (ref 0.61–1.24)
GFR, Estimated: >60 mL/min (ref >60)
Glucose, Bld: 100 mg/dL — ABNORMAL HIGH (ref 70–99)
Potassium: 4 mmol/L (ref 3.5–5.1)
Sodium: 142 mmol/L (ref 135–145)

## 2021-08-31 LAB — TROPONIN I (HIGH SENSITIVITY): Troponin I (High Sensitivity): 12 ng/L (ref <18)

## 2021-08-31 NOTE — ED Triage Notes (Signed)
Pt c/o several episodes of heart palpitations today.  Denies pain and SOB.  Pt reports previous episodes of palpitations and was seen, but did not receive a clear diagnosis.  ?

## 2021-08-31 NOTE — Discharge Instructions (Addendum)
Your testing was reassuring, there are no abnormal findings.  You are free to follow-up with your doctor but please return to the emergency department for any severe worsening symptoms, ? ?I would like you to see your family doctor within 1 week ?

## 2021-08-31 NOTE — ED Provider Notes (Signed)
?[facility] ?Provider Note ? ? ?CSN: [id number] ?Arrival date & time: 08/31/21  1731 ? ?  ? ?History ? ?Chief Complaint  ?Patient presents with  ? Palpitations  ? ? ?Derrick Dean is a 81 y.o. male. ? ? ?Palpitations ? ?This patient is an 81 year old male with a history of hypertension on amlodipine, high cholesterol on pravastatin, acid reflux on omeprazole and chronic neuropathy on gabapentin presenting with a complaint of palpitations that he describes as an "ripple" in his chest that last for couple seconds and then goes away.  There is no associated chest pain or shortness of breath with this.  He has mild swelling in his legs but has recently had knee surgery on the right.  He denies any shortness of breath with exertion any chest pain with exertion and at this time is totally symptom-free.  This happened just prior to arrival, it has not happened since he was brought back to treatment area. ? ?Home Medications ?Prior to Admission medications   ?Medication Sig Start Date End Date Taking? Authorizing Provider  ?allopurinol (ZYLOPRIM) 300 MG tablet Take 300 mg by mouth daily.    [provider]  ?amLODipine (NORVASC) 10 MG tablet Take 5 mg by mouth daily.    [provider]  ?aspirin EC 81 MG tablet Take 81 mg by mouth daily.    [provider]  ?azithromycin (ZITHROMAX) 250 MG tablet Take 1 tablet (250 mg total) by mouth daily. 01/29/21   Veryl Speak, MD  ?capsicum (ZOSTRIX) 0.075 % topical cream Apply 1 application topically 2 (two) times daily as needed. For pain    [provider]  ?cetirizine (ZYRTEC) 10 MG tablet Take 1 tablet (10 mg total) by mouth daily. 09/26/13   Regalado, Belkys A, MD  ?cholecalciferol (VITAMIN D) 1000 UNITS tablet Take 1,000 Units by mouth daily.    [provider]  ?gabapentin (NEURONTIN) 400 MG capsule Take 400 mg by mouth 3 (three) times daily.    [provider]  ?HYDROcodone-acetaminophen  (NORCO/VICODIN) 5-325 MG per tablet Take 1 tablet by mouth every 4 (four) hours as needed. 11/20/13   Tanna Furry, MD  ?methocarbamol (ROBAXIN) 500 MG tablet Take 1 tablet (500 mg total) by mouth 2 (two) times daily. 04/10/20   Hayden Rasmussen, MD  ?mirtazapine (REMERON) 15 MG tablet Take 15 mg by mouth at bedtime.    [provider]  ?naproxen (NAPROSYN) 500 MG tablet Take 1 tablet (500 mg total) by mouth 2 (two) times daily. 06/24/21   Hendricks Limes, PA-C  ?omeprazole (PRILOSEC) 20 MG capsule Take 20 mg by mouth daily.    [provider]  ?omeprazole (PRILOSEC) 20 MG capsule Take 1 capsule (20 mg total) by mouth daily. 11/04/14   Palumbo, April, MD  ?polyethylene glycol powder (MIRALAX) powder Take 17 g by mouth daily. 11/04/14   Palumbo, April, MD  ?pravastatin (PRAVACHOL) 80 MG tablet Take 40 mg by mouth at bedtime.    [provider]  ?prazosin (MINIPRESS) 1 MG capsule Take 1 mg by mouth at bedtime.    [provider]  ?tamsulosin (FLOMAX) 0.4 MG CAPS capsule Take 1 capsule (0.4 mg total) by mouth daily. 11/20/13   Tanna Furry, MD  ?vitamin B-12 (CYANOCOBALAMIN) 1000 MCG tablet Take 1,000 mcg by mouth daily.    [provider]  ?   ? ?Allergies    ?Lisinopril   ? ?Review of Systems   ?Review of Systems  ?Cardiovascular:  Positive for palpitations.  ?All other systems reviewed and are negative. ? ?Physical Exam ?Updated Vital Signs ?BP (!) 153/81   Pulse 71   Temp 98 ?F (36.7 ?C) (Oral)   Resp 15   Ht 1.803 m (5\' 11" )   Wt 72.6 kg   SpO2 100%   BMI 22.32 kg/m?  ?Physical Exam ?Vitals and nursing note reviewed.  ?Constitutional:   ?   General: He is not in acute distress. ?   Appearance: He is well-developed.  ?HENT:  ?   Head: Normocephalic and atraumatic.  ?   Mouth/Throat:  ?   Pharynx: No oropharyngeal exudate.  ?Eyes:  ?   General: No scleral icterus.    ?   Right eye: No discharge.     ?   Left eye: No discharge.  ?   Conjunctiva/sclera: Conjunctivae  normal.  ?   Pupils: Pupils are equal, round, and reactive to light.  ?Neck:  ?   Thyroid: No thyromegaly.  ?   Vascular: No JVD.  ?Cardiovascular:  ?   Rate and Rhythm: Normal rate and regular rhythm.  ?   Heart sounds: Normal heart sounds. No murmur heard. ?  No friction rub. No gallop.  ?Pulmonary:  ?   Effort: Pulmonary effort is normal. No respiratory distress.  ?   Breath sounds: Rales present. No wheezing.  ?   Comments: This patient speaks in full sentences without any distress or increased work of breathing, there is no accessory muscle use, no splinting, no wheezing, no rhonchi.  He does have rales with deep inspiration that does not clear with coughing. ?Abdominal:  ?   General: Bowel sounds are normal. There is no distension.  ?   Palpations: Abdomen is soft. There is no mass.  ?   Tenderness: There is no abdominal tenderness.  ?Musculoskeletal:     ?   General: No tenderness. Normal range of motion.  ?   Cervical back: Normal range of motion and neck supple.  ?   Right lower leg: No edema.  ?   Left lower leg: No edema.  ?   Comments: No significant asymmetrical pitting edema of the legs  ?Lymphadenopathy:  ?   Cervical: No cervical adenopathy.  ?Skin: ?   General: Skin is warm and dry.  ?   Findings: No erythema or rash.  ?Neurological:  ?   General: No focal deficit present.  ?   Mental Status: He is alert.  ?   Coordination: Coordination normal.  ?Psychiatric:     ?   Behavior: Behavior normal.  ? ? ?ED Results / Procedures / Treatments   ?Labs ?(all labs ordered are listed, but only abnormal results are displayed) ?Labs Reviewed  ?BASIC METABOLIC PANEL - Abnormal; Notable for the following components:  ?    Result Value  ? Glucose, Bld 100 (*)   ? All other components within normal limits  ?CBC - Abnormal; Notable for the following components:  ? Hemoglobin 12.8 (*)   ? RDW 16.8 (*)   ? All other components within normal limits  ?TROPONIN I (HIGH SENSITIVITY)  ? ? ?EKG ?EKG  Interpretation ? ?Date/Time:  Wednesday Aug 31 2021 17:49:59 EDT ?Ventricular Rate:  79 ?PR Interval:  126 ?QRS Duration: 84 ?QT Interval:  362 ?QTC Calculation: 415 ?R Axis:   73 ?Text Interpretation: Normal sinus rhythm Right atrial enlargement ST & T wave abnormality, consider lateral ischemia Abnormal ECG When compared with ECG of 10-Apr-2020 08:19,  PREVIOUS ECG IS PRESENT Confirmed by Noemi Chapel [phone]) on 08/31/2021 6:21:21 PM ? ?Radiology ?DG Chest Port 1 View ? ?Result Date: 08/31/2021 ?CLINICAL DATA:  Chest pain. EXAM: PORTABLE CHEST 1 VIEW COMPARISON:  January 28, 2021. FINDINGS: The heart size and mediastinal contours are within normal limits. Both lungs are clear. The visualized skeletal structures are unremarkable. IMPRESSION: No active disease. Electronically Signed   By: Marijo Conception M.D.   On: 08/31/2021 18:16   ? ?Procedures ?Procedures  ? ? ?Medications Ordered in ED ?Medications - No data to display ? ?ED Course/ Medical Decision Making/ A&P ?  ?                        ?Medical Decision Making ?Amount and/or Complexity of Data Reviewed ?Labs: ordered. ?Radiology: ordered. ? ? ?The patient is complaining of what sounds like palpitations, could be an occasional PVC or PAC though on the monitor everything looks to be normal sinus rhythm.  EKG is also consistent with normal sinus rhythm.  Normal axis intervals ST segments and T waves.  He does have some atrial hypertrophy based on P wave amplitude, no ectopy seen, no arrhythmia seen. ? ?The patient has rales on exam but has no pulmonary symptoms and has x-rays in the past that showed some scarring at the bases, he has no history of pulmonary fibrosis or interstitial lung disease and he stopped smoking 20 or more years ago.  X-rays been obtained, labs obtained, no leukocytosis or anemia. ? ?Labs unremarkable, patient very stable appearing, negative metabolic panel CBC and troponin, chest x-ray also unremarkable ? ?No findings of interstitial  pulmonary fibrosis pulmonary edema, rales may be related to chronic scarring.  Patient without hypoxia tachycardia or significant hypertension ? ? ? ? ? ? ? ?Final Clinical Impression(s) / ED Diagnoses ?Final diagnoses:  ?Palpitations  ? ? ?Rx / DC Orders

## 2021-09-28 ENCOUNTER — Ambulatory Visit (INDEPENDENT_AMBULATORY_CARE_PROVIDER_SITE_OTHER): Payer: No Typology Code available for payment source

## 2021-09-28 ENCOUNTER — Ambulatory Visit
Admission: EM | Admit: 2021-09-28 | Discharge: 2021-09-28 | Disposition: A | Discharge status: Home / Self Care | Payer: No Typology Code available for payment source | Attending: Family Medicine | Admitting: Family Medicine

## 2021-09-28 DIAGNOSIS — M25551 Pain in right hip: Secondary | ICD-10-CM | POA: Diagnosis not present

## 2021-09-28 DIAGNOSIS — M79604 Pain in right leg: Secondary | ICD-10-CM | POA: Diagnosis not present

## 2021-09-28 MED ORDER — ACETAMINOPHEN 500 MG PO TABS: 1000.0000 mg | ORAL_TABLET | Freq: Four times a day (QID) | ORAL | 0 refills | Status: DC | PRN

## 2021-09-28 MED ORDER — CYCLOBENZAPRINE HCL 5 MG PO TABS: 5.0000 mg | ORAL_TABLET | Freq: Every evening | ORAL | 0 refills | Status: DC | PRN

## 2021-09-28 NOTE — ED Triage Notes (Signed)
Pt reports right hip pain x 1 year; right upper right leg x 3 months. Pt reports he had hip surgery 3 years ago, right knee surgery in February 2023. Pain is worse when walking and when he wake up in the morning.

## 2021-09-30 NOTE — ED Provider Notes (Signed)
[facility]    CSN: [id number] Arrival date & time: 09/28/21  1611      History   Chief Complaint Chief Complaint  Patient presents with   Hip Pain   Leg Pain         HPI Derrick Dean is a 81 y.o. male.   Presenting today with about a year of right hip pain and now several months of right upper leg pain going down to the knee.  He denies any recent injury, numbness, tingling, weakness.  Had hip surgery about 3 years ago and right knee surgery February 2023.  Not taking anything over-the-counter for symptoms at this time.    Past Medical History:  Diagnosis Date   Anemia    Appendicitis    Arthritis    B12 nutritional deficiency    on supplement   GERD (gastroesophageal reflux disease)    Hepatitis C infection    not treated - pending appointment in april 2015   History of heart artery stent    Hypertension    Presbyopia    wears bifocals   Trouble in sleeping     Patient Active Problem List   Diagnosis Date Noted   Other abnormalities of gait and mobility 01/03/2021   Gout 01/03/2021   Posttraumatic stress disorder 01/03/2021   Gastroesophageal reflux disease 01/03/2021   Hyperlipidemia 01/03/2021   Vitamin B12 deficiency 01/03/2021   Shoulder pain 01/03/2021   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   Coronary atherosclerosis 01/03/2021   Chronic midline low back pain without sciatica 05/24/2018   Lumbar spondylosis 05/24/2018   Pain syndrome, chronic 06/15/2017   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/15/2017   Sprain of right wrist 10/31/2016   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   S/P ORIF (open reduction internal fixation) fracture 05/05/2016   Closed fracture of right distal radius 04/18/2016   Common cold 09/25/2013   CAD S/P percutaneous coronary angioplasty 09/25/2013   HCV (hepatitis C virus) 05/15/2013   Cholelithiasis 05/15/2013   Abdominal pain, epigastric 05/15/2013   Pancreatitis, acute 05/13/2013   Unspecified  essential hypertension 05/13/2013   Transaminasemia 05/13/2013   Chest pain 05/12/2013    Past Surgical History:  Procedure Laterality Date   APPENDECTOMY     CARDIAC CATHETERIZATION     7 YRS AGO W/STENT   Left shoulder surgery     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 Authorizing Provider  acetaminophen (TYLENOL) 500 MG tablet Take 2 tablets (1,000 mg total) by mouth every 6 (six) hours as needed. 09/28/21  Yes Particia Nearing, PA-C  cyclobenzaprine (FLEXERIL) 5 MG tablet Take 1 tablet (5 mg total) by mouth at bedtime as needed for muscle spasms. Do not drink alcohol or drive while taking this medication.  May cause drowsiness. 09/28/21  Yes Particia Nearing, PA-C  allopurinol (ZYLOPRIM) 300 MG tablet Take 300 mg by mouth daily.    [provider]  amLODipine (NORVASC) 10 MG tablet Take 5 mg by mouth daily.    [provider]  aspirin EC 81 MG tablet Take 81 mg by mouth daily.    [provider]  azithromycin (ZITHROMAX) 250 MG tablet Take 1 tablet (250 mg total) by mouth daily. 01/29/21   Geoffery Lyons, MD  capsicum (ZOSTRIX) 0.075 %  topical cream Apply 1 application topically 2 (two) times daily as needed. For pain    [provider]  cetirizine (ZYRTEC) 10 MG tablet Take 1 tablet (10 mg total) by mouth daily. 09/26/13   Regalado, Belkys A, MD  cholecalciferol (VITAMIN D) 1000 UNITS tablet Take 1,000 Units by mouth daily.    [provider]  gabapentin (NEURONTIN) 400 MG capsule Take 400 mg by mouth 3 (three) times daily.    [provider]  HYDROcodone-acetaminophen (NORCO/VICODIN) 5-325 MG per tablet Take 1 tablet by mouth every 4 (four) hours as needed. 11/20/13   Rolland Porter, MD  methocarbamol (ROBAXIN) 500 MG tablet Take 1 tablet (500 mg total) by  mouth 2 (two) times daily. 04/10/20   Terrilee Files, MD  mirtazapine (REMERON) 15 MG tablet Take 15 mg by mouth at bedtime.    [provider]  naproxen (NAPROSYN) 500 MG tablet Take 1 tablet (500 mg total) by mouth 2 (two) times daily. 06/24/21   Honor Loh M, PA-C  omeprazole (PRILOSEC) 20 MG capsule Take 20 mg by mouth daily.    [provider]  omeprazole (PRILOSEC) 20 MG capsule Take 1 capsule (20 mg total) by mouth daily. 11/04/14   Palumbo, April, MD  polyethylene glycol powder (MIRALAX) powder Take 17 g by mouth daily. 11/04/14   Palumbo, April, MD  pravastatin (PRAVACHOL) 80 MG tablet Take 40 mg by mouth at bedtime.    [provider]  prazosin (MINIPRESS) 1 MG capsule Take 1 mg by mouth at bedtime.    [provider]  tamsulosin (FLOMAX) 0.4 MG CAPS capsule Take 1 capsule (0.4 mg total) by mouth daily. 11/20/13   Rolland Porter, MD  vitamin B-12 (CYANOCOBALAMIN) 1000 MCG tablet Take 1,000 mcg by mouth daily.    [provider]    Family History Family History  Problem Relation Age of Onset   Coronary artery disease Mother    Thyroid disease Mother    Heart disease Mother    Coronary artery disease Father    Heart disease Father     Social History Social History   Tobacco Use   Smoking status: Former    Types: Cigarettes    Quit date: 09/14/2001    Years since quitting: 20.0   Smokeless tobacco: Never  Vaping Use   Vaping Use: Never used  Substance Use Topics   Alcohol use: Yes    Alcohol/week: 1.0 standard drink of alcohol    Types: 1 Cans of beer per week    Comment: 3x week   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 --  Excl. in GC? --    No data found.  Updated Vital Signs BP (!) 150/65 (BP Location: Right Arm)   Pulse (!) 54   Temp 98.4 F (36.9 C) (Oral)   Resp 18   SpO2 94%   Visual Acuity Right Eye Distance:   Left Eye Distance:   Bilateral Distance:    Right Eye Near:   Left Eye Near:    Bilateral Near:     Physical Exam Vitals and nursing note reviewed.  Constitutional:      Appearance: Normal appearance.  HENT:     Head: Atraumatic.  Eyes:     Extraocular Movements: Extraocular movements intact.     Conjunctiva/sclera: Conjunctivae normal.  Cardiovascular:     Rate and Rhythm: Normal rate and regular rhythm.  Pulmonary:     Effort: Pulmonary effort is normal.     Breath sounds: Normal breath sounds.  Musculoskeletal:        General: No swelling, tenderness, deformity or signs of injury. Normal range of motion.     Cervical back: Normal range of motion and neck supple.     Comments: Range of motion at baseline per patient  Skin:    General: Skin is warm and dry.     Findings: No erythema.  Neurological:     General: No focal deficit present.     Mental Status: He is oriented to person, place, and time.     Motor: No weakness.     Gait: Gait normal.     Comments: Right lower extremity neurovascular intact  Psychiatric:        Mood and Affect: Mood normal.        Thought Content: Thought content normal.        Judgment: Judgment normal.    UC Treatments / Results  Labs (all labs ordered are listed, but only abnormal results are displayed) Labs Reviewed - No data to display  EKG   Radiology No results found.  Procedures Procedures (including critical care time)  Medications Ordered in UC Medications - No data to  display  Initial Impression / Assessment and Plan / UC Course  I have reviewed the triage vital signs and the nursing notes.  Pertinent labs & imaging results that were available during my care of the patient were reviewed by me and considered in my medical decision making (see chart for details).     Suspect chronic arthritis, inflammatory pains the patient requesting right hip x-ray.  Did discuss with him that this would likely be negative as he had no point tenderness, injury, decreased range of motion.  Right hip x-ray was in fact negative for acute bony abnormality.  Discussed treatment with Flexeril, Tylenol and follow-up with orthopedics.  Return for worsening symptoms.  Final Clinical Impressions(s) / UC Diagnoses   Final diagnoses:  Right hip pain  Right leg pain   Discharge Instructions   None    ED Prescriptions     Medication Sig Dispense Auth. Provider   acetaminophen (TYLENOL) 500 MG tablet Take 2 tablets (1,000 mg total) by mouth every 6 (six) hours as needed. 30 tablet Particia Nearing, [place]   cyclobenzaprine (FLEXERIL) 5 MG tablet Take 1 tablet (5 mg total) by mouth at bedtime as needed for muscle spasms. Do not drink alcohol or drive while taking this medication.  May cause drowsiness. 10 tablet Particia Nearing, [place]      PDMP not reviewed this encounter.   Particia Nearing, [place]  09/30/21 2002  

## 2021-12-20 ENCOUNTER — Ambulatory Visit
Admission: EM | Admit: 2021-12-20 | Discharge: 2021-12-20 | Disposition: A | Discharge status: Home / Self Care | Payer: No Typology Code available for payment source | Attending: Nurse Practitioner | Admitting: Nurse Practitioner

## 2021-12-20 ENCOUNTER — Ambulatory Visit (INDEPENDENT_AMBULATORY_CARE_PROVIDER_SITE_OTHER): Payer: Medicare Other

## 2021-12-20 DIAGNOSIS — M545 Low back pain, unspecified: Secondary | ICD-10-CM

## 2021-12-20 DIAGNOSIS — M25551 Pain in right hip: Secondary | ICD-10-CM | POA: Diagnosis not present

## 2021-12-20 MED ORDER — ACETAMINOPHEN ER 650 MG PO TBCR: 650.0000 mg | EXTENDED_RELEASE_TABLET | Freq: Three times a day (TID) | ORAL | 0 refills | Status: DC | PRN

## 2021-12-20 NOTE — ED Triage Notes (Signed)
Pt reports hip pain and right knee pain since February 2023, after right knee replacement. Pt has not taken any medications for complaints. Pain is worse when walking.

## 2021-12-20 NOTE — ED Provider Notes (Signed)
[facility]    CSN: [id number] Arrival date & time: 12/20/21  1541      History   Chief Complaint Chief Complaint  Patient presents with   Hip Pain   Knee Pain    HPI Derrick Dean is a 81 y.o. male.   The history is provided by the patient.   Patient presents for complaints of right-sided low back pain and right hip pain that been present for the past 4 months.  Patient states that he has a history of a right hip replacement and a right knee replacement.  States that he had intermittent pain since his surgeries.  He states in May he had "a bad fall" and his pain has worsened since that time.  He states this morning when he got up, he had difficulty getting out of the bed.  He states the pain worsens when he goes from a sitting to a standing position and from a lying to a sitting position.  He cannot tell if he has had any swelling to the right hip.  Patient states that pain also worsens when he walks.  Patient has not been taking any medication for his symptoms.  He denies numbness, tingling, duration of pain, or inability to bear weight. Past Medical History:  Diagnosis Date   Anemia    Appendicitis    Arthritis    B12 nutritional deficiency    on supplement   GERD (gastroesophageal reflux disease)    Hepatitis C infection    not treated - pending appointment in april 2015   History of heart artery stent    Hypertension    Presbyopia    wears bifocals   Trouble in sleeping     Patient Active Problem List   Diagnosis Date Noted   Other abnormalities of gait and mobility 01/03/2021   Gout 01/03/2021   Posttraumatic stress disorder 01/03/2021   Gastroesophageal reflux disease 01/03/2021   Hyperlipidemia 01/03/2021   Vitamin B12 deficiency 01/03/2021   Shoulder pain 01/03/2021   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   Coronary atherosclerosis 01/03/2021   Chronic midline low back pain without sciatica 05/24/2018   Lumbar spondylosis 05/24/2018    Pain syndrome, chronic 06/15/2017   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/15/2017   Sprain of right wrist 10/31/2016   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   S/P ORIF (open reduction internal fixation) fracture 05/05/2016   Closed fracture of right distal radius 04/18/2016   Common cold 09/25/2013   CAD S/P percutaneous coronary angioplasty 09/25/2013   HCV (hepatitis C virus) 05/15/2013   Cholelithiasis 05/15/2013   Abdominal pain, epigastric 05/15/2013   Pancreatitis, acute 05/13/2013   Unspecified essential hypertension 05/13/2013   Transaminasemia 05/13/2013   Chest pain 05/12/2013    Past Surgical History:  Procedure Laterality Date   APPENDECTOMY     CARDIAC CATHETERIZATION     7 YRS AGO W/STENT   Left shoulder surgery     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 Authorizing Provider  acetaminophen (TYLENOL 8 HOUR ARTHRITIS PAIN) 650 MG CR tablet Take 1 tablet (650 mg total) by mouth every 8 (eight) hours as needed for pain. 12/20/21  Yes Mallisa Alameda-Warren, Sadie Haber, NP  allopurinol (ZYLOPRIM) 300 MG tablet Take 300 mg by mouth daily.  [provider]  amLODipine (NORVASC) 10 MG tablet Take 5 mg by mouth daily.    [provider]  aspirin EC 81 MG tablet Take 81 mg by mouth daily.    [provider]  azithromycin (ZITHROMAX) 250 MG tablet Take 1 tablet (250 mg total) by mouth daily. 01/29/21   Geoffery Lyons, MD  capsicum (ZOSTRIX) 0.075 % topical cream Apply 1 application topically 2 (two) times daily as needed. For pain    [provider]  cetirizine (ZYRTEC) 10 MG tablet Take 1 tablet (10 mg total) by mouth daily. 09/26/13   Regalado, Belkys A, MD  cholecalciferol (VITAMIN D) 1000 UNITS tablet Take 1,000 Units by mouth daily.    [provider]  cyclobenzaprine  (FLEXERIL) 5 MG tablet Take 1 tablet (5 mg total) by mouth at bedtime as needed for muscle spasms. Do not drink alcohol or drive while taking this medication.  May cause drowsiness. 09/28/21   Particia Nearing, PA-C  gabapentin (NEURONTIN) 400 MG capsule Take 400 mg by mouth 3 (three) times daily.    [provider]  HYDROcodone-acetaminophen (NORCO/VICODIN) 5-325 MG per tablet Take 1 tablet by mouth every 4 (four) hours as needed. 11/20/13   Rolland Porter, MD  methocarbamol (ROBAXIN) 500 MG tablet Take 1 tablet (500 mg total) by mouth 2 (two) times daily. 04/10/20   Terrilee Files, MD  mirtazapine (REMERON) 15 MG tablet Take 15 mg by mouth at bedtime.    [provider]  naproxen (NAPROSYN) 500 MG tablet Take 1 tablet (500 mg total) by mouth 2 (two) times daily. 06/24/21   Honor Loh M, PA-C  omeprazole (PRILOSEC) 20 MG capsule Take 20 mg by mouth daily.    [provider]  omeprazole (PRILOSEC) 20 MG capsule Take 1 capsule (20 mg total) by mouth daily. 11/04/14   Palumbo, April, MD  polyethylene glycol powder (MIRALAX) powder Take 17 g by mouth daily. 11/04/14   Palumbo, April, MD  pravastatin (PRAVACHOL) 80 MG tablet Take 40 mg by mouth at bedtime.    [provider]  prazosin (MINIPRESS) 1 MG capsule Take 1 mg by mouth at bedtime.    [provider]  tamsulosin (FLOMAX) 0.4 MG CAPS capsule Take 1 capsule (0.4 mg total) by mouth daily. 11/20/13   Rolland Porter, MD  vitamin B-12 (CYANOCOBALAMIN) 1000 MCG tablet Take 1,000 mcg by mouth daily.    [provider]    Family History Family History  Problem Relation Age of Onset   Coronary artery disease Mother    Thyroid disease Mother    Heart disease Mother    Coronary artery disease Father    Heart disease Father     Social History Social History   Tobacco Use   Smoking status: Former    Types: Cigarettes    Quit date: 09/14/2001    Years since quitting: 20.2   Smokeless tobacco:  Never  Vaping Use   Vaping Use: Never used  Substance Use Topics   Alcohol use: Yes    Alcohol/week: 1.0 standard drink of alcohol    Types: 1 Cans of beer per week    Comment: 3x week   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 --      Excl. in GC? --    No data found.  Updated Vital Signs BP 126/67 (BP Location: Right Arm)   Pulse 66   Temp 98.2 F (36.8 C) (Oral)   Resp 18   SpO2 97%   Visual Acuity Right Eye Distance:   Left Eye Distance:   Bilateral Distance:    Right Eye Near:   Left Eye Near:    Bilateral Near:     Physical Exam Vitals and nursing note reviewed.  Constitutional:      General: He is not in acute distress.    Appearance: Normal appearance.  Cardiovascular:     Rate and Rhythm: Normal rate and regular rhythm.     Pulses: Normal pulses.     Heart sounds: Normal heart sounds.  Pulmonary:     Effort: Pulmonary effort is normal.     Breath sounds: Normal breath sounds.  Abdominal:     General: Bowel sounds are normal.     Palpations: Abdomen is soft.  Musculoskeletal:     Lumbar back: Tenderness present. No swelling, deformity, lacerations, spasms or bony tenderness. Decreased range of motion. Negative right straight leg raise test and negative left straight leg raise test.     Right hip: Tenderness present. No deformity or lacerations. Decreased range of motion. Normal strength.  Skin:    General: Skin is warm and dry.   Neurological:     General: No focal deficit present.     Mental Status: He is alert and oriented to person, place, and time.  Psychiatric:        Mood and Affect: Mood normal.        Behavior: Behavior normal.      UC Treatments / Results  Labs (all labs ordered are listed, but only abnormal results are displayed) Labs Reviewed - No data to display  EKG   Radiology DG Lumbar Spine Complete  Result Date: 12/20/2021 CLINICAL DATA:  Right-sided low back pain for 4 months. EXAM: LUMBAR SPINE - COMPLETE 4+ VIEW COMPARISON:  Lumbar radiograph 08/20/2019 FINDINGS: Five non-rib-bearing lumbar vertebra. Similar minimal anterolisthesis of L3 on L4. Normal vertebral body heights. Diffuse degenerative disc disease with disc space narrowing and spurring, prominently affecting all 4 L5 and L5-S1. There is moderate diffuse facet hypertrophy. No evidence of fracture, focal bone lesion or bony destructive process. Right hip arthroplasty is partially included. IMPRESSION: 1. No acute radiographic findings. 2. Multilevel degenerative disc disease and facet hypertrophy, prominently affecting L4-5 and L5-S1. 3. Similar trace anterolisthesis of L3 on L4. Electronically Signed   By: Narda Rutherford M.D.   On: 12/20/2021 17:42    Procedures Procedures (including critical care time)  Medications Ordered in UC Medications - No data to display  Initial Impression / Assessment and Plan / UC Course  I have reviewed the triage vital signs and the nursing notes.  Pertinent labs & imaging results that were available during my care of the patient were reviewed by me and considered in my medical decision making (see chart for details).  Patient presents for complaints of right-sided low back pain/hip pain that has been present for the past several months.  On exam, patient has tenderness to the right lumbar spine.  There is no obvious deformity, swelling, or ecchymosis present in the right lower back or right hip.   He does have tenderness to these areas.  X-rays show multilevel degeneration of L4-L5 and L5-S1.  Symptoms appear to be chronic based on this x-ray.  Patient does not have pain that runs down his leg or symptoms of sciatica.  We will have patient follow-up with orthopedics given his chronic symptoms.  In the interim, patient was prescribed Tylenol 650 mg tablets for his pain.  Supportive care recommendations were provided to the patient.  Patient advised to follow-up in this clinic if his symptoms fail to improve.  Recommend following up with the emergency department if he becomes unable to walk, has numbness or tingling that causes weakness, or other concerns. Final Clinical Impressions(s) / UC Diagnoses   Final diagnoses:  Right-sided low back pain without sciatica, unspecified chronicity  Right hip pain     Discharge Instructions      Your x-rays are negative for fracture or dislocation.  The x-rays do show signs of degeneration in your lower back. Take medication as prescribed.   RICE therapy rest, ice, compression, and elevation until your symptoms improve.  Apply ice for 20 minutes, remove for 1 hour, then repeat as often as possible.  This will help with pain and swelling. As discussed, it is recommended that you follow-up with orthopedics regarding your pain.  It appears this is a chronic condition., recommend following up with orthopedics.  You can follow-up with [facility]  at [phone], [facility] in [place] at [phone], or with the [place].      ED Prescriptions     Medication Sig Dispense Auth. Provider   acetaminophen (TYLENOL 8 HOUR ARTHRITIS PAIN) 650 MG CR tablet Take 1 tablet (650 mg total) by mouth every 8 (eight) hours as needed for pain. 30 tablet Maram Bently-Warren, Sadie Haber, NP      PDMP not reviewed this encounter.   Abran Cantor, NP 12/20/21 1800

## 2021-12-20 NOTE — Discharge Instructions (Addendum)
Your x-rays are negative for fracture or dislocation.  The x-rays do show signs of degeneration in your lower back. Take medication as prescribed.   RICE therapy rest, ice, compression, and elevation until your symptoms improve.  Apply ice for 20 minutes, remove for 1 hour, then repeat as often as possible.  This will help with pain and swelling. As discussed, it is recommended that you follow-up with orthopedics regarding your pain.  It appears this is a chronic condition., recommend following up with orthopedics.  You can follow-up with [facility] [place] at [phone], [facility] in [place] at [phone], or with the [place].

## 2021-12-21 ENCOUNTER — Ambulatory Visit: Payer: No Typology Code available for payment source | Admitting: Urology

## 2022-03-10 ENCOUNTER — Encounter (HOSPITAL_COMMUNITY): Payer: Self-pay

## 2022-03-10 ENCOUNTER — Emergency Department (HOSPITAL_COMMUNITY)
Admission: EM | Admit: 2022-03-10 | Discharge: 2022-03-10 | Disposition: A | Discharge status: Home / Self Care | Payer: No Typology Code available for payment source | Attending: Emergency Medicine | Admitting: Emergency Medicine

## 2022-03-10 ENCOUNTER — Emergency Department (HOSPITAL_COMMUNITY): Payer: No Typology Code available for payment source

## 2022-03-10 ENCOUNTER — Other Ambulatory Visit: Payer: Self-pay

## 2022-03-10 DIAGNOSIS — I10 Essential (primary) hypertension: Secondary | ICD-10-CM | POA: Insufficient documentation

## 2022-03-10 DIAGNOSIS — Z7982 Long term (current) use of aspirin: Secondary | ICD-10-CM | POA: Insufficient documentation

## 2022-03-10 DIAGNOSIS — I2510 Atherosclerotic heart disease of native coronary artery without angina pectoris: Secondary | ICD-10-CM | POA: Insufficient documentation

## 2022-03-10 DIAGNOSIS — Z79899 Other long term (current) drug therapy: Secondary | ICD-10-CM | POA: Insufficient documentation

## 2022-03-10 DIAGNOSIS — M79672 Pain in left foot: Secondary | ICD-10-CM | POA: Diagnosis present

## 2022-03-10 DIAGNOSIS — M10072 Idiopathic gout, left ankle and foot: Secondary | ICD-10-CM | POA: Insufficient documentation

## 2022-03-10 HISTORY — DX: Gout, unspecified: M10.9

## 2022-03-10 MED ORDER — COLCHICINE 0.6 MG PO TABS: 0.6000 mg | ORAL_TABLET | Freq: Every day | ORAL | 0 refills | Status: DC

## 2022-03-10 MED ORDER — ALLOPURINOL 100 MG PO TABS: 300.0000 mg | ORAL_TABLET | Freq: Every day | ORAL | 0 refills | Status: DC

## 2022-03-10 MED ORDER — NAPROXEN 250 MG PO TABS
500.0000 mg | ORAL_TABLET | Freq: Once | ORAL | Status: AC
  Administered 2022-03-10: 500 mg via ORAL
  Filled 2022-03-10: qty 2

## 2022-03-10 NOTE — Discharge Instructions (Signed)
You were seen in the emergency department for foot pain.  As we discussed, I think your pain is likely related to a gout flare. I have given you a dose of naprosyn, which is an anti-inflammatory medicine you can continue at home (500 mg twice a day). This is a medication offered over the counter.  I'm sending you prescriptions for two medications. One of which, Colchicine, you can take 2 tablets, then an hour later take 1 tablet.   I'm refilling your Allopurinol for 30 days as well. Do not start this medicine until after this flare resolves.   Continue to monitor how you're doing and return to the ER for new or worsening symptoms.

## 2022-03-10 NOTE — ED Triage Notes (Signed)
Pt c/o L foot pain x2 days.  Pain score 10/10. Hx of gout.  Sts last flare up was 18 months ago.

## 2022-03-10 NOTE — ED Provider Notes (Addendum)
[facility] EMERGENCY DEPARTMENT Provider Note   CSN: [id number] Arrival date & time: 03/10/22  1215     History  Chief Complaint  Patient presents with   Foot Pain    Derrick Dean is a 81 y.o. male with history of hypertension, hepatitis C, GERD, gout, CAD s/p stent placed in 2015, who presents the emergency department complaining of left foot pain that he believes is a flare of gout.  He states that he stopped taking his prescribed allopurinol about 18 months ago, which is around the time that he last had a flare.  He ran out of the medication, and thought he did not need it anymore which is why he stopped taking it.  He states that this pain now in his left foot is in the same place that his previous flare was.  His pain started 2 days ago.  He tried taking some Tylenol last night with some relief.  States pain was increasingly worse overnight.  Denies any fevers, chills, recent illness.  Denies any injury to the foot.   Foot Pain       Home Medications Prior to Admission medications   Medication Sig Start Date End Date Taking? Authorizing Provider  allopurinol (ZYLOPRIM) 100 MG tablet Take 3 tablets (300 mg total) by mouth daily. Do not start until after resolution of active gout flare 03/10/22  Yes Archana Eckman T, PA-C  colchicine 0.6 MG tablet Take 1 tablet (0.6 mg total) by mouth daily. Take 2 tablets initially (1.2 mg), then 1 tablet one hour later. Can repeat 1 tablet up to three times. 03/10/22  Yes Pasty Manninen T, PA-C  acetaminophen (TYLENOL 8 HOUR ARTHRITIS PAIN) 650 MG CR tablet Take 1 tablet (650 mg total) by mouth every 8 (eight) hours as needed for pain. 12/20/21   Leath-Warren, Sadie Haber, NP  amLODipine (NORVASC) 10 MG tablet Take 5 mg by mouth daily.    [provider]  aspirin EC 81 MG tablet Take 81 mg by mouth daily.    [provider]  capsicum (ZOSTRIX) 0.075 % topical cream Apply 1 application topically 2 (two) times daily as  needed. For pain    [provider]  cetirizine (ZYRTEC) 10 MG tablet Take 1 tablet (10 mg total) by mouth daily. 09/26/13   Regalado, Belkys A, MD  cholecalciferol (VITAMIN D) 1000 UNITS tablet Take 1,000 Units by mouth daily.    [provider]  cyclobenzaprine (FLEXERIL) 5 MG tablet Take 1 tablet (5 mg total) by mouth at bedtime as needed for muscle spasms. Do not drink alcohol or drive while taking this medication.  May cause drowsiness. 09/28/21   Particia Nearing, PA-C  gabapentin (NEURONTIN) 400 MG capsule Take 400 mg by mouth 3 (three) times daily.    [provider]  HYDROcodone-acetaminophen (NORCO/VICODIN) 5-325 MG per tablet Take 1 tablet by mouth every 4 (four) hours as needed. 11/20/13   Rolland Porter, MD  methocarbamol (ROBAXIN) 500 MG tablet Take 1 tablet (500 mg total) by mouth 2 (two) times daily. 04/10/20   Terrilee Files, MD  mirtazapine (REMERON) 15 MG tablet Take 15 mg by mouth at bedtime.    [provider]  naproxen (NAPROSYN) 500 MG tablet Take 1 tablet (500 mg total) by mouth 2 (two) times daily. 06/24/21   Honor Loh M, PA-C  omeprazole (PRILOSEC) 20 MG capsule Take 20 mg by mouth daily.    [provider]  omeprazole (PRILOSEC) 20 MG capsule  Take 1 capsule (20 mg total) by mouth daily. 11/04/14   Palumbo, April, MD  polyethylene glycol powder (MIRALAX) powder Take 17 g by mouth daily. 11/04/14   Palumbo, April, MD  pravastatin (PRAVACHOL) 80 MG tablet Take 40 mg by mouth at bedtime.    [provider]  prazosin (MINIPRESS) 1 MG capsule Take 1 mg by mouth at bedtime.    [provider]  tamsulosin (FLOMAX) 0.4 MG CAPS capsule Take 1 capsule (0.4 mg total) by mouth daily. 11/20/13   Rolland Porter, MD  vitamin B-12 (CYANOCOBALAMIN) 1000 MCG tablet Take 1,000 mcg by mouth daily.    [provider]      Allergies    Lisinopril    Review of Systems   Review of Systems  Constitutional:  Negative for  chills and fever.  Musculoskeletal:  Positive for arthralgias.  All other systems reviewed and are negative.   Physical Exam Updated Vital Signs BP (!) 165/75 (BP Location: Right Arm)   Pulse 64   Temp 98 F (36.7 C) (Oral)   Resp 16   Ht 5\' 11"  (1.803 m)   Wt 74.8 kg   SpO2 100%   BMI 23.01 kg/m  Physical Exam Vitals and nursing note reviewed.  Constitutional:      Appearance: Normal appearance.  HENT:     Head: Normocephalic and atraumatic.  Eyes:     Conjunctiva/sclera: Conjunctivae normal.  Pulmonary:     Effort: Pulmonary effort is normal. No respiratory distress.  Feet:     Comments: Tenderness to palpation over the fourth MTP of the left foot.  Mild overlying erythema, without break in the skin.  Normal ROM of all digits and ankle.  No open wounds. Skin:    General: Skin is warm and dry.  Neurological:     Mental Status: He is alert.  Psychiatric:        Mood and Affect: Mood normal.        Behavior: Behavior normal.     ED Results / Procedures / Treatments   Labs (all labs ordered are listed, but only abnormal results are displayed) Labs Reviewed - No data to display  EKG None  Radiology DG Foot Complete Left  Result Date: 03/10/2022 CLINICAL DATA:  Left foot pain for 2 days. EXAM: LEFT FOOT - COMPLETE 3+ VIEW COMPARISON:  None Available. FINDINGS: No evidence for an acute fracture. No subluxation or dislocation. No worrisome lytic or sclerotic osseous abnormality. Bones are mildly demineralized. IMPRESSION: No acute bony findings. Electronically Signed   By: 03/12/2022 M.D.   On: 03/10/2022 13:22    Procedures Procedures    Medications Ordered in ED Medications  naproxen (NAPROSYN) tablet 500 mg (has no administration in time range)    ED Course/ Medical Decision Making/ A&P                           Medical Decision Making Amount and/or Complexity of Data Reviewed Radiology: ordered.  Risk Prescription drug management.   This  patient is a 81 y.o. male who presents to the ED for concern of foot pain.   Differential diagnoses prior to evaluation: Gout, septic arthritis, diabetic ulcer, fracture/dislocation, ligamentous injury  Past Medical History / Social History / Additional history: Chart reviewed. Pertinent results include: hypertension, hepatitis C, GERD, gout, CAD s/p stent placed in 2015  Physical Exam: Physical exam performed. The pertinent findings include: Hypertensive, otherwise normal vital signs.  Tenderness palpation of the left fourth MTP with some mild overlying erythema.  Imaging: I personally reviewed and interpreted x-ray of the left foot which shows no acute findings.  Medications / Treatment: Given dose of naproxen   Disposition: After consideration of the diagnostic results and the patients response to treatment, I feel that emergency department workup does not suggest an emergent condition requiring admission or immediate intervention beyond what has been performed at this time. The plan is: Discharge home with symptomatic management of likely acute gout flare.  Plan to prescribe colchicine and refill patient's allopurinol prescription to take once flare has resolved. Low concern for septic arthritis as patient is otherwise clinically well-appearing, with no recent illnesses.  There is no increased warmth of the joint or signs of infection.  The patient is safe for discharge and has been instructed to return immediately for worsening symptoms, change in symptoms or any other concerns.  Final Clinical Impression(s) / ED Diagnoses Final diagnoses:  Acute idiopathic gout of left foot  Foot pain, left    Rx / DC Orders ED Discharge Orders          Ordered    colchicine 0.6 MG tablet  Daily        03/10/22 1545    allopurinol (ZYLOPRIM) 100 MG tablet  Daily        03/10/22 1545           Portions of this report may have been transcribed using voice recognition software. Every effort  was made to ensure accuracy; however, inadvertent computerized transcription errors may be present.    Jeanella Flattery 03/10/22 1603    Eber Hong, MD 03/11/22 1028

## 2022-04-25 ENCOUNTER — Ambulatory Visit: Payer: Self-pay

## 2022-05-01 ENCOUNTER — Ambulatory Visit
Admission: RE | Admit: 2022-05-01 | Discharge: 2022-05-01 | Disposition: A | Discharge status: Home / Self Care | Payer: No Typology Code available for payment source | Source: Ambulatory Visit | Attending: Nurse Practitioner | Admitting: Nurse Practitioner

## 2022-05-01 ENCOUNTER — Ambulatory Visit: Payer: No Typology Code available for payment source

## 2022-05-01 VITALS — BP 122/61 | HR 67 | Temp 98.0°F | Resp 18

## 2022-05-01 DIAGNOSIS — Z1152 Encounter for screening for COVID-19: Secondary | ICD-10-CM | POA: Insufficient documentation

## 2022-05-01 DIAGNOSIS — M25551 Pain in right hip: Secondary | ICD-10-CM | POA: Diagnosis present

## 2022-05-01 NOTE — ED Triage Notes (Signed)
Pt reports hip and right shoulder pain "for year". Pain is worse this week.

## 2022-05-01 NOTE — ED Provider Notes (Signed)
[facility]    CSN: [id number] Arrival date & time: 05/01/22  1352      History   Chief Complaint Chief Complaint  Patient presents with   Appointment    1400   Shoulder Pain    HPI Derrick Dean is a 82 y.o. male.   Patient is here for chronic right hip and knee pain.  He denies any accident or trauma to the hip.  He has been able to bear weight on his right hip.  Reports he is here today for x-rays.  He has a follow-up scheduled with his primary care provider tomorrow.  Reports he falls "all the time."  He was doing physical therapy but does not anymore.  Denies any swelling, bruising, or redness to the hip.  Again, reports he can walk fine.  His pain today is a 3 out of 10.  He is adamant on having x-rays today.  He is also requesting screening for COVID-19 today.  He denies any symptoms including no fever, cough, congestion, shortness of breath, chest pain, abdominal pain, nausea/vomiting, diarrhea, decreased appetite.  Reports he refuses to take the COVID-19 shots and therefore gets checked periodically for it.    Past Medical History:  Diagnosis Date   Anemia    Appendicitis    Arthritis    B12 nutritional deficiency    on supplement   GERD (gastroesophageal reflux disease)    Gout    Hepatitis C infection    not treated - pending appointment in april 2015   History of heart artery stent    Hypertension    Presbyopia    wears bifocals   Trouble in sleeping     Patient Active Problem List   Diagnosis Date Noted   Other abnormalities of gait and mobility 01/03/2021   Gout 01/03/2021   Posttraumatic stress disorder 01/03/2021   Gastroesophageal reflux disease 01/03/2021   Hyperlipidemia 01/03/2021   Vitamin B12 deficiency 01/03/2021   Shoulder pain 01/03/2021   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   S/P ORIF (open reduction internal fixation) fracture 05/05/2016   Closed fracture of right distal radius 04/18/2016   Common cold 09/25/2013   CAD S/P percutaneous coronary angioplasty 09/25/2013   HCV (hepatitis C virus) 05/15/2013   Cholelithiasis 05/15/2013   Abdominal pain, epigastric 05/15/2013   Pancreatitis, acute 05/13/2013   Unspecified essential hypertension 05/13/2013   Transaminasemia 05/13/2013   Chest pain 05/12/2013    Past Surgical History:  Procedure Laterality Date   APPENDECTOMY     CARDIAC CATHETERIZATION     7 YRS AGO W/STENT   Left shoulder surgery     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 Authorizing Provider  acetaminophen (TYLENOL 8 HOUR ARTHRITIS PAIN) 650 MG CR tablet Take 1 tablet (650 mg total) by mouth every 8 (eight) hours as needed for pain. 12/20/21   Leath-Warren, Alda Lea, NP  allopurinol (ZYLOPRIM) 100 MG tablet Take 3 tablets (300 mg total) by mouth daily. Do not start until  after resolution of active gout flare 03/10/22   Roemhildt, Lorin T, PA-C  amLODipine (NORVASC) 10 MG tablet Take 5 mg by mouth daily.    [provider]  aspirin EC 81 MG tablet Take 81 mg by mouth daily.    [provider]  capsicum (ZOSTRIX) 0.075 % topical cream Apply 1 application topically 2 (two) times daily as needed. For pain    [provider]  cetirizine (ZYRTEC) 10 MG tablet Take 1 tablet (10 mg total) by mouth daily. 09/26/13   Regalado, Belkys A, MD  cholecalciferol (VITAMIN D) 1000 UNITS tablet Take 1,000 Units by mouth daily.    [provider]  colchicine 0.6 MG tablet Take 1 tablet (0.6 mg total) by  mouth daily. Take 2 tablets initially (1.2 mg), then 1 tablet one hour later. Can repeat 1 tablet up to three times. 03/10/22   Roemhildt, Lorin T, PA-C  cyclobenzaprine (FLEXERIL) 5 MG tablet Take 1 tablet (5 mg total) by mouth at bedtime as needed for muscle spasms. Do not drink alcohol or drive while taking this medication.  May cause drowsiness. 09/28/21   Volney American, PA-C  gabapentin (NEURONTIN) 400 MG capsule Take 400 mg by mouth 3 (three) times daily.    [provider]  HYDROcodone-acetaminophen (NORCO/VICODIN) 5-325 MG per tablet Take 1 tablet by mouth every 4 (four) hours as needed. 11/20/13   Tanna Furry, MD  methocarbamol (ROBAXIN) 500 MG tablet Take 1 tablet (500 mg total) by mouth 2 (two) times daily. 04/10/20   Hayden Rasmussen, MD  mirtazapine (REMERON) 15 MG tablet Take 15 mg by mouth at bedtime.    [provider]  naproxen (NAPROSYN) 500 MG tablet Take 1 tablet (500 mg total) by mouth 2 (two) times daily. 06/24/21   Myna Bright M, PA-C  omeprazole (PRILOSEC) 20 MG capsule Take 20 mg by mouth daily.    [provider]  omeprazole (PRILOSEC) 20 MG capsule Take 1 capsule (20 mg total) by mouth daily. 11/04/14   Palumbo, April, MD  polyethylene glycol powder (MIRALAX) powder Take 17 g by mouth daily. 11/04/14   Palumbo, April, MD  pravastatin (PRAVACHOL) 80 MG tablet Take 40 mg by mouth at bedtime.    [provider]  prazosin (MINIPRESS) 1 MG capsule Take 1 mg by mouth at bedtime.    [provider]  tamsulosin (FLOMAX) 0.4 MG CAPS capsule Take 1 capsule (0.4 mg total) by mouth daily. 11/20/13   Tanna Furry, MD  vitamin B-12 (CYANOCOBALAMIN) 1000 MCG tablet Take 1,000 mcg by mouth daily.    [provider]    Family History Family History  Problem Relation Age of Onset   Coronary artery disease Mother    Thyroid disease Mother    Heart disease Mother    Coronary artery disease Father    Heart disease Father      Social History Social History   Tobacco Use   Smoking status: Former    Types: Cigarettes    Quit date: 09/14/2001    Years since quitting: 20.6   Smokeless tobacco: Never  Vaping Use   Vaping Use: Never used  Substance Use Topics   Alcohol use: Yes    Alcohol/week: 1.0 standard drink of alcohol    Types: 1 Cans of beer per week    Comment: 3x week   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 --      Excl. in GC? --    No data found.  Updated Vital Signs BP 122/61 (BP Location: Right Arm)   Pulse 67   Temp 98 F (36.7 C) (Oral)   Resp 18   SpO2 95%   Visual Acuity Right Eye Distance:   Left Eye Distance:   Bilateral Distance:    Right Eye Near:   Left Eye Near:    Bilateral Near:     Physical Exam Vitals and nursing note reviewed.  Constitutional:      General: He is not in acute distress.    Appearance: Normal appearance. He is not toxic-appearing.  Cardiovascular:     Rate and Rhythm: Normal rate and regular rhythm.  Pulmonary:     Effort: Pulmonary effort is normal. No respiratory distress.     Breath sounds: Normal breath sounds. No wheezing, rhonchi or rales.  Musculoskeletal:     Right hip: Normal. No tenderness or bony tenderness. Normal strength.     Left hip: Normal. No tenderness or bony tenderness. Normal strength.     Right lower leg: Normal. No tenderness or bony tenderness.     Left lower leg: Normal. No tenderness or bony  tenderness.     Comments: No deformity, bruising, redness, or swelling to the right hip.  Strength and sensation equal bilaterally.  Skin:    General: Skin is warm and dry.     Capillary Refill: Capillary refill takes less than 2 seconds.     Coloration: Skin is not jaundiced or pale.     Findings: No erythema.  Neurological:     Mental Status: He is alert and oriented to person, place, and time.  Psychiatric:        Behavior: Behavior is cooperative.      UC Treatments / Results  Labs (all labs ordered are listed, but only abnormal results are displayed) Labs Reviewed  SARS CORONAVIRUS 2 (TAT 6-24 HRS)    EKG   Radiology DG Hip Unilat With Pelvis 2-3 Views Right  Result Date: 05/01/2022 CLINICAL DATA:  221993 Right hip pain 221993 EXAM: DG HIP (WITH OR WITHOUT PELVIS) 2-3V RIGHT COMPARISON:  09/28/2021 FINDINGS: Right hip arthroplasty components project in expected location without surrounding lucency. No fracture or dislocation. Bony pelvis intact. Spondylitic changes in the lower lumbar spine as before. Penile prosthesis components stable. Patchy iliofemoral arterial calcifications. IMPRESSION: Right hip arthroplasty without fracture or other acute finding. Electronically Signed   By: Corlis Leak M.D.   On: 05/01/2022 14:52    Procedures Procedures (including critical care time)  Medications Ordered in UC Medications - No data to display  Initial Impression / Assessment and Plan / UC Course  I have  reviewed the triage vital signs and the nursing notes.  Pertinent labs & imaging results that were available during my care of the patient were reviewed by me and considered in my medical decision making (see chart for details).   Patient is well-appearing, normotensive, afebrile, not tachycardic, not tachypneic, oxygenating well on room air.    Pain of right hip X-ray imaging today is negative for acute bony abnormality Recommended close follow-up with primary care provider  with improvement or worsening symptoms despite treatment  Encounter for screening for COVID-19 Screening obtained for rule out He is asymptomatic today  The patient was given the opportunity to ask questions.  All questions answered to their satisfaction.  The patient is in agreement to this plan.   Final Clinical Impressions(s) / UC Diagnoses   Final diagnoses:  Pain of right hip  Encounter for screening for COVID-19     Discharge Instructions      The x-ray today does not show any broken bones in your hip.  Please follow up with your PCP as scheduled. Continue Tylenol as needed for pain.  We will call you if the COVID-19 test is positive tomorrow.     ED Prescriptions   None    PDMP not reviewed this encounter.   Valentino Nose, NP 05/01/22 1704

## 2022-05-01 NOTE — Discharge Instructions (Addendum)
The x-ray today does not show any broken bones in your hip.  Please follow up with your PCP as scheduled. Continue Tylenol as needed for pain.  We will call you if the COVID-19 test is positive tomorrow.

## 2022-05-01 NOTE — ED Notes (Signed)
Pt requested COVID test.

## 2022-05-02 LAB — SARS CORONAVIRUS 2 (TAT 6-24 HRS): SARS Coronavirus 2: NEGATIVE

## 2022-06-26 ENCOUNTER — Ambulatory Visit (INDEPENDENT_AMBULATORY_CARE_PROVIDER_SITE_OTHER): Payer: 59 | Admitting: Orthopedic Surgery

## 2022-06-26 ENCOUNTER — Telehealth: Payer: Self-pay

## 2022-06-26 ENCOUNTER — Encounter: Payer: Self-pay | Admitting: Orthopedic Surgery

## 2022-06-26 VITALS — BP 152/91 | HR 79 | Ht 71.0 in | Wt 166.0 lb

## 2022-06-26 DIAGNOSIS — M48061 Spinal stenosis, lumbar region without neurogenic claudication: Secondary | ICD-10-CM | POA: Insufficient documentation

## 2022-06-26 NOTE — Telephone Encounter (Signed)
No call primary care I can only give medication if he has surgery

## 2022-06-26 NOTE — Progress Notes (Addendum)
Chief Complaint  Patient presents with   Hip Pain    Here for second opinion, history of hip surgery right 5 years ago [place] also had total knee replacement on Right last year at the [place] in [place] states neither surgery went well and can't walk well / states has never had a successful surgery and can't get any worse   Patient ID: Derrick Dean, male   DOB: [birth year], 82 y.o.   MRN: [id number]    ASSESSMENT AND PLAN:  Encounter Diagnosis  Name Primary?   Degenerative lumbar spinal stenosis Yes     Chief Complaint  Patient presents with   Hip Pain    Here for second opinion, history of hip surgery right 5 years ago [name] also had total knee replacement on Right last year at the [place] in [place] states neither surgery went well and can't walk well / states has never had a successful surgery and can't get any worse    HPI Derrick Dean is a 82 y.o. male.  Status post right total knee left total knee right total hip presents with right "hip" pain which is actually in his right lower back with some upper lateral thigh radiation and he has frequent giving way episodes of the right leg.  He uses a shopping cart to get through his shopping.  He has some difficulty getting out of a chair and getting in and out of a car  Allergies  Allergen Reactions   Lisinopril     Made me feel funny   Current Outpatient Medications  Medication Instructions   acetaminophen (TYLENOL 8 HOUR ARTHRITIS PAIN) 650 mg, Oral, Every 8 hours PRN   allopurinol (ZYLOPRIM) 300 mg, Oral, Daily, Do not start until after resolution of active gout flare   amLODipine (NORVASC) 5 mg, Oral, Daily   aspirin EC 81 mg, Daily   capsicum (ZOSTRIX) 0.075 % topical cream 1 application , 2 times daily PRN   cetirizine (ZYRTEC) 10 mg, Oral, Daily   cholecalciferol (VITAMIN D) 1,000 Units, Oral, Daily   colchicine 0.6 mg, Oral, Daily, Take 2 tablets initially (1.2 mg), then 1 tablet one hour later. Can  repeat 1 tablet up to three times.   cyanocobalamin (VITAMIN B12) 1,000 mcg, Daily   cyclobenzaprine (FLEXERIL) 5 mg, Oral, At bedtime PRN, Do not drink alcohol or drive while taking this medication.  May cause drowsiness.   gabapentin (NEURONTIN) 400 mg, 3 times daily   HYDROcodone-acetaminophen (NORCO/VICODIN) 5-325 MG per tablet 1 tablet, Oral, Every 4 hours PRN   methocarbamol (ROBAXIN) 500 mg, Oral, 2 times daily   mirtazapine (REMERON) 15 mg, Daily at bedtime   naproxen (NAPROSYN) 500 mg, Oral, 2 times daily   omeprazole (PRILOSEC) 20 mg, Daily   omeprazole (PRILOSEC) 20 mg, Oral, Daily   polyethylene glycol powder (MIRALAX) 17 g, Oral, Daily   pravastatin (PRAVACHOL) 40 mg, Oral, Daily at bedtime   prazosin (MINIPRESS) 1 mg, Oral, Daily at bedtime   tamsulosin (FLOMAX) 0.4 mg, Oral, Daily   traZODone (DESYREL) 50 mg, Oral, Daily at bedtime    Review of Systems Review of Systems  Past Medical History:  Diagnosis Date   Anemia    Appendicitis    Arthritis    B12 nutritional deficiency    on supplement   GERD (gastroesophageal reflux disease)    Gout    Hepatitis C infection    not treated - pending appointment in april 2015   History  of heart artery stent    Hypertension    Presbyopia    wears bifocals   Trouble in sleeping     Past Surgical History:  Procedure Laterality Date   APPENDECTOMY     CARDIAC CATHETERIZATION     7 YRS AGO W/STENT   Left shoulder surgery     Right Hip Replacement Right 01/27/2020   Right Shoulder surgery     TOTAL KNEE ARTHROPLASTY Bilateral     Family History  Problem Relation Age of Onset   Coronary artery disease Mother    Thyroid disease Mother    Heart disease Mother    Coronary artery disease Father    Heart disease Father     Social History Social History   Tobacco Use   Smoking status: Former    Types: Cigarettes    Quit date: 09/14/2001    Years since quitting: 20.7   Smokeless tobacco: Never  Vaping Use    Vaping Use: Never used  Substance Use Topics   Alcohol use: Yes    Alcohol/week: 1.0 standard drink of alcohol    Types: 1 Cans of beer per week    Comment: 3x week   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 30 tablet 0   allopurinol (ZYLOPRIM) 100 MG tablet Take 3 tablets (300 mg total) by mouth daily. Do not start until after resolution of active gout flare 30 tablet 0   amLODipine (NORVASC) 10 MG tablet Take 5 mg by mouth daily.     aspirin EC 81 MG tablet Take 81 mg by mouth daily.     capsicum (ZOSTRIX) 0.075 % topical cream Apply 1 application topically 2 (two) times daily as needed. For pain     cetirizine (ZYRTEC) 10 MG tablet Take 1 tablet (10 mg total) by mouth daily. 5 tablet 0   cholecalciferol (VITAMIN D) 1000 UNITS tablet Take 1,000 Units by mouth daily.     colchicine 0.6 MG tablet Take 1 tablet (0.6 mg total) by mouth daily. Take 2 tablets initially (1.2 mg), then 1 tablet one hour later. Can repeat 1 tablet up to three times. 5 tablet 0   naproxen (NAPROSYN) 500 MG tablet Take 1 tablet (500 mg total) by mouth 2 (two) times daily. 30 tablet 0   omeprazole (PRILOSEC) 20 MG capsule Take 20 mg by mouth daily.     omeprazole (PRILOSEC) 20 MG capsule Take 1 capsule (20 mg total) by mouth daily. 30 capsule 0   polyethylene glycol powder (MIRALAX) powder Take 17 g by mouth daily. 255 g 0   pravastatin (PRAVACHOL) 80 MG tablet Take 40 mg by mouth at bedtime.     prazosin (MINIPRESS) 1 MG capsule Take 1 mg by mouth at bedtime.     traZODone (DESYREL) 50 MG tablet Take 50 mg by mouth at bedtime.     vitamin B-12 (CYANOCOBALAMIN) 1000 MCG tablet Take 1,000 mcg by  mouth daily.     cyclobenzaprine (FLEXERIL) 5 MG tablet Take 1 tablet (5 mg total) by mouth at bedtime as needed for muscle spasms. Do not drink alcohol or  drive while taking this medication.  May cause drowsiness. (Patient not taking: Reported on 06/26/2022) 10 tablet 0   gabapentin (NEURONTIN) 400 MG capsule Take 400 mg by mouth 3 (three) times daily. (Patient not taking: Reported on 06/26/2022)     HYDROcodone-acetaminophen (NORCO/VICODIN) 5-325 MG per tablet Take 1 tablet by mouth every 4 (four) hours as needed. (Patient not taking: Reported on 06/26/2022) 10 tablet 0   methocarbamol (ROBAXIN) 500 MG tablet Take 1 tablet (500 mg total) by mouth 2 (two) times daily. (Patient not taking: Reported on 06/26/2022) 20 tablet 0   mirtazapine (REMERON) 15 MG tablet Take 15 mg by mouth at bedtime. (Patient not taking: Reported on 06/26/2022)     tamsulosin (FLOMAX) 0.4 MG CAPS capsule Take 1 capsule (0.4 mg total) by mouth daily. (Patient not taking: Reported on 06/26/2022) 7 capsule 0   No current facility-administered medications for this visit.       Physical Exam BP (!) 152/91   Pulse 79   Ht '5\' 11"'$  (1.803 m)   Wt 166 lb (75.3 kg)   BMI 23.15 kg/m   Gen. appearance: The patient is well-developed and well-nourished grooming and hygiene are normal The patient is oriented to person place and time The patient's mood is normal and the affect is normal   Gait assessment: The patient stands with bilateral flexion contractures of the knee  Ab- normal gait and station  Lumbar spine Tenderness  to palpation is noted in the lower L4-5 and right lower lumbar area  range of motion decreased flexion extension pain on extension Muscle tone  ab - normal on the right  Lower extremities right and left Normal range of motion hip  All 3 joints are reduced and stable  Strength right lower extremity L2-S1 NORMAL  Strength left lower extremity L2-S1 NORMAL  Neurologic right lower extremity examination   Reflexes were1+ and equal at the knee and 1+ and equal at the ankle    Sensation was normal in both feet and legs    Babinski's tests were down going  Straight leg raise testing   The vascular examination revealed normal dorsalis pedis pulses in both feet and both feet were warm with good capillary refill    MEDICAL DECISION MAKING  A.  Encounter Diagnosis  Name Primary?   Degenerative lumbar spinal stenosis Yes    B. DATA ANALYSED:  There is a note in the chart from the [facility] clinic it is a general medical update  IMAGING: Independent interpretation of images: May 01, 2022 hip film December 20, 2021 hip and spine film  Hip film shows a well-placed right total hip spine films show severe degenerative arthritis spondylosis most likely has spinal stenosis  Orders: PT  Outside records reviewed: Yes  C. MANAGEMENT physical therapy return in 6 weeks if no improvement referral will be made to neurosurgery or orthospine  No orders of the defined types were placed in this encounter.  FINDINGS: Right hip arthroplasty components project in expected location without surrounding lucency. No fracture or dislocation. Bony pelvis intact. Spondylitic changes in the lower lumbar spine as before. Penile prosthesis components stable. Patchy iliofemoral arterial calcifications.   IMPRESSION: Right hip arthroplasty without fracture or other acute finding.     Electronically Signed   By: Lucrezia Europe M.D.   On: 05/01/2022 14:52  FINDINGS: Five non-rib-bearing lumbar vertebra. Similar minimal anterolisthesis of L3 on L4. Normal vertebral body heights. Diffuse degenerative disc disease with disc space narrowing and spurring, prominently  affecting all 4 L5 and L5-S1. There is moderate diffuse facet hypertrophy. No evidence of fracture, focal bone lesion or bony destructive process. Right hip arthroplasty is partially included.   IMPRESSION: 1. No acute radiographic  findings. 2. Multilevel degenerative disc disease and facet hypertrophy, prominently affecting L4-5 and L5-S1. 3. Similar trace anterolisthesis of L3 on L4.     Electronically Signed   By: Keith Rake M.D.   On: 12/20/2021 17:42

## 2022-06-26 NOTE — Addendum Note (Signed)
Addended byCandice Camp on: 06/26/2022 01:49 PM   Modules accepted: Orders

## 2022-06-26 NOTE — Telephone Encounter (Signed)
I called him to advise. Left message for him to call me back.

## 2022-06-26 NOTE — Patient Instructions (Signed)
Physical therapy has been ordered for you at [facility] [place]. They should call you to schedule, [phone] is the phone number to call, if you want to call to schedule.   

## 2022-06-26 NOTE — Telephone Encounter (Signed)
Patient said he forgot to ask Dr. Aline Brochure if he would give him something for pain.  PATIENT USES [facility]

## 2022-06-27 NOTE — Telephone Encounter (Signed)
I called him again to advise no pain meds from Dr Aline Brochure phone cut out I called back and it sounded like someone said " Derrick Dean speaking" I asked for [name] and the line went dead again, so he is hanging up the phone

## 2022-07-07 ENCOUNTER — Ambulatory Visit (HOSPITAL_COMMUNITY): Payer: 59

## 2022-08-01 ENCOUNTER — Emergency Department (HOSPITAL_COMMUNITY)
Admission: EM | Admit: 2022-08-01 | Discharge: 2022-08-01 | Disposition: A | Discharge status: Home / Self Care | Payer: No Typology Code available for payment source | Attending: Emergency Medicine | Admitting: Emergency Medicine

## 2022-08-01 ENCOUNTER — Encounter (HOSPITAL_COMMUNITY): Payer: Self-pay | Admitting: *Deleted

## 2022-08-01 ENCOUNTER — Emergency Department (HOSPITAL_COMMUNITY): Payer: No Typology Code available for payment source

## 2022-08-01 ENCOUNTER — Other Ambulatory Visit: Payer: Self-pay

## 2022-08-01 DIAGNOSIS — U071 COVID-19: Secondary | ICD-10-CM | POA: Diagnosis not present

## 2022-08-01 DIAGNOSIS — Z7982 Long term (current) use of aspirin: Secondary | ICD-10-CM | POA: Insufficient documentation

## 2022-08-01 DIAGNOSIS — Z79899 Other long term (current) drug therapy: Secondary | ICD-10-CM | POA: Diagnosis not present

## 2022-08-01 DIAGNOSIS — I10 Essential (primary) hypertension: Secondary | ICD-10-CM | POA: Diagnosis not present

## 2022-08-01 DIAGNOSIS — R059 Cough, unspecified: Secondary | ICD-10-CM | POA: Diagnosis present

## 2022-08-01 LAB — BASIC METABOLIC PANEL
Anion gap: 5 (ref 5–15)
BUN: 7 mg/dL — ABNORMAL LOW (ref 8–23)
CO2: 26 mmol/L (ref 22–32)
Calcium: 8.4 mg/dL — ABNORMAL LOW (ref 8.9–10.3)
Chloride: 105 mmol/L (ref 98–111)
Creatinine, Ser: 0.86 mg/dL (ref 0.61–1.24)
GFR, Estimated: >60 mL/min (ref >60)
Glucose, Bld: 95 mg/dL (ref 70–99)
Potassium: 3.9 mmol/L (ref 3.5–5.1)
Sodium: 136 mmol/L (ref 135–145)

## 2022-08-01 LAB — SARS CORONAVIRUS 2 BY RT PCR: SARS Coronavirus 2 by RT PCR: POSITIVE — AB

## 2022-08-01 NOTE — ED Notes (Signed)
Pt given a sandwich tray ?

## 2022-08-01 NOTE — ED Provider Notes (Cosign Needed)
[place] EMERGENCY DEPARTMENT AT [facility] Provider Note   CSN: [id number] Arrival date & time: 08/01/22  1403     History  Chief Complaint  Patient presents with   Cough    Darryn A Terpak is a 82 y.o. male history of hypertension, hep C, status post coronary angioplasty, pulmonary fibrosis presented with 2 weeks of a productive cough.  Patient was recently [place] for 18 days and has since developed a productive cough with green and yellow sputum.  Patient states he has also lost 20 pounds over his time in [place] till now and that he has had decreased appetite but is still taking in food and fluids orally. Patient states he lost this weight due to be vegetarian and while over there was unable to eat meals to fit his diet. Due to the decreased appetite patient endorses fatigue.  Patient denies any sick contacts or leg swelling, hemoptysis, history of cancer, recent surgeries or hospitalizations, previous blood clots.  Patient denies chest pain, nausea/vomiting, abdominal pain, dysuria, change in sensation/motor skills  Home Medications Prior to Admission medications   Medication Sig Start Date End Date Taking? Authorizing Provider  acetaminophen (TYLENOL 8 HOUR ARTHRITIS PAIN) 650 MG CR tablet Take 1 tablet (650 mg total) by mouth every 8 (eight) hours as needed for pain. 12/20/21   Leath-Warren, Sadie Haber, NP  allopurinol (ZYLOPRIM) 100 MG tablet Take 3 tablets (300 mg total) by mouth daily. Do not start until after resolution of active gout flare 03/10/22   Roemhildt, Lorin T, PA-C  amLODipine (NORVASC) 10 MG tablet Take 5 mg by mouth daily.    [provider]  aspirin EC 81 MG tablet Take 81 mg by mouth daily.    [provider]  capsicum (ZOSTRIX) 0.075 % topical cream Apply 1 application topically 2 (two) times daily as needed. For pain    [provider]  cetirizine (ZYRTEC) 10 MG tablet Take 1 tablet (10 mg total) by mouth daily.  09/26/13   Regalado, Belkys A, MD  cholecalciferol (VITAMIN D) 1000 UNITS tablet Take 1,000 Units by mouth daily.    [provider]  colchicine 0.6 MG tablet Take 1 tablet (0.6 mg total) by mouth daily. Take 2 tablets initially (1.2 mg), then 1 tablet one hour later. Can repeat 1 tablet up to three times. 03/10/22   Roemhildt, Lorin T, PA-C  cyclobenzaprine (FLEXERIL) 5 MG tablet Take 1 tablet (5 mg total) by mouth at bedtime as needed for muscle spasms. Do not drink alcohol or drive while taking this medication.  May cause drowsiness. Patient not taking: Reported on 06/26/2022 09/28/21   Particia Nearing, PA-C  gabapentin (NEURONTIN) 400 MG capsule Take 400 mg by mouth 3 (three) times daily. Patient not taking: Reported on 06/26/2022    [provider]  HYDROcodone-acetaminophen (NORCO/VICODIN) 5-325 MG per tablet Take 1 tablet by mouth every 4 (four) hours as needed. Patient not taking: Reported on 06/26/2022 11/20/13   Rolland Porter, MD  methocarbamol (ROBAXIN) 500 MG tablet Take 1 tablet (500 mg total) by mouth 2 (two) times daily. Patient not taking: Reported on 06/26/2022 04/10/20   Terrilee Files, MD  mirtazapine (REMERON) 15 MG tablet Take 15 mg by mouth at bedtime. Patient not taking: Reported on 06/26/2022    [provider]  naproxen (NAPROSYN) 500 MG tablet Take 1 tablet (500 mg total) by mouth 2 (two) times daily. 06/24/21   Honor Loh M, PA-C  omeprazole (  PRILOSEC) 20 MG capsule Take 20 mg by mouth daily.    [provider]  omeprazole (PRILOSEC) 20 MG capsule Take 1 capsule (20 mg total) by mouth daily. 11/04/14   Palumbo, April, MD  polyethylene glycol powder (MIRALAX) powder Take 17 g by mouth daily. 11/04/14   Palumbo, April, MD  pravastatin (PRAVACHOL) 80 MG tablet Take 40 mg by mouth at bedtime.    [provider]  prazosin (MINIPRESS) 1 MG capsule Take 1 mg by mouth at bedtime.    [provider]  tamsulosin (FLOMAX) 0.4  MG CAPS capsule Take 1 capsule (0.4 mg total) by mouth daily. Patient not taking: Reported on 06/26/2022 11/20/13   Rolland Porter, MD  traZODone (DESYREL) 50 MG tablet Take 50 mg by mouth at bedtime. 06/12/22   [provider]  vitamin B-12 (CYANOCOBALAMIN) 1000 MCG tablet Take 1,000 mcg by mouth daily.    [provider]      Allergies    Lisinopril    Review of Systems   Review of Systems  Respiratory:  Positive for cough.   See HPI  Physical Exam Updated Vital Signs BP (!) 186/69 (BP Location: Right Arm)   Pulse 69   Temp 99 F (37.2 C) (Tympanic)   Resp 18   Ht  (1.778 m)   Wt 70.3 kg   SpO2 95%   BMI 22.24 kg/m  Physical Exam Vitals reviewed.  Constitutional:      General: He is not in acute distress. HENT:     Head: Normocephalic and atraumatic.  Eyes:     Extraocular Movements: Extraocular movements intact.     Conjunctiva/sclera: Conjunctivae normal.     Pupils: Pupils are equal, round, and reactive to light.  Cardiovascular:     Rate and Rhythm: Normal rate and regular rhythm.     Pulses: Normal pulses.     Heart sounds: Normal heart sounds.     Comments: 2+ bilateral radial/dorsalis pedis pulses with regular rate Pulmonary:     Effort: Pulmonary effort is normal. No respiratory distress.     Breath sounds: Normal breath sounds.     Comments: Nonproductive cough in the room Abdominal:     Palpations: Abdomen is soft.     Tenderness: There is no abdominal tenderness. There is no guarding or rebound.  Musculoskeletal:        General: Normal range of motion.     Cervical back: Normal range of motion and neck supple.     Comments: 5 out of 5 bilateral grip/leg extension strength  Skin:    General: Skin is warm and dry.     Capillary Refill: Capillary refill takes less than 2 seconds.  Neurological:     General: No focal deficit present.     Mental Status: He is alert and oriented to person, place, and time.     Comments: Sensation  intact in all 4 limbs  Psychiatric:        Mood and Affect: Mood normal.     ED Results / Procedures / Treatments   Labs (all labs ordered are listed, but only abnormal results are displayed) Labs Reviewed  SARS CORONAVIRUS 2 BY RT PCR - Abnormal; Notable for the following components:      Result Value   SARS Coronavirus 2 by RT PCR POSITIVE (*)    All other components within normal limits  BASIC METABOLIC PANEL - Abnormal; Notable for the following components:   BUN 7 (*)  Calcium 8.4 (*)    All other components within normal limits    EKG None  Radiology DG Chest 2 View  Result Date: 08/01/2022 CLINICAL DATA:  Cough and weight loss. Productive sputum. Recent travel to [place]. Fatigue and weakness. EXAM: CHEST - 2 VIEW COMPARISON:  08/31/2021 FINDINGS: Tapering of the peripheral pulmonary vasculature favors emphysema. Stable faint reticular interstitial accentuation favoring the lung bases. Atherosclerotic calcification of the aortic arch. 9 mm nodular density projects over the left lateral lung, slightly differing vertical height compared to a potential 8 mm right basilar nodule. While one or both of these could represent nipple shadows, true intrapulmonary nodules are not excluded and chest CT is recommended for further characterization. Degenerative glenohumeral arthropathy, left greater than right. Thoracic spondylosis. No blunting of the costophrenic angles. Gas-filled cervical esophagus or piriform sinuses on the lateral projection, significance uncertain. IMPRESSION: 1. 9 mm nodular density projects over the left lateral lung, slightly differing vertical height compared to a potential 8 mm right basilar nodule. While one or both of these could represent nipple shadows, true intrapulmonary nodules are not excluded and chest CT is recommended for further characterization. 2. 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 3. Thoracic spondylosis. 4.  Degenerative glenohumeral arthropathy, left greater than right. Electronically Signed   By: Gaylyn Rong M.D.   On: 08/01/2022 15:36    Procedures Procedures    Medications Ordered in ED Medications - No data to display  ED Course/ Medical Decision Making/ A&P                             Medical Decision Making Amount and/or Complexity of Data Reviewed Labs: ordered. Radiology: ordered.   Roper A Pinheiro 82 y.o. presented today for shortness of breath.  Working DDx that I considered at this time includes, but not limited to, asthma/COPD exacerbation, URI, viral illness, anemia, ACS, PE, pneumonia, pleural effusion, lung cancer, TB.  R/o DDx: asthma/COPD exacerbation, anemia, ACS, PE, pneumonia, pleural effusion, lung cancer, TB: These are considered less likely due to history of present illness and physical exam findings  Review of prior external notes: 05/01/2022 ED  Unique Tests and My Interpretation:  BMP: Respiratory Panel: COVID-positive  Discussion with Independent Historian: None  Discussion of Management of Tests: None  Risk: Low: based on diagnostic testing/clinical impression and treatment plan  Risk Stratification Score:   Well's Score: 1.5 recent travel  Plan: Patient presented for shortness of breath.  On exam patient was in no acute distress and had stable vitals.  Patient was not hypoxic and tested positive for COVID for I entered the room.  On the chest x-ray nodules were noted that the radiologist recommended outpatient CT scan to be further evaluated.  Due to patient having COVID and no clinical signs of a DVT a D-dimer and CTA were not ordered to evaluate for PE as it is more likely the patient's symptoms are being caused by COVID at this point.  I also suspect patient's weight loss is due to the decreased appetite and with the confirmed COVID I have a low suspicion for tuberculosis at this time.  Due to patient stating he is lost weight a BMP was  ordered to evaluate for electrolytes however patient appears clinically well at this time.  Patient's electrolytes are normal.  Patient has reassuring labs and imaging and is stable for discharge with outpatient follow-up.  Patient is outside the window for Paxlovid as  this has been going on for 2 weeks.  I educated the patient importance of taking plenty of food and fluids to avoid dehydration vision.  I encouraged patient follow-up with primary care provider in the next few days to be reevaluated symptoms may change.  Patient was given return precautions. Patient stable for discharge at this time.  Patient verbalized understanding of plan.         Final Clinical Impression(s) / ED Diagnoses Final diagnoses:  COVID    Rx / DC Orders ED Discharge Orders     None          Remi Deter 08/02/22 1610    Bethann Berkshire, MD 08/02/22 1122

## 2022-08-01 NOTE — ED Triage Notes (Signed)
Pt states he was in [place] for the last 18 days and since being there he has developed a productive cough and lost 20 lbs  Pt states the sputum is green, yellow and red  Pt also states he has been feeling tired and weak since coming back

## 2022-08-01 NOTE — Discharge Instructions (Signed)
Today tested positive for COVID which would explain her symptoms.  Your chest x-ray and labs are reassuring however wanted to follow-up with your primary care provider in the next few days to be reevaluated as symptoms may change.  Please take in plenty of food and fluids to avoid dehydration and malnutrition.  Symptoms begin to worsen please return to ER.

## 2022-08-03 ENCOUNTER — Other Ambulatory Visit (HOSPITAL_COMMUNITY): Payer: Self-pay | Admitting: Adult Health

## 2022-08-03 DIAGNOSIS — R911 Solitary pulmonary nodule: Secondary | ICD-10-CM

## 2022-08-07 ENCOUNTER — Ambulatory Visit: Payer: 59 | Admitting: Psychiatry

## 2022-08-14 ENCOUNTER — Ambulatory Visit (INDEPENDENT_AMBULATORY_CARE_PROVIDER_SITE_OTHER): Payer: 59 | Admitting: Orthopedic Surgery

## 2022-08-14 ENCOUNTER — Encounter: Payer: Self-pay | Admitting: Orthopedic Surgery

## 2022-08-14 ENCOUNTER — Other Ambulatory Visit (INDEPENDENT_AMBULATORY_CARE_PROVIDER_SITE_OTHER): Payer: 59

## 2022-08-14 DIAGNOSIS — G8929 Other chronic pain: Secondary | ICD-10-CM

## 2022-08-14 DIAGNOSIS — M19012 Primary osteoarthritis, left shoulder: Secondary | ICD-10-CM | POA: Diagnosis not present

## 2022-08-14 DIAGNOSIS — M48061 Spinal stenosis, lumbar region without neurogenic claudication: Secondary | ICD-10-CM | POA: Diagnosis not present

## 2022-08-14 NOTE — Progress Notes (Signed)
   This is a follow-up appointment  Encounter Diagnosis  Name Primary?   Chronic left shoulder pain Yes     Chief Complaint  Patient presents with   Shoulder Problem    Left shoulder pain    82 year old male was previously being seen for his back diagnosed with lumbar spinal stenosis however was unable to participate in physical therapy but wishes to resume and initiate treatment for that  He says his left shoulder is bothering him.  He said 2 years ago he was at the [place] and they were going to replace the shoulder.  He presents with progressively worsening shoulder pain and loss of motion with some weakness in abduction and flexion  His exam shows normal internal rotation 45 degrees of external rotation without discomfort he has limited flexion and limited abduction less than 90 degrees and this is painful  Imaging was obtained of his left shoulder OA left shoulder joint space narrowing inferior osteophyte large inferior projection from the acromion on the scapular Y view  Encounter Diagnosis  Name Primary?   Chronic left shoulder pain Yes   Clinic to clinic referral

## 2022-08-22 ENCOUNTER — Ambulatory Visit: Payer: 59 | Admitting: Orthopedic Surgery

## 2022-08-23 ENCOUNTER — Encounter: Payer: Self-pay | Admitting: Orthopedic Surgery

## 2022-08-23 ENCOUNTER — Ambulatory Visit (INDEPENDENT_AMBULATORY_CARE_PROVIDER_SITE_OTHER): Payer: 59 | Admitting: Orthopedic Surgery

## 2022-08-23 VITALS — BP 139/67 | HR 88 | Ht 70.0 in | Wt 160.0 lb

## 2022-08-23 DIAGNOSIS — M19012 Primary osteoarthritis, left shoulder: Secondary | ICD-10-CM

## 2022-08-23 NOTE — Patient Instructions (Signed)
We discussed proceeding with left reverse shoulder arthroplasty  I would like to obtain a CT scan of your left shoulder for surgical planning.  We will need to ensure that you are healthy enough to proceed with surgery.  My staff will work to obtain medical clearance.  Once we have medical clearance, the CT scan has been scheduled or completed, we can then make plans to proceed with surgery.  Surgery will be a 1 night admission.  You will need assistance at home.

## 2022-08-23 NOTE — Progress Notes (Signed)
New Patient Visit  Assessment: Derrick Dean is a 82 y.o. male with the following: 1. Glenohumeral arthritis, left  Plan: [name] has left glenohumeral arthritis, with proximal humeral migration.  He was previously scheduled for left shoulder replacement, a couple of years ago, but ultimately decided against proceeding with surgery at the [place].  He has since been evaluated by Dr. [name], and saw me in clinic today.  On physical exam, he has restricted range of motion.  He has had multiple other joints replaced due to arthritis.  He has pain and decreased function and strength in the left shoulder.  He is otherwise very active, and wishes to remain active.  After reviewing the radiographs with the patient in clinic today, I am recommending reverse shoulder arthroplasty.  He has previously tried medications, injections and physical therapy, without sustained relief.  The procedure was discussed in great detail.  Risks and benefits were reviewed.  All questions have been answered.  He would like to proceed with left reverse shoulder arthroplasty.  We will need to confirm medical clearance in order to proceed with surgery.  He states he has been evaluated by a doctor at the [place], and more recently he has seen someone at the [place] clinic.  We will order a CT scan for preoperative planning.  He would like to proceed with surgery soon as possible.  Follow-up: Return for After medical clearance for OR, After CT Scan.  Subjective:  Chief Complaint  Patient presents with   Shoulder Pain    L shoulder pain     History of Present Illness: Derrick Dean is a 82 y.o. male who presents for evaluation of left shoulder pain.  He has had progressively worsening pain in the left shoulder for several years.  He states he was scheduled for left shoulder arthroplasty approximately 2 years ago, to be completed at the [place].  At the second the last appointment with the surgeon, he elected not to  proceed with surgery at that time.  He continues to have pain, decreased function.  He has tried medications, injections as well as physical therapy, with limited improvement.  He reports a history of rotator cuff repair, greater than 15 years ago.  He remains active.  He is hopeful to proceed with surgery to improve his pain and function.   Review of Systems: No fevers or chills No numbness or tingling No chest pain No shortness of breath No bowel or bladder dysfunction No GI distress No headaches   Medical History:  Past Medical History:  Diagnosis Date   Anemia    Appendicitis    Arthritis    B12 nutritional deficiency    on supplement   GERD (gastroesophageal reflux disease)    Gout    Hepatitis C infection    not treated - pending appointment in april 2015   History of heart artery stent    Hypertension    Presbyopia    wears bifocals   Trouble in sleeping     Past Surgical History:  Procedure Laterality Date   APPENDECTOMY     CARDIAC CATHETERIZATION     7 YRS AGO W/STENT   Left shoulder surgery     Right Hip Replacement Right 01/27/2020   Right Shoulder surgery     TOTAL KNEE ARTHROPLASTY Bilateral     Family History  Problem Relation Age of Onset   Coronary artery disease Mother    Thyroid disease Mother    Heart disease  Mother    Coronary artery disease Father    Heart disease Father    Social History   Tobacco Use   Smoking status: Former    Types: Cigarettes    Quit date: 09/14/2001    Years since quitting: 20.9   Smokeless tobacco: Never  Vaping Use   Vaping Use: Never used  Substance Use Topics   Alcohol use: Yes    Alcohol/week: 1.0 standard drink of alcohol    Types: 1 Cans of beer per week    Comment: 3x week   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    Objective: BP 139/67   Pulse 88   Ht 5\' 10"  (1.778 m)   Wt 160 lb (72.6 kg)   BMI 22.96 kg/m   Physical Exam:  General: Elderly male. Gait: Normal gait.  Thin male.  No deformity of the left shoulder.  He demonstrates pseudoparalysis, limited forward flexion to approximately 90 degrees.  There is some crepitus with range of motion.  Limited ability to get to his lower back.  Fingers warm and well-perfused.  He has active motion in the deltoid muscle.  Sensation intact in the axillary nerve distribution.  IMAGING: I personally reviewed images previously obtained in clinic  X-rays left shoulder demonstrates advanced degenerative changes of glenohumeral joint, with evidence of proximal humeral migration.  No additional injuries are noted.   New Medications:  No orders of the defined types were placed in this encounter.     Oliver Barre, MD  08/23/2022 4:08 PM

## 2022-08-29 ENCOUNTER — Ambulatory Visit
Admission: RE | Admit: 2022-08-29 | Discharge: 2022-08-29 | Disposition: A | Discharge status: Home / Self Care | Payer: 59 | Source: Ambulatory Visit | Attending: Adult Health | Admitting: Adult Health

## 2022-08-29 DIAGNOSIS — R911 Solitary pulmonary nodule: Secondary | ICD-10-CM | POA: Diagnosis present

## 2022-08-29 DIAGNOSIS — M19012 Primary osteoarthritis, left shoulder: Secondary | ICD-10-CM | POA: Diagnosis present

## 2022-08-29 MED ORDER — IOHEXOL 300 MG/ML  SOLN
75.0000 mL | Freq: Once | INTRAMUSCULAR | Status: AC | PRN
  Administered 2022-08-29: 75 mL via INTRAVENOUS

## 2022-08-31 ENCOUNTER — Telehealth: Payer: Self-pay | Admitting: Orthopedic Surgery

## 2022-08-31 NOTE — Telephone Encounter (Signed)
Dr. Dallas Schimke pt - received CD from the [place], put in Leta's box.

## 2022-08-31 NOTE — Telephone Encounter (Signed)
Spoke w/the patient, he is confused.  He wanted to know what his next steps are.  I told him that per his last visit note, he has to have medical clearance before he can be scheduled for surgery.

## 2022-09-01 ENCOUNTER — Ambulatory Visit (HOSPITAL_COMMUNITY): Payer: 59

## 2022-09-05 ENCOUNTER — Ambulatory Visit (HOSPITAL_COMMUNITY): Payer: 59

## 2022-09-06 ENCOUNTER — Other Ambulatory Visit: Payer: Self-pay

## 2022-09-06 ENCOUNTER — Ambulatory Visit (HOSPITAL_COMMUNITY): Payer: No Typology Code available for payment source | Attending: Orthopedic Surgery

## 2022-09-06 DIAGNOSIS — M48061 Spinal stenosis, lumbar region without neurogenic claudication: Secondary | ICD-10-CM | POA: Diagnosis present

## 2022-09-06 DIAGNOSIS — R29898 Other symptoms and signs involving the musculoskeletal system: Secondary | ICD-10-CM | POA: Diagnosis present

## 2022-09-06 DIAGNOSIS — M545 Low back pain, unspecified: Secondary | ICD-10-CM | POA: Diagnosis present

## 2022-09-06 NOTE — Therapy (Signed)
OUTPATIENT PHYSICAL THERAPY THORACOLUMBAR EVALUATION   Patient Name: Derrick Dean MRN: [id number] DOB:[birth year], 82 y.o., male Today's Date: 09/06/2022  END OF SESSION:  PT End of Session - 09/06/22 1447     Visit Number 1    Number of Visits 8    Date for PT Re-Evaluation 10/04/22    Authorization Type [employer]    Progress Note Due on Visit 10    PT Start Time 0235    PT Stop Time 0315    PT Time Calculation (min) 40 min    Activity Tolerance Patient tolerated treatment well    Behavior During Therapy WFL for tasks assessed/performed             Past Medical History:  Diagnosis Date   Anemia    Appendicitis    Arthritis    B12 nutritional deficiency    on supplement   GERD (gastroesophageal reflux disease)    Gout    Hepatitis C infection    not treated - pending appointment in april 2015   History of heart artery stent    Hypertension    Presbyopia    wears bifocals   Trouble in sleeping    Past Surgical History:  Procedure Laterality Date   APPENDECTOMY     CARDIAC CATHETERIZATION     7 YRS AGO W/STENT   Left shoulder surgery     Right Hip Replacement Right 01/27/2020   Right Shoulder surgery     TOTAL KNEE ARTHROPLASTY Bilateral    Patient Active Problem List   Diagnosis Date Noted   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   Other abnormalities of gait and mobility 01/03/2021   Gout 01/03/2021   Posttraumatic stress disorder 01/03/2021   Gastroesophageal reflux disease 01/03/2021   Hyperlipidemia 01/03/2021   Vitamin B12 deficiency 01/03/2021   Shoulder pain 01/03/2021   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   Coronary atherosclerosis 01/03/2021   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   Chronic midline low back pain without sciatica 05/24/2018   Lumbar spondylosis 05/24/2018   Pain syndrome, chronic 06/15/2017   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/15/2017   Sprain of right wrist 10/31/2016   Pulmonary fibrosis  (HCC) 08/23/2016   S/P ORIF (open reduction internal fixation) fracture 05/05/2016   Closed fracture of right distal radius 04/18/2016   Common cold 09/25/2013   CAD S/P percutaneous coronary angioplasty 09/25/2013   HCV (hepatitis C virus) 05/15/2013   Cholelithiasis 05/15/2013   Abdominal pain, epigastric 05/15/2013   Pancreatitis, acute 05/13/2013   Unspecified essential hypertension 05/13/2013   Transaminasemia 05/13/2013   Chest pain 05/12/2013    PCP: Jeanice Lim [place]  REFERRING PROVIDER: Vickki Hearing, MD  REFERRING DIAG:  Diagnosis  M48.061 (ICD-10-CM) - Degenerative lumbar spinal stenosis    Rationale for Evaluation and Treatment: Rehabilitation  THERAPY DIAG:  Degenerative lumbar spinal stenosis - Plan: PT plan of care cert/re-cert  Low back pain, unspecified back pain laterality, unspecified chronicity, unspecified whether sciatica present - Plan: PT plan of care cert/re-cert  Other symptoms and signs involving the musculoskeletal system - Plan: PT plan of care cert/re-cert  ONSET DATE: chronic back pain  SUBJECTIVE:  SUBJECTIVE STATEMENT: Chronic back pain; went to Dr. [name] due to hip pain; thought his replacement was going bad; x-rays showed back issue and referred for therapy; supposed to be having a Left shoulder replacement per Dr. Dallas Schimke  PERTINENT HISTORY:  Needs left shoulder replacement R THA Bilateral TKA  PAIN:  Are you having pain? Yes: NPRS scale: 3-10/10 Pain location: right side low back buttocks Pain description: stabbing  Aggravating factors: wrong move; bend Relieving factors: lay down, rest  PRECAUTIONS: Fall  WEIGHT BEARING RESTRICTIONS: No  FALLS:  Has patient fallen in last 6 months? Yes. Number of falls 10+ "I don't pick my feet  up"  OCCUPATION: retired  PLOF: Independent  PATIENT GOALS: reduce my pain and be more mobile  NEXT MD VISIT: return after therapy  OBJECTIVE:   DIAGNOSTIC FINDINGS:  CLINICAL DATA:  221993 Right hip pain 221993   EXAM: DG HIP (WITH OR WITHOUT PELVIS) 2-3V RIGHT   COMPARISON:  09/28/2021   FINDINGS: Right hip arthroplasty components project in expected location without surrounding lucency. No fracture or dislocation. Bony pelvis intact. Spondylitic changes in the lower lumbar spine as before. Penile prosthesis components stable. Patchy iliofemoral arterial calcifications.   IMPRESSION: Right hip arthroplasty without fracture or other acute finding.  PATIENT SURVEYS:  FOTO 53  COGNITION: Overall cognitive status: Within functional limits for tasks assessed     SENSATION: WFL  POSTURE: rounded shoulders, forward head, and decreased lumbar lordosis  PALPATION: Not tested at eval  LUMBAR ROM:   AROM eval  Flexion 40% available*  Extension 10% available *  Right lateral flexion 25%* available  Left lateral flexion 10% available  Right rotation   Left rotation    (Blank rows = not tested)  LOWER EXTREMITY ROM:     Active  Right eval Left eval  Hip flexion    Hip extension    Hip abduction    Hip adduction    Hip internal rotation    Hip external rotation    Knee flexion    Knee extension    Ankle dorsiflexion    Ankle plantarflexion    Ankle inversion    Ankle eversion     (Blank rows = not tested)  LOWER EXTREMITY MMT:    MMT Right eval Left eval  Hip flexion 4-* 4+  Hip extension    Hip abduction    Hip adduction    Hip internal rotation    Hip external rotation    Knee flexion    Knee extension 4+ 5  Ankle dorsiflexion 4+ 5  Ankle plantarflexion    Ankle inversion    Ankle eversion     (Blank rows = not tested)    FUNCTIONAL TESTS:  5 times sit to stand: 14.93 sec using hands on knees to assist up to standing   TODAY'S  TREATMENT:  DATE: 09/06/22 physical therapy evaluation and HEP instruction    PATIENT EDUCATION:  Education details: Patient educated on exam findings, POC, scope of PT, HEP, and what to expect next visit. Person educated: Patient Education method: Explanation, Demonstration, and Handouts Education comprehension: verbalized understanding, returned demonstration, verbal cues required, and tactile cues required  HOME EXERCISE PROGRAM: Access Code: [email or web address] URL: [email or web address] Date: 09/06/2022 Prepared by: [facility] - Rehab  Exercises - Supine Lower Trunk Rotation  - 2 x daily - 7 x weekly - 1 sets - 10 reps - Supine Hamstring Stretch  - 2 x daily - 7 x weekly - 1 sets - 3 reps - 20 sec hold  ASSESSMENT:  CLINICAL IMPRESSION: Patient is a 82 y.o. male who was seen today for physical therapy evaluation and treatment for M48.061 (ICD-10-CM) - Degenerative lumbar spinal stenosis.  Patient demonstrates muscle weakness, reduced ROM, and fascial restrictions which are likely contributing to symptoms of pain and are negatively impacting patient ability to perform ADLs and functional mobility tasks. Patient will benefit from skilled physical therapy services to address these deficits to reduce pain and improve level of function with ADLs and functional mobility tasks.  .   OBJECTIVE IMPAIRMENTS: decreased activity tolerance, decreased mobility, difficulty walking, decreased ROM, decreased strength, hypomobility, increased fascial restrictions, impaired perceived functional ability, and pain.   ACTIVITY LIMITATIONS: carrying, lifting, bending, sitting, standing, squatting, sleeping, stairs, bed mobility, locomotion level, and caring for others  PARTICIPATION LIMITATIONS: meal prep, cleaning, laundry, shopping, community activity, and yard work  [facility] POTENTIAL:  Good  CLINICAL DECISION MAKING: Stable/uncomplicated  EVALUATION COMPLEXITY: Low   GOALS: Goals reviewed with patient? No  SHORT TERM GOALS: Target date: 09/20/2022  patient will be independent with initial HEP  Baseline: Goal status: INITIAL  2.  Patient will self report 30% improvement to improve tolerance for functional activity   Baseline:  Goal status: INITIAL   LONG TERM GOALS: Target date: 10/04/2022  Patient will be independent in self management strategies to improve quality of life and functional outcomes.  Baseline:  Goal status: INITIAL  2.  Patient will self report 50% improvement to improve tolerance for functional activity   Baseline:  Goal status: INITIAL  3.  Patient will increase  leg MMTs to 5/5 without pain to promote return to ambulation community distances with minimal deviation.  Baseline:  see above Goal status: INITIAL  4.  Patient will improve FOTO score by 10 points to demonstrate improved perceived functional mobility  Baseline: 53 Goal status: INITIAL  5.  Patient will improve 5 times sit to stand score from 14.93 sec to 12 sec to demonstrate improved functional mobility and increased lower extremity strength.  Baseline:  Goal status: INITIAL   PLAN:  PT FREQUENCY: 2x/week  PT DURATION: 4 weeks  PLANNED INTERVENTIONS: Therapeutic exercises, Therapeutic activity, Neuromuscular re-education, Balance training, Gait training, Patient/Family education, Joint manipulation, Joint mobilization, Stair training, Orthotic/Fit training, DME instructions, Aquatic Therapy, Dry Needling, Electrical stimulation, Spinal manipulation, Spinal mobilization, Cryotherapy, Moist heat, Compression bandaging, scar mobilization, Splintting, Taping, Traction, Ultrasound, Ionotophoresis 4mg /ml Dexamethasone, and Manual therapy .  PLAN FOR NEXT SESSION: Review HEP and goals; test glute and hamstring strength; progress lumbar mobility and core strength as  able   3:31 PM, 09/06/22 Kimbly Eanes Small Berniece Abid MPT [facility] [place] [phone] Ph:[phone]

## 2022-09-12 ENCOUNTER — Telehealth: Payer: Self-pay | Admitting: Orthopedic Surgery

## 2022-09-12 NOTE — Telephone Encounter (Signed)
Spoke w/someone from the [place], explained that the patient's surgery will not be scheduled until he has medical clearance and explained to her that the patient is aware of this.

## 2022-09-15 NOTE — Patient Instructions (Signed)
Derrick Dean  09/15/2022     @PREFPERIOPPHARMACY @   Your procedure is scheduled on 09/25/2022.  Report to [facility] at 6:00 A.M.  Call this number if you have problems the morning of surgery:  [phone]  If you experience any cold or flu symptoms such as cough, fever, chills, shortness of breath, etc. between now and your scheduled surgery, please notify us at the above number.   Remember:    Do not eat or drink anything after midnight.    Please consume your ERAS drink at 3:30AM the morning of the procedure and nothing to drink after  that.   Please follow the instructions attached for the CHG shower for 5 Day use.       Take these medicines the morning of surgery with A SIP OF WATER : Allopurinol and Norvasc    Do not wear jewelry, make-up or nail polish, including gel polish,  artificial nails, or any other type of covering on natural nails (fingers and  toes).  Do not wear lotions, powders, or perfumes, or deodorant.  Do not shave 48 hours prior to surgery.  Men may shave face and neck.  Do not bring valuables to the hospital.  [facility] is not responsible for any belongings or valuables.  Contacts, dentures or bridgework may not be worn into surgery.  Leave your suitcase in the car.  After surgery it may be brought to your room.  For patients admitted to the hospital, discharge time will be determined by your treatment team.  Patients discharged the day of surgery will not be allowed to drive home.   Name and phone number of your driver:   Family Special instructions:  N/A  Please read over the following fact sheets that you were given. Care and Recovery After Surgery   Shoulder Replacement Shoulder replacement is surgery to replace damaged parts of the shoulder joint with artificial parts (prostheses). One or two parts may be used to replace this joint: The humeral component replaces the head of the upper arm bone (humerus). This is a rounded ball  that is attached to a stem that fits into the humerus. The glenoid component replaces the socket (glenoid depression). The prostheses are usually made of metal and plastic. Depending on the damage to your shoulder, the surgeon may replace just the humeral head (hemiarthroplasty) or replace both the humeral head and the glenoid (total shoulder replacement). The surrounding muscles and tendons hold the prosthetic parts in place. This procedure may be done to relieve joint pain or to treat severe shoulder fractures or arthritis. This surgery may be done if other non-surgical treatments have not worked. Tell a health care provider about: Any allergies you have. All medicines you are taking, including vitamins, herbs, eye drops, creams, and over-the-counter medicines. Any problems you or family members have had with anesthetic medicines. Any blood disorders you have. Any surgeries you have had. Any medical conditions you have. Whether you are pregnant or may be pregnant. What are the risks? Generally, this is a safe procedure. However, problems may occur, including: Infection. Bleeding. Blood clots. Allergic reactions to medicines. Damage to nearby structures, organs, or nerves. Fracture of the upper arm bone during or after surgery. Problems with the shoulder, such as: Instability of the shoulder after surgery. Loosening of the glenoid component over time. What happens before the procedure? Staying hydrated Follow instructions from your health care provider about hydration, which may include: Up to 2 hours before the procedure -  you may continue to drink clear liquids, such as water, clear fruit juice, black coffee, and plain tea.  Eating and drinking restrictions Follow instructions from your health care provider about eating and drinking, which may include: 8 hours before the procedure - stop eating heavy meals or foods, such as meat, fried foods, or fatty foods. 6 hours before the  procedure - stop eating light meals or foods, such as toast or cereal. 6 hours before the procedure - stop drinking milk or drinks that contain milk. 2 hours before the procedure - stop drinking clear liquids. Medicines Ask your health care provider about: Changing or stopping your regular medicines. This is especially important if you are taking diabetes medicines or blood thinners. Taking medicines such as aspirin and ibuprofen. These medicines can thin your blood. Do not take these medicines unless your health care provider tells you to take them. Taking over-the-counter medicines, vitamins, herbs, and supplements. General instructions Plan to have a responsible adult take you home from the hospital or clinic. Plan to have a responsible adult care for you for the time you are told after you leave the hospital or clinic. This is important. Having someone to assist you at home for the first few weeks after the procedure is also recommended. Do not use any products that contain nicotine or tobacco for at least 4 weeks before the procedure. These products include cigarettes, e-cigarettes, and chewing tobacco. If you need help quitting, ask your health care provider. Keep your body and teeth clean. Germs from anywhere in your body can travel to your new joint and infect it. Tell your health care provider if you: Plan to have dental care and routine cleanings. Develop any skin infections. Ask your health care provider: How your surgery site will be marked. What steps will be taken to help prevent infection. These may include: Removing hair at the surgery site. Washing skin with a germ-killing soap. Receiving antibiotic medicine. What happens during the procedure? An IV will be inserted into one of your veins. You will be given one or more of the following: A medicine to help you relax (sedative). A medicine to numb the area (local anesthetic). A medicine to make you fall asleep (general  anesthetic). A medicine that is injected into your shoulder area to numb everything around the injection site (regional anesthetic). An incision will be made on the front of the shoulder. The upper arm bone will be removed from the socket to expose the ball-like end of the upper arm. The center cavity of the humerus bone will be cleaned and enlarged to create a hollow area that matches the shape of the implant stem. The top end of the bone will be smoothed so the stem will be level with the bone surface when it is inserted. If the ball of the prosthesis is a separate piece, the proper size will be selected and attached. If the socket portion of the joint is healthy and the surrounding muscles are in good condition, the surgeon may decide not to replace it. However, if the socket needs to be replaced: The surgeon will prepare the socket surface by removing the remaining damaged cartilage. The socket bone will be gently reshaped to fit the implant. The glenoid component will be implanted and cemented into position. The arm bone, with its new artificial head, will be replaced in the socket. The surgeon will reattach the supporting tendons and close the incision with stitches (sutures). A bandage (dressing) will be placed over  your incision. Your arm will be placed in a sling or immobilizer, and a support pillow will be placed under your elbow. Tubes will be placed to remove excess drainage. These are usually removed after a couple of days. The procedure may vary among health care providers and hospitals. What happens after the procedure? Your blood pressure, heart rate, breathing rate, and blood oxygen level will be monitored until you leave the hospital or clinic. Your arm will be numb if you were given a regional anesthetic. This may last until the next day. You will be given pain medicine as needed. An icing device will be placed around your shoulder. This helps to control pain and swelling. Your  arm will be in a sling or immobilizer. You will need to wear this for 2-4 weeks after surgery or as told by your health care provider. Your health care team may begin to show you exercises for your shoulder. Do not drive until your health care provider says that it is safe. Summary Shoulder replacement is surgery to replace damaged parts of the shoulder joint with artificial parts (prostheses). Based on the damage to your shoulder, the surgeon may replace just the humeral head (hemiarthroplasty) or replace both the humeral head and the glenoid (total shoulder replacement). Taking medicine, icing the painful area, and doing exercises as told by your health care provider will help control your shoulder pain and swelling after surgery. Your arm will be in a sling or immobilizer. You will need to wear this for 2-4 weeks or as told by your health care provider. This information is not intended to replace advice given to you by your health care provider. Make sure you discuss any questions you have with your health care provider. Document Revised: 09/17/2019 Document Reviewed: 09/17/2019 Elsevier Patient Education  2024 [employer]    General Anesthesia, Adult General anesthesia is the use of medicine to make you fall asleep (unconscious) for a medical procedure. General anesthesia must be used for certain procedures. It is often recommended for surgery or procedures that: Last a long time. Require you to be still or in an unusual position. Are major and can cause blood loss. Affect your breathing. The medicines used for general anesthesia are called general anesthetics. During general anesthesia, these medicines are given along with medicines that: Prevent pain. Control your blood pressure. Relax your muscles. Prevent nausea and vomiting after the procedure. Tell a health care provider about: Any allergies you have. All medicines you are taking, including vitamins, herbs, eye drops, creams,  and over-the-counter medicines. Your history of any: Medical conditions you have, including: High blood pressure. Bleeding problems. Diabetes. Heart or lung conditions, such as: Heart failure. Sleep apnea. Asthma. Chronic obstructive pulmonary disease (COPD). Current or recent illnesses, such as: Upper respiratory, chest, or ear infections. Cough or fever. Tobacco or drug use, including marijuana or alcohol use. Depression or anxiety. Surgeries and types of anesthetics you have had. Problems you or family members have had with anesthetic medicines. Whether you are pregnant or may be pregnant. Whether you have any chipped or loose teeth, dentures, caps, bridgework, or issues with your mouth, swallowing, or choking. What are the risks? Your health care provider will talk with you about risks. These may include: Allergic reaction to the medicines. Lung and heart problems. Inhaling food or liquid from the stomach into the lungs (aspiration). Nerve injury. Injury to the lips, mouth, teeth, or gums. Stroke. Waking up during your procedure and being unable to move. This  is rare. These problems are more likely to develop if you are having a major surgery or if you have an advanced or serious medical condition. You can prevent some of these complications by answering all of your health care provider's questions thoroughly and by following all instructions before your procedure. General anesthesia can cause side effects, including: Nausea or vomiting. A sore throat or hoarseness from the breathing tube. Wheezing or coughing. Shaking chills or feeling cold. Body aches. Sleepiness. Confusion, agitation (delirium), or anxiety. What happens before the procedure? When to stop eating and drinking Follow instructions from your health care provider about what you may eat and drink before your procedure. If you do not follow your health care provider's instructions, your procedure may be  delayed or canceled. Medicines Ask your health care provider about: Changing or stopping your regular medicines. These include any diabetes medicines or blood thinners you take. Taking medicines such as aspirin and ibuprofen. These medicines can thin your blood. Do not take them unless your health care provider tells you to. Taking over-the-counter medicines, vitamins, herbs, and supplements. General instructions Do not use any products that contain nicotine or tobacco for at least 4 weeks before the procedure. These products include cigarettes, chewing tobacco, and vaping devices, such as e-cigarettes. If you need help quitting, ask your health care provider. If you brush your teeth on the morning of the procedure, make sure to spit out all of the water and toothpaste. If told by your health care provider, bring your sleep apnea device with you to surgery (if applicable). If you will be going home right after the procedure, plan to have a responsible adult: Take you home from the hospital or clinic. You will not be allowed to drive. Care for you for the time you are told. What happens during the procedure?  An IV will be inserted into one of your veins. You will be given one or more of the following through a face mask or IV: A sedative. This helps you relax. Anesthesia. This will: Numb certain areas of your body. Make you fall asleep for surgery. After you are unconscious, a breathing tube may be inserted down your throat to help you breathe. This will be removed before you wake up. An anesthesia provider, such as an anesthesiologist, will stay with you throughout your procedure. The anesthesia provider will: Keep you comfortable and safe by continuing to give you medicines and adjusting the amount of medicine that you get. Monitor your blood pressure, heart rate, and oxygen levels to make sure that the anesthetics do not cause any problems. The procedure may vary among health care  providers and hospitals. What happens after the procedure? Your blood pressure, temperature, heart rate, breathing rate, and blood oxygen level will be monitored until you leave the hospital or clinic. You will wake up in a recovery area. You may wake up slowly. You may be given medicine to help you with pain, nausea, or any other side effects from the anesthesia. Summary General anesthesia is the use of medicine to make you fall asleep (unconscious) for a medical procedure. Follow your health care provider's instructions about when to stop eating, drinking, or taking certain medicines before your procedure. Plan to have a responsible adult take you home from the hospital or clinic. This information is not intended to replace advice given to you by your health care provider. Make sure you discuss any questions you have with your health care provider. Document Revised: 06/30/2021 Document Reviewed:  06/30/2021 Elsevier Patient Education  2024 [employer]   How to Use Chlorhexidine Before Surgery Chlorhexidine gluconate (CHG) is a germ-killing (antiseptic) solution that is used to clean the skin. It can get rid of the bacteria that normally live on the skin and can keep them away for about 24 hours. To clean your skin with CHG, you may be given: A CHG solution to use in the shower or as part of a sponge bath. A prepackaged cloth that contains CHG. Cleaning your skin with CHG may help lower the risk for infection: While you are staying in the intensive care unit of the hospital. If you have a vascular access, such as a central line, to provide short-term or long-term access to your veins. If you have a catheter to drain urine from your bladder. If you are on a ventilator. A ventilator is a machine that helps you breathe by moving air in and out of your lungs. After surgery. What are the risks? Risks of using CHG include: A skin reaction. Hearing loss, if CHG gets in your ears and you have a  perforated eardrum. Eye injury, if CHG gets in your eyes and is not rinsed out. The CHG product catching fire. Make sure that you avoid smoking and flames after applying CHG to your skin. Do not use CHG: If you have a chlorhexidine allergy or have previously reacted to chlorhexidine. On babies younger than 10 months of age. How to use CHG solution Use CHG only as told by your health care provider, and follow the instructions on the label. Use the full amount of CHG as directed. Usually, this is one bottle. During a shower Follow these steps when using CHG solution during a shower (unless your health care provider gives you different instructions): Start the shower. Use your normal soap and shampoo to wash your face and hair. Turn off the shower or move out of the shower stream. Pour the CHG onto a clean washcloth. Do not use any type of brush or rough-edged sponge. Starting at your neck, lather your body down to your toes. Make sure you follow these instructions: If you will be having surgery, pay special attention to the part of your body where you will be having surgery. Scrub this area for at least 1 minute. Do not use CHG on your head or face. If the solution gets into your ears or eyes, rinse them well with water. Avoid your genital area. Avoid any areas of skin that have broken skin, cuts, or scrapes. Scrub your back and under your arms. Make sure to wash skin folds. Let the lather sit on your skin for 1-2 minutes or as long as told by your health care provider. Thoroughly rinse your entire body in the shower. Make sure that all body creases and crevices are rinsed well. Dry off with a clean towel. Do not put any substances on your body afterward--such as powder, lotion, or perfume--unless you are told to do so by your health care provider. Only use lotions that are recommended by the manufacturer. Put on clean clothes or pajamas. If it is the night before your surgery, sleep in clean  sheets.  During a sponge bath Follow these steps when using CHG solution during a sponge bath (unless your health care provider gives you different instructions): Use your normal soap and shampoo to wash your face and hair. Pour the CHG onto a clean washcloth. Starting at your neck, lather your body down to your toes. Make  sure you follow these instructions: If you will be having surgery, pay special attention to the part of your body where you will be having surgery. Scrub this area for at least 1 minute. Do not use CHG on your head or face. If the solution gets into your ears or eyes, rinse them well with water. Avoid your genital area. Avoid any areas of skin that have broken skin, cuts, or scrapes. Scrub your back and under your arms. Make sure to wash skin folds. Let the lather sit on your skin for 1-2 minutes or as long as told by your health care provider. Using a different clean, wet washcloth, thoroughly rinse your entire body. Make sure that all body creases and crevices are rinsed well. Dry off with a clean towel. Do not put any substances on your body afterward--such as powder, lotion, or perfume--unless you are told to do so by your health care provider. Only use lotions that are recommended by the manufacturer. Put on clean clothes or pajamas. If it is the night before your surgery, sleep in clean sheets. How to use CHG prepackaged cloths Only use CHG cloths as told by your health care provider, and follow the instructions on the label. Use the CHG cloth on clean, dry skin. Do not use the CHG cloth on your head or face unless your health care provider tells you to. When washing with the CHG cloth: Avoid your genital area. Avoid any areas of skin that have broken skin, cuts, or scrapes. Before surgery Follow these steps when using a CHG cloth to clean before surgery (unless your health care provider gives you different instructions): Using the CHG cloth, vigorously scrub the  part of your body where you will be having surgery. Scrub using a back-and-forth motion for 3 minutes. The area on your body should be completely wet with CHG when you are done scrubbing. Do not rinse. Discard the cloth and let the area air-dry. Do not put any substances on the area afterward, such as powder, lotion, or perfume. Put on clean clothes or pajamas. If it is the night before your surgery, sleep in clean sheets.  For general bathing Follow these steps when using CHG cloths for general bathing (unless your health care provider gives you different instructions). Use a separate CHG cloth for each area of your body. Make sure you wash between any folds of skin and between your fingers and toes. Wash your body in the following order, switching to a new cloth after each step: The front of your neck, shoulders, and chest. Both of your arms, under your arms, and your hands. Your stomach and groin area, avoiding the genitals. Your right leg and foot. Your left leg and foot. The back of your neck, your back, and your buttocks. Do not rinse. Discard the cloth and let the area air-dry. Do not put any substances on your body afterward--such as powder, lotion, or perfume--unless you are told to do so by your health care provider. Only use lotions that are recommended by the manufacturer. Put on clean clothes or pajamas. Contact a health care provider if: Your skin gets irritated after scrubbing. You have questions about using your solution or cloth. You swallow any chlorhexidine. Call your local poison control center ([phone] in the [place] Get help right away if: Your eyes itch badly, or they become very red or swollen. Your skin itches badly and is red or swollen. Your hearing changes. You have trouble seeing. You have swelling or  tingling in your mouth or throat. You have trouble breathing. These symptoms may represent a serious problem that is an emergency. Do not wait to see if the  symptoms will go away. Get medical help right away. Call your local emergency services (911 in the [place] Do not drive yourself to the hospital. Summary Chlorhexidine gluconate (CHG) is a germ-killing (antiseptic) solution that is used to clean the skin. Cleaning your skin with CHG may help to lower your risk for infection. You may be given CHG to use for bathing. It may be in a bottle or in a prepackaged cloth to use on your skin. Carefully follow your health care provider's instructions and the instructions on the product label. Do not use CHG if you have a chlorhexidine allergy. Contact your health care provider if your skin gets irritated after scrubbing. This information is not intended to replace advice given to you by your health care provider. Make sure you discuss any questions you have with your health care provider. Document Revised: 08/01/2021 Document Reviewed: 06/14/2020 Elsevier Patient Education  2023 [employer].

## 2022-09-18 ENCOUNTER — Encounter (HOSPITAL_COMMUNITY)
Admission: RE | Admit: 2022-09-18 | Discharge: 2022-09-18 | Disposition: A | Discharge status: Home / Self Care | Payer: No Typology Code available for payment source | Source: Ambulatory Visit | Attending: Orthopedic Surgery | Admitting: Orthopedic Surgery

## 2022-09-18 ENCOUNTER — Ambulatory Visit: Payer: Self-pay | Admitting: Orthopedic Surgery

## 2022-09-18 ENCOUNTER — Encounter (HOSPITAL_COMMUNITY): Payer: Self-pay

## 2022-09-18 VITALS — BP 154/78 | HR 61 | Temp 98.5°F | Resp 18 | Ht 70.0 in | Wt 160.1 lb

## 2022-09-18 DIAGNOSIS — Z01818 Encounter for other preprocedural examination: Secondary | ICD-10-CM | POA: Insufficient documentation

## 2022-09-18 DIAGNOSIS — K759 Inflammatory liver disease, unspecified: Secondary | ICD-10-CM

## 2022-09-18 DIAGNOSIS — I10 Essential (primary) hypertension: Secondary | ICD-10-CM

## 2022-09-18 DIAGNOSIS — D508 Other iron deficiency anemias: Secondary | ICD-10-CM

## 2022-09-18 DIAGNOSIS — M19012 Primary osteoarthritis, left shoulder: Secondary | ICD-10-CM

## 2022-09-18 LAB — BASIC METABOLIC PANEL
Anion gap: 11 (ref 5–15)
BUN: 11 mg/dL (ref 8–23)
CO2: 23 mmol/L (ref 22–32)
Calcium: 9.2 mg/dL (ref 8.9–10.3)
Chloride: 103 mmol/L (ref 98–111)
Creatinine, Ser: 0.95 mg/dL (ref 0.61–1.24)
GFR, Estimated: >60 mL/min (ref >60)
Glucose, Bld: 99 mg/dL (ref 70–99)
Potassium: 3.8 mmol/L (ref 3.5–5.1)
Sodium: 137 mmol/L (ref 135–145)

## 2022-09-18 LAB — CBC
HCT: 36 % — ABNORMAL LOW (ref 39.0–52.0)
Hemoglobin: 12.1 g/dL — ABNORMAL LOW (ref 13.0–17.0)
MCH: 30.2 pg (ref 26.0–34.0)
MCHC: 33.6 g/dL (ref 30.0–36.0)
MCV: 89.8 fL (ref 80.0–100.0)
Platelets: 265 10*3/uL (ref 150–400)
RBC: 4.01 MIL/uL — ABNORMAL LOW (ref 4.22–5.81)
RDW: 14.6 % (ref 11.5–15.5)
WBC: 4.5 10*3/uL (ref 4.0–10.5)
nRBC: 0 % (ref 0.0–0.2)

## 2022-09-18 LAB — SURGICAL PCR SCREEN
MRSA, PCR: NEGATIVE
Staphylococcus aureus: POSITIVE — AB

## 2022-09-25 ENCOUNTER — Telehealth: Payer: Self-pay | Admitting: Orthopedic Surgery

## 2022-09-25 ENCOUNTER — Observation Stay (HOSPITAL_COMMUNITY)
Admission: RE | Admit: 2022-09-25 | Discharge: 2022-09-26 | Disposition: A | Discharge status: Home / Self Care | Payer: No Typology Code available for payment source | Attending: Orthopedic Surgery | Admitting: Orthopedic Surgery

## 2022-09-25 ENCOUNTER — Ambulatory Visit (HOSPITAL_COMMUNITY): Payer: No Typology Code available for payment source

## 2022-09-25 ENCOUNTER — Encounter (HOSPITAL_COMMUNITY)
Admission: RE | Disposition: A | Discharge status: Home / Self Care | Payer: Self-pay | Source: Home / Self Care | Attending: Orthopedic Surgery

## 2022-09-25 ENCOUNTER — Ambulatory Visit (HOSPITAL_COMMUNITY): Payer: No Typology Code available for payment source | Admitting: Certified Registered Nurse Anesthetist

## 2022-09-25 ENCOUNTER — Encounter (HOSPITAL_COMMUNITY): Payer: Self-pay | Admitting: Orthopedic Surgery

## 2022-09-25 ENCOUNTER — Other Ambulatory Visit: Payer: Self-pay

## 2022-09-25 DIAGNOSIS — M19012 Primary osteoarthritis, left shoulder: Secondary | ICD-10-CM

## 2022-09-25 DIAGNOSIS — Z96653 Presence of artificial knee joint, bilateral: Secondary | ICD-10-CM | POA: Diagnosis not present

## 2022-09-25 DIAGNOSIS — Z96641 Presence of right artificial hip joint: Secondary | ICD-10-CM | POA: Diagnosis not present

## 2022-09-25 DIAGNOSIS — Z955 Presence of coronary angioplasty implant and graft: Secondary | ICD-10-CM | POA: Diagnosis not present

## 2022-09-25 DIAGNOSIS — I2510 Atherosclerotic heart disease of native coronary artery without angina pectoris: Secondary | ICD-10-CM

## 2022-09-25 DIAGNOSIS — I10 Essential (primary) hypertension: Secondary | ICD-10-CM | POA: Diagnosis not present

## 2022-09-25 DIAGNOSIS — Z7982 Long term (current) use of aspirin: Secondary | ICD-10-CM | POA: Insufficient documentation

## 2022-09-25 DIAGNOSIS — Z79899 Other long term (current) drug therapy: Secondary | ICD-10-CM | POA: Insufficient documentation

## 2022-09-25 DIAGNOSIS — Z87891 Personal history of nicotine dependence: Secondary | ICD-10-CM | POA: Diagnosis not present

## 2022-09-25 HISTORY — PX: REVERSE SHOULDER ARTHROPLASTY: SHX5054

## 2022-09-25 LAB — HEMOGLOBIN AND HEMATOCRIT, BLOOD
HCT: 35.6 % — ABNORMAL LOW (ref 39.0–52.0)
Hemoglobin: 11.8 g/dL — ABNORMAL LOW (ref 13.0–17.0)

## 2022-09-25 SURGERY — ARTHROPLASTY, SHOULDER, TOTAL, REVERSE
Anesthesia: General | Site: Shoulder | Laterality: Left

## 2022-09-25 MED ORDER — PHENYLEPHRINE HCL-NACL 20-0.9 MG/250ML-% IV SOLN
INTRAVENOUS | Status: AC
  Filled 2022-09-25: qty 250

## 2022-09-25 MED ORDER — EPHEDRINE SULFATE (PRESSORS) 50 MG/ML IJ SOLN
INTRAMUSCULAR | Status: DC | PRN
  Administered 2022-09-25: 5 mg via INTRAVENOUS
  Administered 2022-09-25 (×3): 10 mg via INTRAVENOUS

## 2022-09-25 MED ORDER — STERILE WATER FOR IRRIGATION IR SOLN
Status: DC | PRN
  Administered 2022-09-25: 3000 mL

## 2022-09-25 MED ORDER — CELECOXIB 100 MG PO CAPS
100.0000 mg | ORAL_CAPSULE | Freq: Two times a day (BID) | ORAL | Status: DC
  Administered 2022-09-25 – 2022-09-26 (×3): 100 mg via ORAL
  Filled 2022-09-25 (×3): qty 1

## 2022-09-25 MED ORDER — AMLODIPINE BESYLATE 5 MG PO TABS
10.0000 mg | ORAL_TABLET | Freq: Every day | ORAL | Status: DC
  Administered 2022-09-26: 10 mg via ORAL
  Filled 2022-09-25: qty 2

## 2022-09-25 MED ORDER — TRANEXAMIC ACID-NACL 1000-0.7 MG/100ML-% IV SOLN
1000.0000 mg | INTRAVENOUS | Status: AC
  Administered 2022-09-25: 1000 mg via INTRAVENOUS

## 2022-09-25 MED ORDER — PROPOFOL 10 MG/ML IV BOLUS
INTRAVENOUS | Status: AC
  Filled 2022-09-25: qty 20

## 2022-09-25 MED ORDER — DEXAMETHASONE SODIUM PHOSPHATE 10 MG/ML IJ SOLN
INTRAMUSCULAR | Status: DC | PRN
  Administered 2022-09-25: 10 mg via INTRAVENOUS

## 2022-09-25 MED ORDER — ROCURONIUM BROMIDE 10 MG/ML (PF) SYRINGE
PREFILLED_SYRINGE | INTRAVENOUS | Status: AC
  Filled 2022-09-25: qty 10

## 2022-09-25 MED ORDER — ASPIRIN 81 MG PO TBEC
81.0000 mg | DELAYED_RELEASE_TABLET | Freq: Two times a day (BID) | ORAL | Status: DC
  Administered 2022-09-26: 81 mg via ORAL
  Filled 2022-09-25: qty 1

## 2022-09-25 MED ORDER — LIDOCAINE HCL (PF) 1 % IJ SOLN
INTRAMUSCULAR | Status: DC | PRN
  Administered 2022-09-25: 3 mL

## 2022-09-25 MED ORDER — TRAZODONE HCL 50 MG PO TABS
50.0000 mg | ORAL_TABLET | Freq: Every day | ORAL | Status: DC
  Administered 2022-09-25: 50 mg via ORAL
  Filled 2022-09-25: qty 1

## 2022-09-25 MED ORDER — LACTATED RINGERS IV SOLN: INTRAVENOUS | Status: DC

## 2022-09-25 MED ORDER — SODIUM CHLORIDE 0.9 % IR SOLN
Status: DC | PRN
  Administered 2022-09-25: 1000 mL

## 2022-09-25 MED ORDER — CEFAZOLIN SODIUM-DEXTROSE 2-4 GM/100ML-% IV SOLN
INTRAVENOUS | Status: AC
  Filled 2022-09-25: qty 100

## 2022-09-25 MED ORDER — OXYCODONE HCL 5 MG PO TABS
5.0000 mg | ORAL_TABLET | ORAL | Status: DC | PRN
  Administered 2022-09-25: 5 mg via ORAL
  Filled 2022-09-25: qty 1

## 2022-09-25 MED ORDER — LACTATED RINGERS IV BOLUS
500.0000 mL | Freq: Once | INTRAVENOUS | Status: AC
  Administered 2022-09-25: 500 mL via INTRAVENOUS

## 2022-09-25 MED ORDER — ONDANSETRON HCL 4 MG/2ML IJ SOLN: 4.0000 mg | Freq: Once | INTRAMUSCULAR | Status: DC | PRN

## 2022-09-25 MED ORDER — PROPOFOL 10 MG/ML IV BOLUS
INTRAVENOUS | Status: DC | PRN
  Administered 2022-09-25: 30 mg via INTRAVENOUS
  Administered 2022-09-25: 100 mg via INTRAVENOUS

## 2022-09-25 MED ORDER — ORAL CARE MOUTH RINSE: 15.0000 mL | Freq: Once | OROMUCOSAL | Status: AC

## 2022-09-25 MED ORDER — PHENYLEPHRINE HCL-NACL 20-0.9 MG/250ML-% IV SOLN
INTRAVENOUS | Status: DC | PRN
  Administered 2022-09-25: 40 ug/min via INTRAVENOUS
  Administered 2022-09-25: 30 ug/min via INTRAVENOUS

## 2022-09-25 MED ORDER — OXYCODONE HCL 5 MG PO TABS: 10.0000 mg | ORAL_TABLET | ORAL | Status: DC | PRN

## 2022-09-25 MED ORDER — LIDOCAINE HCL (PF) 1 % IJ SOLN
INTRAMUSCULAR | Status: AC
  Filled 2022-09-25: qty 30

## 2022-09-25 MED ORDER — HYDROMORPHONE HCL 1 MG/ML IJ SOLN: 0.2500 mg | INTRAMUSCULAR | Status: DC | PRN

## 2022-09-25 MED ORDER — LIDOCAINE HCL (CARDIAC) PF 50 MG/5ML IV SOSY
PREFILLED_SYRINGE | INTRAVENOUS | Status: DC | PRN
  Administered 2022-09-25: 80 mg via INTRAVENOUS

## 2022-09-25 MED ORDER — MIDAZOLAM HCL 2 MG/2ML IJ SOLN
INTRAMUSCULAR | Status: AC
  Filled 2022-09-25: qty 2

## 2022-09-25 MED ORDER — BUPIVACAINE HCL (PF) 0.5 % IJ SOLN
INTRAMUSCULAR | Status: AC
  Filled 2022-09-25: qty 30

## 2022-09-25 MED ORDER — LOSARTAN POTASSIUM 50 MG PO TABS
50.0000 mg | ORAL_TABLET | Freq: Every day | ORAL | Status: DC
  Administered 2022-09-25 – 2022-09-26 (×2): 50 mg via ORAL
  Filled 2022-09-25 (×2): qty 1

## 2022-09-25 MED ORDER — PHENYLEPHRINE HCL (PRESSORS) 10 MG/ML IV SOLN
INTRAVENOUS | Status: DC | PRN
  Administered 2022-09-25 (×2): 100 ug via INTRAVENOUS
  Administered 2022-09-25: 80 ug via INTRAVENOUS

## 2022-09-25 MED ORDER — PHENYLEPHRINE 80 MCG/ML (10ML) SYRINGE FOR IV PUSH (FOR BLOOD PRESSURE SUPPORT)
PREFILLED_SYRINGE | INTRAVENOUS | Status: AC
  Filled 2022-09-25: qty 10

## 2022-09-25 MED ORDER — BUPIVACAINE LIPOSOME 1.3 % IJ SUSP
INTRAMUSCULAR | Status: DC | PRN
  Administered 2022-09-25: 10 mL via PERINEURAL

## 2022-09-25 MED ORDER — EPHEDRINE 5 MG/ML INJ
INTRAVENOUS | Status: AC
  Filled 2022-09-25: qty 5

## 2022-09-25 MED ORDER — ROCURONIUM 10MG/ML (10ML) SYRINGE FOR MEDFUSION PUMP - OPTIME
INTRAVENOUS | Status: DC | PRN
  Administered 2022-09-25: 10 mg via INTRAVENOUS
  Administered 2022-09-25: 50 mg via INTRAVENOUS
  Administered 2022-09-25: 20 mg via INTRAVENOUS

## 2022-09-25 MED ORDER — CEFAZOLIN SODIUM-DEXTROSE 2-4 GM/100ML-% IV SOLN
2.0000 g | INTRAVENOUS | Status: AC
  Administered 2022-09-25: 2 g via INTRAVENOUS

## 2022-09-25 MED ORDER — ACETAMINOPHEN 325 MG PO TABS: 325.0000 mg | ORAL_TABLET | Freq: Four times a day (QID) | ORAL | Status: DC | PRN

## 2022-09-25 MED ORDER — FENTANYL CITRATE (PF) 100 MCG/2ML IJ SOLN
INTRAMUSCULAR | Status: AC
  Filled 2022-09-25: qty 2

## 2022-09-25 MED ORDER — MORPHINE SULFATE (PF) 2 MG/ML IV SOLN: 0.5000 mg | INTRAVENOUS | Status: DC | PRN

## 2022-09-25 MED ORDER — LIDOCAINE HCL (PF) 2 % IJ SOLN
INTRAMUSCULAR | Status: AC
  Filled 2022-09-25: qty 5

## 2022-09-25 MED ORDER — ONDANSETRON HCL 4 MG PO TABS: 4.0000 mg | ORAL_TABLET | Freq: Four times a day (QID) | ORAL | Status: DC | PRN

## 2022-09-25 MED ORDER — VANCOMYCIN HCL 1000 MG IV SOLR
INTRAVENOUS | Status: DC | PRN
  Administered 2022-09-25: 1000 mg

## 2022-09-25 MED ORDER — CHLORHEXIDINE GLUCONATE 0.12 % MT SOLN
OROMUCOSAL | Status: AC
  Filled 2022-09-25: qty 15

## 2022-09-25 MED ORDER — ONDANSETRON HCL 4 MG/2ML IJ SOLN
INTRAMUSCULAR | Status: DC | PRN
  Administered 2022-09-25: 4 mg via INTRAVENOUS

## 2022-09-25 MED ORDER — CHLORHEXIDINE GLUCONATE 0.12 % MT SOLN
15.0000 mL | Freq: Once | OROMUCOSAL | Status: AC
  Administered 2022-09-25: 15 mL via OROMUCOSAL

## 2022-09-25 MED ORDER — DEXAMETHASONE SODIUM PHOSPHATE 10 MG/ML IJ SOLN
INTRAMUSCULAR | Status: AC
  Filled 2022-09-25: qty 1

## 2022-09-25 MED ORDER — VANCOMYCIN HCL 1000 MG IV SOLR
INTRAVENOUS | Status: AC
  Filled 2022-09-25: qty 20

## 2022-09-25 MED ORDER — MIDAZOLAM HCL 2 MG/2ML IJ SOLN
2.0000 mg | Freq: Once | INTRAMUSCULAR | Status: AC
  Administered 2022-09-25: 1 mg via INTRAVENOUS

## 2022-09-25 MED ORDER — PRAVASTATIN SODIUM 40 MG PO TABS
40.0000 mg | ORAL_TABLET | Freq: Every day | ORAL | Status: DC
  Administered 2022-09-25: 40 mg via ORAL
  Filled 2022-09-25: qty 1

## 2022-09-25 MED ORDER — DIPHENHYDRAMINE HCL 12.5 MG/5ML PO ELIX: 12.5000 mg | ORAL_SOLUTION | ORAL | Status: DC | PRN

## 2022-09-25 MED ORDER — BUPIVACAINE LIPOSOME 1.3 % IJ SUSP
INTRAMUSCULAR | Status: AC
  Filled 2022-09-25: qty 10

## 2022-09-25 MED ORDER — ACETAMINOPHEN 500 MG PO TABS
1000.0000 mg | ORAL_TABLET | Freq: Three times a day (TID) | ORAL | Status: DC
  Administered 2022-09-25 – 2022-09-26 (×3): 1000 mg via ORAL
  Filled 2022-09-25 (×3): qty 2

## 2022-09-25 MED ORDER — TRANEXAMIC ACID-NACL 1000-0.7 MG/100ML-% IV SOLN
INTRAVENOUS | Status: AC
  Filled 2022-09-25: qty 100

## 2022-09-25 MED ORDER — BUPIVACAINE HCL (PF) 0.5 % IJ SOLN
INTRAMUSCULAR | Status: DC | PRN
  Administered 2022-09-25: 10 mL via PERINEURAL

## 2022-09-25 MED ORDER — ONDANSETRON HCL 4 MG/2ML IJ SOLN
INTRAMUSCULAR | Status: AC
  Filled 2022-09-25: qty 2

## 2022-09-25 MED ORDER — ONDANSETRON HCL 4 MG/2ML IJ SOLN: 4.0000 mg | Freq: Four times a day (QID) | INTRAMUSCULAR | Status: DC | PRN

## 2022-09-25 MED ORDER — FENTANYL CITRATE (PF) 100 MCG/2ML IJ SOLN
INTRAMUSCULAR | Status: DC | PRN
  Administered 2022-09-25: 50 ug via INTRAVENOUS

## 2022-09-25 SURGICAL SUPPLY — 83 items
APL PRP STRL LF DISP 70% ISPRP (MISCELLANEOUS) ×1
BASEPLATE AUG FULL 24 10D (Plate) IMPLANT
BIT DRILL FLUTED 3.0 STRL (BIT) IMPLANT
BLADE SAW SGTL 83.5X18.5 (BLADE) ×2 IMPLANT
BLADE SURG SZ10 CARB STEEL (BLADE) ×4 IMPLANT
BNDG GAUZE ELAST 4 BULKY (GAUZE/BANDAGES/DRESSINGS) ×2 IMPLANT
CALIBRATOR GLENOID VIP 5-D (SYSTAGENIX WOUND MANAGEMENT) IMPLANT
CHLORAPREP W/TINT 26 (MISCELLANEOUS) ×4 IMPLANT
CLOTH BEACON ORANGE TIMEOUT ST (SAFETY) ×2 IMPLANT
COOLER ICEMAN CLASSIC (MISCELLANEOUS) ×2 IMPLANT
COUNTER NDL 20CT MAGNET RED (NEEDLE) IMPLANT
COVER LIGHT HANDLE STERIS (MISCELLANEOUS) ×4 IMPLANT
CUP SUT UNIV REVERS 39+2 LT (Shoulder) IMPLANT
DRAPE HALF SHEET 40X57 (DRAPES) ×2 IMPLANT
DRAPE SHOULDER BEACH CHAIR (DRAPES) ×2 IMPLANT
DRAPE U-SHAPE 47X51 STRL (DRAPES) ×2 IMPLANT
DRESSING AQUACEL AG ADV 3.5X12 (MISCELLANEOUS) ×2 IMPLANT
DRSG AQUACEL AG ADV 3.5X12 (MISCELLANEOUS) ×1
ELECT REM PT RETURN 9FT ADLT (ELECTROSURGICAL) ×1
ELECTRODE REM PT RTRN 9FT ADLT (ELECTROSURGICAL) ×2 IMPLANT
FIBERTAPE CERCLAGE TLINK SUT (SUTURE) IMPLANT
GAUZE SPONGE 4X4 12PLY STRL (GAUZE/BANDAGES/DRESSINGS) IMPLANT
GLENOSPHERE 39+4 LAT/24 UNI RV (Joint) IMPLANT
GLOVE BIO SURGEON STRL SZ7 (GLOVE) IMPLANT
GLOVE BIO SURGEON STRL SZ8 (GLOVE) ×2 IMPLANT
GLOVE BIOGEL PI IND STRL 7.0 (GLOVE) ×4 IMPLANT
GLOVE ECLIPSE 6.5 STRL STRAW (GLOVE) IMPLANT
GLOVE SRG 8 PF TXTR STRL LF DI (GLOVE) ×2 IMPLANT
GLOVE SURG UNDER POLY LF SZ8 (GLOVE) ×1
GOWN STRL REUS W/ TWL XL LVL3 (GOWN DISPOSABLE) ×2 IMPLANT
GOWN STRL REUS W/TWL LRG LVL3 (GOWN DISPOSABLE) ×4 IMPLANT
GOWN STRL REUS W/TWL XL LVL3 (GOWN DISPOSABLE) ×1
HANDPIECE INTERPULSE COAX TIP (DISPOSABLE) ×1
HOOD W/PEELAWAY (MISCELLANEOUS) ×6 IMPLANT
INSERT HUMERAL 39/+6 (Insert) IMPLANT
IV NS IRRIG 3000ML ARTHROMATIC (IV SOLUTION) ×2 IMPLANT
KIT POSITION SHOULDER SCHLEI (MISCELLANEOUS) ×2 IMPLANT
KIT SET UNIVERSAL (KITS) IMPLANT
KIT STABILIZATION SHOULDER (MISCELLANEOUS) ×2 IMPLANT
KIT TURNOVER KIT A (KITS) ×2 IMPLANT
MANIFOLD NEPTUNE II (INSTRUMENTS) ×2 IMPLANT
MARKER SKIN DUAL TIP RULER LAB (MISCELLANEOUS) ×2 IMPLANT
NDL HYPO 21X1.5 SAFETY (NEEDLE) IMPLANT
NDL MA TROC 1/2 (NEEDLE) IMPLANT
NDL MAYO 6 CRC TAPER PT (NEEDLE) IMPLANT
NEEDLE HYPO 21X1.5 SAFETY (NEEDLE) ×1 IMPLANT
NEEDLE MA TROC 1/2 (NEEDLE) ×1 IMPLANT
NEEDLE MAYO 6 CRC TAPER PT (NEEDLE) ×1 IMPLANT
NS IRRIG 1000ML POUR BTL (IV SOLUTION) ×2 IMPLANT
PACK BASIC III (CUSTOM PROCEDURE TRAY) ×1
PACK SRG BSC III STRL LF ECLPS (CUSTOM PROCEDURE TRAY) IMPLANT
PACK TOTAL JOINT (CUSTOM PROCEDURE TRAY) ×2 IMPLANT
PAD ABD 5X9 TENDERSORB (GAUZE/BANDAGES/DRESSINGS) IMPLANT
PAD COLD SHLDR SM WRAP-ON (PAD) IMPLANT
PASSER CERCLAGE STRT MED DISP (ORTHOPEDIC DISPOSABLE SUPPLIES) IMPLANT
PIN NITINOL TARGETER 2.8 (PIN) IMPLANT
POST MODULAR MGS BASEPLATE 25 (Post) IMPLANT
SCREW PERI LOCK 5.5X32 (Screw) IMPLANT
SCREW PERI NL (Screw) IMPLANT
SCREW PERIPHERAL 5.5X20 LOCK (Screw) IMPLANT
SET BASIN LINEN APH (SET/KITS/TRAYS/PACK) ×2 IMPLANT
SET HNDPC FAN SPRY TIP SCT (DISPOSABLE) IMPLANT
SLING ULTRA II L (ORTHOPEDIC SUPPLIES) IMPLANT
SPONGE T-LAP 18X18 ~~LOC~~+RFID (SPONGE) ×4 IMPLANT
STEM HUMERAL UNI REVERS SZ9 (Stem) IMPLANT
STRIP CLOSURE SKIN 1/2X4 (GAUZE/BANDAGES/DRESSINGS) ×4 IMPLANT
SUT MNCRL AB 4-0 PS2 18 (SUTURE) ×2 IMPLANT
SUT MON AB 2-0 CT1 36 (SUTURE) ×2 IMPLANT
SUT VIC AB 0 CT1 27 (SUTURE) ×1
SUT VIC AB 0 CT1 27XBRD ANTBC (SUTURE) ×2 IMPLANT
SUT VIC AB 1 CT1 27 (SUTURE) ×2
SUT VIC AB 1 CT1 27XBRD ANTBC (SUTURE) ×4 IMPLANT
SUTURE TAPE 1.3 40 TPR END (SUTURE) ×4 IMPLANT
SUTURETAPE 1.3 40 TPR END (SUTURE) ×2
SUTURETAPE 1.3 40 W/NDL BLK/WH (SUTURE) ×4 IMPLANT
SYR 30ML LL (SYRINGE) IMPLANT
SYR BULB IRRIG 60ML STRL (SYRINGE) ×2 IMPLANT
TENSIONER FIBERTAPE CERCLAGE (DISPOSABLE) IMPLANT
TOWEL OR 17X26 4PK STRL BLUE (TOWEL DISPOSABLE) ×2 IMPLANT
TRAY FOLEY W/BAG SLVR 16FR (SET/KITS/TRAYS/PACK) ×1
TRAY FOLEY W/BAG SLVR 16FR ST (SET/KITS/TRAYS/PACK) ×2 IMPLANT
WATER STERILE IRR 1000ML POUR (IV SOLUTION) ×2 IMPLANT
YANKAUER SUCT 12FT TUBE ARGYLE (SUCTIONS) ×2 IMPLANT

## 2022-09-25 NOTE — Anesthesia Procedure Notes (Signed)
Procedure Name: Intubation Date/Time: 09/25/2022 7:52 AM  Performed by: Moshe Salisbury, CRNAPre-anesthesia Checklist: Patient identified, Patient being monitored, Timeout performed, Emergency Drugs available and Suction available Patient Re-evaluated:Patient Re-evaluated prior to induction Oxygen Delivery Method: Circle system utilized Preoxygenation: Pre-oxygenation with 100% oxygen Induction Type: IV induction Ventilation: Mask ventilation without difficulty Laryngoscope Size: Mac and 3 Grade View: Grade I Tube type: Oral Tube size: 7.0 mm Number of attempts: 1 Airway Equipment and Method: Stylet Placement Confirmation: ETT inserted through vocal cords under direct vision, positive ETCO2 and breath sounds checked- equal and bilateral Secured at: 23 cm Tube secured with: Tape Dental Injury: Teeth and Oropharynx as per pre-operative assessment

## 2022-09-25 NOTE — Telephone Encounter (Signed)
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  I tried to return the pt's call, unable to lvm bc the mb is full.

## 2022-09-25 NOTE — Anesthesia Postprocedure Evaluation (Signed)
Anesthesia Post Note  Patient: Derrick Dean  Procedure(s) Performed: LEFT REVERSE SHOULDER ARTHROPLASTY (Left: Shoulder)  Patient location during evaluation: PACU Anesthesia Type: General Level of consciousness: awake and alert and oriented Pain management: pain level controlled Vital Signs Assessment: post-procedure vital signs reviewed and stable Respiratory status: spontaneous breathing, nonlabored ventilation and respiratory function stable Cardiovascular status: blood pressure returned to baseline and stable Postop Assessment: no apparent nausea or vomiting Anesthetic complications: no  No notable events documented.   Last Vitals:  Vitals:   09/25/22 1230 09/25/22 1245  BP: (!) 141/65 (!) 142/67  Pulse: 60 61  Resp: 12 12  Temp:    SpO2: 99% 99%    Last Pain:  Vitals:   09/25/22 1215  PainSc: 0-No pain                 Taura Lamarre C Darelle Kings

## 2022-09-25 NOTE — Progress Notes (Signed)
Instructed on incentive spirometer. 1750 ml obtained. Tolerated well. 

## 2022-09-25 NOTE — Interval H&P Note (Signed)
History and Physical Interval Note:  09/25/2022 7:15 AM  Derrick Dean  has presented today for surgery, with the diagnosis of LEFT GLENOHUMERAL ARTHRITIS.  The various methods of treatment have been discussed with the patient and family. After consideration of risks, benefits and other options for treatment, the patient has consented to  Procedure(s) with comments: LEFT REVERSE SHOULDER ARTHROPLASTY (Left) - RNFA NEEDED as a surgical intervention.  The patient's history has been reviewed, patient examined, no change in status, stable for surgery.  I have reviewed the patient's chart and labs.  Questions were answered to the patient's satisfaction.    Left glenohumeral arthritis.  CT scan complete.  Can correct deformity of glenoid with reverse shoulder arthroplasty.  Procedure discussed.  All questions answered.      Oliver Barre

## 2022-09-25 NOTE — H&P (Signed)
Below is the most recent clinic note for [employer]; any pertinent information regarding their recent medical history will be updated on the day of surgery.    New Patient Visit  Assessment: AFTAB NESSETH is a 82 y.o. male with the following: 1. Glenohumeral arthritis, left  Plan: Vashawn A Bostic has left glenohumeral arthritis, with proximal humeral migration.  He was previously scheduled for left shoulder replacement, a couple of years ago, but ultimately decided against proceeding with surgery at the [place].  He has since been evaluated by Dr. [name], and saw me in clinic today.  On physical exam, he has restricted range of motion.  He has had multiple other joints replaced due to arthritis.  He has pain and decreased function and strength in the left shoulder.  He is otherwise very active, and wishes to remain active.  After reviewing the radiographs with the patient in clinic today, I am recommending reverse shoulder arthroplasty.  He has previously tried medications, injections and physical therapy, without sustained relief.  The procedure was discussed in great detail.  Risks and benefits were reviewed.  All questions have been answered.  He would like to proceed with left reverse shoulder arthroplasty.  We will need to confirm medical clearance in order to proceed with surgery.  He states he has been evaluated by a doctor at the [facility], and more recently he has seen someone at the [facility].  We will order a CT scan for preoperative planning.  He would like to proceed with surgery soon as possible.  Follow-up: No follow-ups on file.  Subjective:  No chief complaint on file.   History of Present Illness: Eland Folkerts Culleton is a 82 y.o. male who presents for evaluation of left shoulder pain.  He has had progressively worsening pain in the left shoulder for several years.  He states he was scheduled for left shoulder arthroplasty approximately 2 years ago, to be completed  at the [place].  At the second the last appointment with the surgeon, he elected not to proceed with surgery at that time.  He continues to have pain, decreased function.  He has tried medications, injections as well as physical therapy, with limited improvement.  He reports a history of rotator cuff repair, greater than 15 years ago.  He remains active.  He is hopeful to proceed with surgery to improve his pain and function.   Review of Systems: No fevers or chills No numbness or tingling No chest pain No shortness of breath No bowel or bladder dysfunction No GI distress No headaches   Medical History:  Past Medical History:  Diagnosis Date   Anemia    Appendicitis    Arthritis    B12 nutritional deficiency    on supplement   GERD (gastroesophageal reflux disease)    Gout    Hepatitis C infection    not treated - pending appointment in april 2015   History of heart artery stent    Hypertension    Presbyopia    wears bifocals   Trouble in sleeping     Past Surgical History:  Procedure Laterality Date   APPENDECTOMY     CARDIAC CATHETERIZATION     7 YRS AGO W/STENT   CHOLECYSTECTOMY     Left shoulder surgery     Right Hip Replacement Right 01/27/2020   Right Shoulder surgery     TOTAL KNEE ARTHROPLASTY Bilateral     Family History  Problem Relation Age of Onset  Coronary artery disease Mother    Thyroid disease Mother    Heart disease Mother    Coronary artery disease Father    Heart disease Father    Social History   Tobacco Use   Smoking status: Former    Types: Cigarettes    Quit date: 09/14/2001    Years since quitting: 21.0   Smokeless tobacco: Never  Vaping Use   Vaping Use: Never used  Substance Use Topics   Alcohol use: Yes    Alcohol/week: 1.0 standard drink of alcohol    Types: 1 Cans of beer per week    Comment: 3x week   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   allopurinol (ZYLOPRIM) 100 MG tablet Take 3 tablets (300 mg total) by mouth daily. Do not start until after resolution of active gout flare (Patient taking differently: Take 300 mg by mouth daily as needed (gout flare).)   amLODipine (NORVASC) 10 MG tablet Take 10 mg by mouth daily.   aspirin EC 81 MG tablet Take 81 mg by mouth every 6 (six) hours as needed for moderate pain.   cholecalciferol (VITAMIN D) 1000 UNITS tablet Take 1,000 Units by mouth daily.   colchicine 0.6 MG tablet Take 1 tablet (0.6 mg total) by mouth daily. Take 2 tablets initially (1.2 mg), then 1 tablet one hour later. Can repeat 1 tablet up to three times. (Patient taking differently: Take 0.6 mg by mouth daily as needed (gout).)   pravastatin (PRAVACHOL) 40 MG tablet Take 40 mg by mouth at bedtime.   traZODone (DESYREL) 50 MG tablet Take 50 mg by mouth at bedtime.    Objective: BP (!) 155/90 (BP Location: Right Arm)   Pulse 61   Temp 97.6 F (36.4 C)   SpO2 100%   Physical Exam:  General: Elderly male. Gait: Normal gait.  Thin male.  No deformity of the left shoulder.  He demonstrates pseudoparalysis, limited forward flexion to approximately 90 degrees.  There is some crepitus with range of motion.  Limited ability to get to his lower back.  Fingers warm and well-perfused.  He has active motion in the deltoid muscle.  Sensation intact in the axillary nerve distribution.  IMAGING: I personally reviewed images previously obtained in clinic  X-rays left shoulder demonstrates advanced degenerative changes of glenohumeral joint, with evidence of proximal humeral migration.  No additional injuries are noted.      Oliver Barre, MD  09/25/2022 7:14 AM

## 2022-09-25 NOTE — Progress Notes (Signed)
Pt arrived to room 310 via bed from PACU. A&O, VSS. DDI to left shoulder with small spot of old blood noted and marked. Pt denies any c/o pain. Left hand with good grip, warm with cap refill < 2 sec & good sensation. IceMan ice pack and left arm sling intact. IVF infusing without s/s infiltration. Bed alarm on for safety, call bell within reach. Pt advised to call for needs, states understanding.

## 2022-09-25 NOTE — Anesthesia Preprocedure Evaluation (Signed)
Anesthesia Evaluation  Patient identified by MRN, date of birth, ID band Patient awake    Reviewed: Allergy & Precautions, H&P , NPO status , Patient's Chart, lab work & pertinent test results  Airway Mallampati: II  TM Distance: >3 FB Neck ROM: Full    Dental no notable dental hx. (+) Upper Dentures, Dental Advisory Given, Missing   Pulmonary neg pulmonary ROS, former smoker   Pulmonary exam normal breath sounds clear to auscultation       Cardiovascular Exercise Tolerance: Good hypertension, Pt. on medications + CAD and + Cardiac Stents  Normal cardiovascular exam Rhythm:Regular Rate:Normal     Neuro/Psych  PSYCHIATRIC DISORDERS Anxiety Depression    negative neurological ROS     GI/Hepatic ,GERD  Controlled,,(+) Hepatitis -, C  Endo/Other  negative endocrine ROS    Renal/GU negative Renal ROS  negative genitourinary   Musculoskeletal  (+) Arthritis  (gout), Osteoarthritis,    Abdominal   Peds negative pediatric ROS (+)  Hematology  (+) Blood dyscrasia, anemia   Anesthesia Other Findings   Reproductive/Obstetrics negative OB ROS                             Anesthesia Physical Anesthesia Plan  ASA: 3  Anesthesia Plan: General   Post-op Pain Management: Regional block* and Dilaudid IV   Induction: Intravenous  PONV Risk Score and Plan: 3 and Ondansetron and Dexamethasone  Airway Management Planned: Oral ETT  Additional Equipment:   Intra-op Plan:   Post-operative Plan: Extubation in OR  Informed Consent: I have reviewed the patients History and Physical, chart, labs and discussed the procedure including the risks, benefits and alternatives for the proposed anesthesia with the patient or authorized representative who has indicated his/her understanding and acceptance.     Dental advisory given  Plan Discussed with: CRNA and Surgeon  Anesthesia Plan Comments:         Anesthesia Quick Evaluation

## 2022-09-25 NOTE — Anesthesia Procedure Notes (Signed)
Anesthesia Regional Block: Interscalene brachial plexus block   Pre-Anesthetic Checklist: , timeout performed,  Correct Patient, Correct Site, Correct Laterality,  Correct Procedure, Correct Position, site marked,  Risks and benefits discussed,  Surgical consent,  Pre-op evaluation,  At surgeon's request and post-op pain management  Laterality: Upper and Left  Prep: chloraprep       Needles:  Injection technique: Single-shot  Needle Type: Echogenic Stimulator Needle     Needle Length: 8.3cm  Needle Gauge: 22   Needle insertion depth: 5 cm   Additional Needles:   Procedures:, nerve stimulator,,, ultrasound used (permanent image in chart),,     Nerve Stimulator or Paresthesia:  Response: no Twitch elicited, 0.5 mA, 0.3 ms, 5 cm  Additional Responses:   Narrative:  Start time: 09/25/2022 7:05 AM End time: 09/25/2022 7:14 AM Injection made incrementally with aspirations every 5 mL.  Performed by: Personally  Anesthesiologist: Molli Barrows, MD  Additional Notes: Block assessed prior to start of surgery

## 2022-09-25 NOTE — Progress Notes (Signed)
Hgb 11.8, Hct 35.6 results called to Dr Dallas Schimke. No new orders given.

## 2022-09-25 NOTE — Progress Notes (Signed)
Assisted to chair.  Left hand continues to be warm with good sensation and good grip.  Ice and sling still in place.

## 2022-09-25 NOTE — Transfer of Care (Addendum)
Immediate Anesthesia Transfer of Care Note  Patient: Derrick Dean  Procedure(s) Performed: LEFT REVERSE SHOULDER ARTHROPLASTY (Left: Shoulder)  Patient Location: PACU  Anesthesia Type:General  Level of Consciousness: awake  Airway & Oxygen Therapy: Patient Spontanous Breathing  Post-op Assessment: Report given to RN  Post vital signs: Reviewed and stable  Last Vitals:  Vitals Value Taken Time  BP 148/65 09/25/22 1150  Temp 97.5   Pulse 71 09/25/22 1154  Resp 14 09/25/22 1154  SpO2 100 % 09/25/22 1154  Vitals shown include unvalidated device data.  Last Pain:  Vitals:   09/25/22 0647  PainSc: 4          Complications: No notable events documented.

## 2022-09-25 NOTE — Op Note (Signed)
Orthopaedic Surgery Operative Note (CSN: [id number])  Derrick Dean  [birth year] Date of Surgery: 09/25/2022   Diagnoses:  Left glenohumeral arthritis  Procedure: Left reverse shoulder arthroplasty   Operative Finding Successful completion of the planned procedure.  Left reverse shoulder arthroplasty with a size 9 stem, 39 glenosphere, 10 degree augment on the baseplate and a posterior offset tray with a 9 mm poly.  Calcar fracture was noted with the implant, and a single cerclage tape was placed to provide additional support.   Post-Op Diagnosis: Same Surgeons:Primary: Oliver Barre, MD Assistants: Cecile Sheerer Location: [facility] OR ROOM 4 Anesthesia: General with regional anesthesia Antibiotics: Ancef 2 g with local vancomycin powder 1 g at the surgical site Tourniquet time: N/A Estimated Blood Loss: 300 cc Complications: None Specimens: None  Implants: Implant Name Type Inv. Item Serial No. Manufacturer Lot No. LRB No. Used Action  POST MODULAR 25 - [id number] Post POST MODULAR 25  [employer] [id number] Left 1 Implanted  GLENOSPHERE 39+4 LAT/24 UNI RV - [id number] Joint GLENOSPHERE 39+4 LAT/24 UNI RV  [employer] [id number] Left 1 Implanted  BASEPLATE AUG FULL 24 10D - [id number] Plate BASEPLATE AUG FULL 24 10D  [employer] [id number] Left 1 Implanted  SCREW PERIPHERAL 5.5X20 LOCK - [id number] Screw SCREW PERIPHERAL 5.5X20 LOCK  [employer] [id number] Left 1 Implanted  SCREW PERI NL - [id number] Screw SCREW PERI NL  [employer] [id number] Left 1 Implanted  SCREW PERIPHERAL 5.5X20 LOCK - [id number] Screw SCREW PERIPHERAL 5.5X20 LOCK  [employer] [id number] Left 1 Implanted  SCREW PERI LOCK 5.5X32 - [id number] Screw SCREW PERI LOCK 5.5X32  [employer] [id number] Left 1 Implanted  CUP SUT UNIV REVERS 39+2 LT - [id number] Shoulder CUP SUT UNIV REVERS 39+2 LT  [employer] [id number] Left 1 Implanted  STEM HUMERAL UNI REVERS SZ9 - [id number] Stem STEM HUMERAL UNI REVERS SZ9  [employer] [id number]  Left 1 Implanted  INSERT HUMERAL 39/+6 - [id number] Insert INSERT HUMERAL 39/+6  [employer] [id number] Left 1 Implanted    Indications for Surgery:   Derrick Dean is a 82 y.o. male with severe left glenohumeral joint arthritis.  He attempted activity modifications, medications, injections, none of which provided sustained relief.  Imaging demonstrated advanced degenerative changes, with an intact rotator cuff.  CT scan demonstrated eccentric wear on the glenoid, and due to his overall function, age and eccentric wear, I recommended reverse shoulder arthroplasty which would allow [place] to correct the deformity.  Benefits and risks of operative and nonoperative management were discussed prior to surgery with the patient and informed consent form was completed.  Specific risks including, but not limited to, infection, need for additional surgery, bleeding, persistent pain, non-union, implant loosening, malunion, persistent pain, stiffness, dislocation, damage to surrounding structures including nerves and/or blood vessels and more severe complications associated with anesthesia were discussed with the patient.  The patient has elected to proceed.    Procedure:   The patient was identified properly. Informed consent was obtained and the surgical site was marked. The patient was taken to the operating room where general anesthesia was induced.  The patient was positioned in beach chair position.  The left shoulder was prepped and draped in the usual sterile fashion.  Timeout was performed before the beginning of the case.  The patient received appropriate antibiotics prior to making incision.  In addition, the patient received 1 g TXA prior to starting.  This was confirmed during the preincision timeout.  We made an incision  for the standard deltopectoral approach was performed with a #10 blade. We dissected down through the subcutaneous tissues and the cephalic vein was taken laterally with the deltoid.  The clavipectoral fascia was incised in line with the incision. Deep retractors were placed. The long of the biceps tendon was identified and there was significant tenosynovitis present.  A biceps tenodesis was performed to the pectoralis tendon with #2 Fiberwire. The remaining biceps was followed up into the rotator interval where it was released.   The remaining subscapularis was taken down in a full thickness layer with capsule along the humeral neck extending inferiorly around the humeral head.  The subscapularis tendon was degenerative, and diminutive overall.  We continued releasing the capsule directly off of the osteophytes inferiorly all the way around the corner. This allowed [place] to dislocate the humeral head. Multiple tag stitches were placed in the remaining subscapularis tendon to maintain control during the release of the tendon, and for the remainder of the case.   The humeral head had evidence of severe osteoarthritic wear with full-thickness cartilage loss and exposed subchondral bone. There was flattening of the humeral head.  Based on our preoperative planning, and his overall function and age, despite the rotator cuff being intact, I felt that it was best to proceed with reverse shoulder arthroplasty.  There were osteophytes along the inferior humeral neck. The osteophytes were removed using an osteotome and a rongeur.  At this point, the anatomic neck was well visualized.     A humeral cutting guide with a version rod was secured to the anterior aspect of the humeral head. The version was set at 20 of retroversion. A humeral osteotomy was performed with an oscillating saw. The head fragment was passed off to the back table, and used to estimate the humeral head size for our implant.  A cut protector was placed.  The humerus was retracted posteriorly and we turned our attention to glenoid exposure.  The subscapularis was again identified and we took care to palpate the axillary nerve  anteriorly and verify its position with gentle palpation as well as the tug test.  We then released the SGHL with bovie cautery prior to placing a curved mayo at the junction of the anterior glenoid well above the axillary nerve and bluntly dissecting the subscapularis from the capsule.  We then carefully protected the axillary nerve as we gently released the inferior capsule to fully mobilize the subscapularis.  An anterior deltoid retractor was then placed as well as a small Hohmann retractor superiorly.   The glenoid was inspected and had evidence of severe osteoarthritic wear with full-thickness cartilage loss and exposed subchondral bone.  There were extensive posterior and inferior osteophytes, as well as a sclerotic bone overall.  There was a small amount of anterior labrum remaining.  The remaining labrum was removed taking great care not to disrupt the posterior capsule.   Based on our preoperative templating, a personalized glenoid drill guide was placed and used to drill a guide pin in the center, inferior position, and this was a slightly anterior. The glenoid face was then reamed eccentrically, over the guide wire, for preparation of a 10 degree augment in the approximate 11 o'clock position. The center hole was drilled over the guidepin base down her preoperative templating. Next the glenoid vault was drilled back to a depth of 25 mm.  We tapped and then placed a 24 mm size baseplate with no additional lateralization was selected with a 25 mm  length central post.  The baseplate had a 10 degree augment.  The base plate was press-fit into the glenoid vault obtaining secure fixation. We next placed an inferior nonlocking screws for additional fixation, followed by 3 nonlocking screws at the periphery of the baseplate.  We used an over-the-top reamer, to ensure that the glenosphere would easily fit.  However, there were large osteophytes at the inferior glenoid.  At this point, we carefully protected  the axillary nerve, and proceeded to remove the inferior and posterior osteophytes to provide sufficient space for the glenosphere.  We had to very carefully used a small osteotome, in order to create enough space.  Once again, we used the over-the-top reamer and were able to sufficiently clear the baseplate circumferentially.  Next a 39 + 4 mm glenosphere was selected and impacted onto the baseplate. The center screw was tightened.    We turned attention back to the humeral side. The cut protector was removed. A starter awl was used to open the humeral canal. We next used T-handle straight reamers to ream up to an appropriate fit. We then broached starting with a size 5 broach and broaching up to a 9 which obtained an appropriate fit.  The canal appeared to be anterior.  Thus we prepared for posterior offset tray.  The broach handle was removed. We trialed with multiple size tray and polyethylene options and selected a 6 mm poly which provided good stability and range of motion without excess soft tissue tension.  Initially, there was some possible impingement due to hypertrophy of the bone at the lesser tuberosity.  Subsequently, we used rongeurs to gently remove the excess bone.  The shoulder was trialed.  There was good ROM in all planes and the shoulder was stable with no inferior translation.  The real humeral implants were opened after again confirming sizes.  The trial was removed.  The humeral component was press-fit obtaining a secure fit.  During this process, there was a long contusional split in the calcar bone.  The stem did remain secure.  We then passed a cerclage tape circumferentially around the humeral stem and the implant, passing this tape twice.  It was then tensioned, and tied.  Once again, the stem and implants remained secure.  A posterior offset tray was selected and impacted onto the stem.  A 39+6 polyethylene liner was impacted onto the stem.  The joint was reduced and thoroughly  irrigated with pulsatile lavage. Subscap was repaired back with #2 Fiberwire sutures through the eyelets on the stem. Hemostasis was obtained.  We irrigated the wound copiously before placing local antibiotic as listed above. The deltopectoral interval was reapproximated with #1 Vicryl. The subcutaneous tissues were closed with 2-0 monocryl and the skin was closed with running monocryl.     The wounds were cleaned and dried and an Aquacel dressing was placed. The drapes taken down. The arm was placed into sling with abduction pillow. Patient was awakened, extubated, and transferred to the recovery room in stable condition. There were no intraoperative complications. The sponge, needle, and attention counts were  correct at the end of the case.   Post-operative plan:  The patient will be admitted for over night observation.   We have placed a referral for PT to begin in 2-4 weeks postop.  DVT prophylaxis Aspirin 81 mg twice daily for 6 weeks.    Pain control with PRN pain medication preferring oral medicines.   Follow up plan will be scheduled in approximately  10-14 days for incision check and XR.

## 2022-09-26 ENCOUNTER — Ambulatory Visit (HOSPITAL_COMMUNITY): Payer: 59

## 2022-09-26 DIAGNOSIS — M19012 Primary osteoarthritis, left shoulder: Secondary | ICD-10-CM | POA: Diagnosis not present

## 2022-09-26 MED ORDER — OXYCODONE HCL 5 MG PO TABS: 5.0000 mg | ORAL_TABLET | ORAL | 0 refills | Status: AC | PRN

## 2022-09-26 MED ORDER — ACETAMINOPHEN 500 MG PO TABS: 1000.0000 mg | ORAL_TABLET | Freq: Three times a day (TID) | ORAL | 0 refills | Status: AC

## 2022-09-26 MED ORDER — ONDANSETRON HCL 4 MG PO TABS: 4.0000 mg | ORAL_TABLET | Freq: Three times a day (TID) | ORAL | 0 refills | Status: AC | PRN

## 2022-09-26 MED ORDER — ASPIRIN 81 MG PO TBEC: 81.0000 mg | DELAYED_RELEASE_TABLET | Freq: Two times a day (BID) | ORAL | 0 refills | Status: AC

## 2022-09-26 MED ORDER — CELECOXIB 100 MG PO CAPS: 100.0000 mg | ORAL_CAPSULE | Freq: Every day | ORAL | 0 refills | Status: AC

## 2022-09-26 NOTE — Discharge Instructions (Signed)
Luther Newhouse A. Jurney Overacker, MD MS [facility] [address] [place],  [place]  [address] Phone: [phone] Fax: [phone]    POST-OPERATIVE INSTRUCTIONS - TOTAL SHOULDER REPLACEMENT    WOUND CARE You may leave the operative dressing in place until your follow-up appointment. KEEP THE INCISIONS CLEAN AND DRY. There may be a small amount of fluid/bleeding leaking at the surgical site. This is normal after surgery.  If it fills with liquid or blood please call us immediately to change it for you. Use the provided ice machine or Ice packs as often as possible for the first 3-4 days, then as needed for pain relief.  Keep a layer of cloth or a shirt between your skin and the cooling unit to prevent frost bite as it can get very cold.  SHOWERING: - You may shower on Post-Op Day #3.  - The dressing is water resistant but do not scrub it as it may start to peel up.   - You may remove the sling for showering, but keep a water resistant pillow under the arm to keep both the  elbow and shoulder away from the body (mimicking the abduction sling).  - Gently pat the area dry.  - Do not soak the shoulder in water. Do not go swimming in the pool or ocean until your sutures are removed. - KEEP THE INCISIONS CLEAN AND DRY.  EXERCISES Wear the sling at all times except when doing your exercises. You may remove the sling for showering, but keep the arm across the chest or in a secondary sling.    Accidental/Purposeful External Rotation and shoulder flexion (reaching behind you) is to be avoided at all costs for the first month. It is ok to come out of your sling if your are sitting and have assistance for eating.  Do not lift anything heavier than 1 pound until we discuss it further in clinic. Please perform the exercises:   Elbow / Hand / Wrist  Range of Motion Exercises Grip strengthening   REGIONAL ANESTHESIA (NERVE BLOCKS) The anesthesia team may have performed a nerve block  for you if safe in the setting of your care.  This is a great tool used to minimize pain.  Typically the block may start wearing off overnight but the long acting medicine may last for 3-4 days.  The nerve block wearing off can be a challenging period but please utilize your as needed pain medications to try and manage this period.    POST-OP MEDICATIONS- Multimodal approach to pain control In general your pain will be controlled with a combination of substances.  Prescriptions unless otherwise discussed are electronically sent to your pharmacy.  This is a carefully made plan we use to minimize narcotic use.     Celebrex - Anti-inflammatory medication taken on a scheduled basis Acetaminophen - Non-narcotic pain medicine taken on a scheduled basis  Oxycodone - This is a strong narcotic, to be used only on an "as needed" basis for pain. Aspirin 81mg - This medicine is used to minimize the risk of blood clots after surgery. Zofran -  take as needed for nausea  Meloxicam/Celebrex - these are anti-inflammatory and pain relievers.  Do not take additional ibuprofen, naproxen or other NSAID while taking this medicine.   FOLLOW-UP If you develop a Fever (>101.5), Redness or Drainage from the surgical incision site, please call our office to arrange for an evaluation. Please call the office to schedule a follow-up appointment for a   wound check, 7-10 days post-operatively.  

## 2022-09-26 NOTE — Evaluation (Signed)
Physical Therapy Evaluation Patient Details Name: Derrick Dean MRN: [id number] DOB: 27-Feb-1941 Today's Date: 09/26/2022  History of Present Illness  Derrick Dean is a 82 y.o. male who presents for evaluation of left shoulder pain.  He has had progressively worsening pain in the left shoulder for several years.  He states he was scheduled for left shoulder arthroplasty approximately 2 years ago, to be completed at the [place].  At the second the last appointment with the surgeon, he elected not to proceed with surgery at that time.  He continues to have pain, decreased function.  He has tried medications, injections as well as physical therapy, with limited improvement.  He reports a history of rotator cuff repair, greater than 15 years ago.  He remains active.  He is hopeful to proceed with surgery to improve his pain and function. Status post Left reverse shoulder arthroplasty.   Clinical Impression  Patient demonstrates good return for bed mobility, transfers and ambulating in room/hallways without loss of balance without need for use of an AD.  Patient encouraged to ambulate ad lib for length of stay.  Plan:  Patient discharged from physical therapy to care of nursing for ambulation daily as tolerated for length of stay.      Recommendations for follow up therapy are one component of a multi-disciplinary discharge planning process, led by the attending physician.  Recommendations may be updated based on patient status, additional functional criteria and insurance authorization.  Follow Up Recommendations       Assistance Recommended at Discharge Set up Supervision/Assistance  Patient can return home with the following  A little help with walking and/or transfers;A little help with bathing/dressing/bathroom;Assistance with cooking/housework;Help with stairs or ramp for entrance    Equipment Recommendations None recommended by PT  Recommendations for Other Services       Functional  Status Assessment Patient has had a recent decline in their functional status and demonstrates the ability to make significant improvements in function in a reasonable and predictable amount of time.     Precautions / Restrictions Precautions Precautions: Fall Required Braces or Orthoses: Sling Restrictions Weight Bearing Restrictions: Yes LUE Weight Bearing: Non weight bearing      Mobility  Bed Mobility Overal bed mobility: Independent                  Transfers Overall transfer level: Modified independent                 General transfer comment: slightly labored    Ambulation/Gait Ambulation/Gait assistance: Modified independent (Device/Increase time) Gait Distance (Feet): 200 Feet Assistive device: None Gait Pattern/deviations: Decreased step length - right, Decreased stride length Gait velocity: slightly decreased     General Gait Details: grossly WFL with mildly decreased RLE step length 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, demonstrates good return for ambulating in room/hallways without loss of balance or need for an AD  Stairs            Wheelchair Mobility    Modified Rankin (Stroke Patients Only)       Balance Overall balance assessment: Mild deficits observed, not formally tested                                           Pertinent Vitals/Pain Pain Assessment Pain Assessment: 0-10 Pain Score: 4  Pain Location: L shoulder Pain Descriptors /  Indicators: Sore, Discomfort Pain Intervention(s): Limited activity within patient's tolerance, Monitored during session, Repositioned    Home Living Family/patient expects to be discharged to:: Private residence Living Arrangements: Alone Available Help at Discharge: Family;Personal care attendant;Available 24 hours/day Type of Home: Apartment Home Access: Level entry       Home Layout: One level Home Equipment: Agricultural consultant (2 wheels);Rollator (4  wheels);Cane - single point;Crutches;BSC/3in1;Shower seat;Grab bars - toilet;Grab bars - tub/shower;Wheelchair - manual Additional Comments: Has personal care assistant that comes PRN. Reports planning to have someone there often for help.    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 Drives. ADLs Comments: PRN assist for bathing and dressing. Assisted for IADL's.     Hand Dominance   Dominant Hand: Right    Extremity/Trunk Assessment   Upper Extremity Assessment Upper Extremity Assessment: Defer to OT evaluation LUE Deficits / Details: Immobilized on donjoy sling. Sling slightly adjusted closer to 90* Elbow flexion.    Lower Extremity Assessment Lower Extremity Assessment: Overall WFL for tasks assessed    Cervical / Trunk Assessment Cervical / Trunk Assessment: Normal  Communication   Communication: No difficulties  Cognition Arousal/Alertness: Awake/alert Behavior During Therapy: WFL for tasks assessed/performed Overall Cognitive Status: Within Functional Limits for tasks assessed                                          General Comments      Exercises     Assessment/Plan    PT Assessment All further PT needs can be met in the next venue of care  PT Problem List Decreased range of motion;Decreased activity tolerance;Decreased mobility;Decreased strength       PT Treatment Interventions      PT Goals (Current goals can be found in the Care Plan section)  Acute Rehab PT Goals Patient Stated Goal: return home with family, home aides to assist PT Goal Formulation: With patient Time For Goal Achievement: 09/26/22 Potential to Achieve Goals: Good    Frequency       Co-evaluation PT/OT/SLP Co-Evaluation/Treatment: Yes Reason for Co-Treatment: To address  functional/ADL transfers PT goals addressed during session: Mobility/safety with mobility;Balance OT goals addressed during session: ADL's and self-care       AM-PAC PT "6 Clicks" Mobility  Outcome Measure Help needed turning from your back to your side while in a flat bed without using bedrails?: None Help needed moving from lying on your back to sitting on the side of a flat bed without using bedrails?: None Help needed moving to and from a bed to a chair (including a wheelchair)?: None Help needed standing up from a chair using your arms (e.g., wheelchair or bedside chair)?: None Help needed to walk in hospital room?: None Help needed climbing 3-5 steps with a railing? : A Little 6 Click Score: 23    End of Session   Activity Tolerance: Patient tolerated treatment well Patient left: in chair;with call bell/phone within reach Nurse Communication: Mobility status PT Visit Diagnosis: Unsteadiness on feet (R26.81);Other abnormalities of gait and mobility (R26.89);Muscle weakness (generalized) (M62.81)    Time: 1610-9604 PT Time Calculation (min) (ACUTE ONLY): 20 min   Charges:   PT Evaluation $PT Eval Moderate Complexity:  1 Mod PT Treatments $Therapeutic Activity: 8-22 mins        9:30 AM, 09/26/22 Ocie Bob, MPT Physical Therapist with [facility] 336 [phone] office [phone] mobile phone

## 2022-09-26 NOTE — Progress Notes (Signed)
Patient transferred from chair to bed. Oxycodone 5mg  given once this shift for pain. He slept on and off this shift. No complaints of numbness. Good capillary refill and radial pulse noted. Continued to monitor.

## 2022-09-26 NOTE — Evaluation (Addendum)
Occupational Therapy Evaluation Patient Details Name: Derrick Dean MRN: [id number] DOB: [birth year] Today's Date: 09/26/2022   History of Present Illness Derrick Dean is a 82 y.o. male who presents for evaluation of left shoulder pain.  He has had progressively worsening pain in the left shoulder for several years.  He states he was scheduled for left shoulder arthroplasty approximately 2 years ago, to be completed at the [place].  At the second the last appointment with the surgeon, he elected not to proceed with surgery at that time.  He continues to have pain, decreased function.  He has tried medications, injections as well as physical therapy, with limited improvement.  He reports a history of rotator cuff repair, greater than 15 years ago.  He remains active.  He is hopeful to proceed with surgery to improve his pain and function. Status post Left reverse shoulder arthroplasty. (per MD)   Clinical Impression   Pt agreeable to OT and PT co-evaluation. Pt L UE immobilized in sling. Pt was able to transfer without assist and demonstrates need for intermittent assist for ADL's due to limitations of L UE. Pt has support at home and plans to get more. Handout given on how to don don joy sling. Pt is not recommended for further acute OT services and will be discharged to care of nursing staff for remaining length of stay.       Recommendations for follow up therapy are one component of a multi-disciplinary discharge planning process, led by the attending physician.  Recommendations may be updated based on patient status, additional functional criteria and insurance authorization.   Assistance Recommended at Discharge PRN  Patient can return home with the following A little help with bathing/dressing/bathroom;Assistance with cooking/housework;Assist for transportation    Functional Status Assessment  Patient has had a recent decline in their functional status and demonstrates the ability to make  significant improvements in function in a reasonable and predictable amount of time.  Equipment Recommendations  None recommended by OT           Precautions / Restrictions Precautions Precautions: Fall Required Braces or Orthoses: Sling Restrictions Weight Bearing Restrictions: Yes LUE Weight Bearing: Non weight bearing      Mobility Bed Mobility Overal bed mobility: Independent                  Transfers Overall transfer level: Modified independent Equipment used:  (donning sling)               General transfer comment: mildly unsteady      Balance Overall balance assessment: Mild deficits observed, not formally tested                                         ADL either performed or assessed with clinical judgement   ADL Overall ADL's : Needs assistance/impaired     Grooming: Set up;Modified independent;Standing   Upper Body Bathing: Set up;Minimal assistance;Sitting   Lower Body Bathing: Set up;Minimal assistance;Sitting/lateral leans   Upper Body Dressing : Set up;Minimal assistance;Sitting   Lower Body Dressing: Set up;Minimal assistance;Sitting/lateral leans   Toilet Transfer: Modified Independent;Ambulation Toilet Transfer Details (indicate cue type and reason): Simulated via ambulation in room and hall without AD.         Functional mobility during ADLs: Modified independent;Rolling walker (2 wheels)       Vision Baseline Vision/History: 1 Wears  glasses Ability to See in Adequate Light: 1 Impaired Patient Visual Report: No change from baseline Vision Assessment?: No apparent visual deficits                Pertinent Vitals/Pain Pain Assessment Pain Assessment: 0-10 Pain Score: 4  Pain Location: L shoulder Pain Descriptors / Indicators: Other (Comment) (chilling.) Pain Intervention(s): Limited activity within patient's tolerance, Monitored during session, Repositioned     Hand Dominance Right    Extremity/Trunk Assessment Upper Extremity Assessment Upper Extremity Assessment: LUE deficits/detail LUE Deficits / Details: Immobilized on donjoy sling. Sling slightly adjusted closer to 90* Elbow flexion.   Lower Extremity Assessment Lower Extremity Assessment: Defer to PT evaluation   Cervical / Trunk Assessment Cervical / Trunk Assessment: Normal   Communication Communication Communication: No difficulties   Cognition Arousal/Alertness: Awake/alert Behavior During Therapy: WFL for tasks assessed/performed Overall Cognitive Status: Within Functional Limits for tasks assessed                                                        Home Living Family/patient expects to be discharged to:: Private residence Living Arrangements: Alone Available Help at Discharge: Family;Personal care attendant;Available 24 hours/day Type of Home: Apartment Home Access: Level entry     Home Layout: One level     Bathroom Shower/Tub: Chief Strategy Officer: Standard     Home Equipment: Agricultural consultant (2 wheels);Rollator (4 wheels);Cane - single point;Crutches;BSC/3in1;Shower seat;Grab bars - toilet;Grab bars - tub/shower;Wheelchair - manual   Additional Comments: Has personal care assistant that comes PRN. Reports planning to have someone there often for help.      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 Drives. ADLs Comments: PRN assist for bathing and dressing. Assisted for IADL's.                                Co-evaluation PT/OT/SLP Co-Evaluation/Treatment: Yes Reason for Co-Treatment: To address functional/ADL transfers   OT goals addressed during session: ADL's and self-care      AM-PAC OT "6 Clicks" Daily Activity      Outcome Measure Help from another person eating meals?: None Help from another person taking care of personal grooming?: A Little Help from another person toileting, which includes using toliet, bedpan, or urinal?: None Help from another person bathing (including washing, rinsing, drying)?: A Little Help from another person to put on and taking off regular upper body clothing?: A Little   6 Click Score: 17   End of Session    Activity Tolerance: Patient tolerated treatment well Patient left: in chair;with call bell/phone within reach  OT Visit Diagnosis: Muscle weakness (generalized) (M62.81)                Time: 0981-1914 OT Time Calculation (min): 15 min Charges:  OT General Charges $OT Visit: 1 Visit OT Evaluation $OT Eval Low Complexity: 1 Low  Laverle Pillard OT, MOT   Danie Chandler 09/26/2022, 8:51 AM

## 2022-09-26 NOTE — Discharge Summary (Signed)
Patient ID: Derrick Dean MRN: [id number] DOB/AGE: 1941-03-27 82 y.o.  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  Tolerated procedure well.  Was kept for monitoring overnight for pain control and medical monitoring postop and was found to be stable for DC home the morning after surgery.  He was evaluated by PT and OT prior to discharge with no additional needs at this time.  Patient was instructed on specific activity restrictions and all questions were answered.  He was stable for DC on POD#1.    Consults: None  Significant Diagnostic Studies: No additional pertinent studies  Treatments: Surgery  Discharge Exam:  Vitals:   09/26/22 0056 09/26/22 0418  BP: 126/79 136/76  Pulse: 82 77  Resp: 19 18  Temp: 98.4 F (36.9 C) 98.2 F (36.8 C)  SpO2: 98% 100%     Alert and oriented No acute distress  Sitting upright in the   Left arm:   Straps for sling and pillow were undone. He was able to move shoulder Ice pack on shoulder without towels underneath Sensation intact in the axillary nerve distribution Sensation intact throughout the left hand Active motion intact in the AIN/PIN/U nerve distribution 2+ radial pulse      Latest Ref Rng & Units 09/25/2022   12:24 PM 09/18/2022    3:20 PM 08/31/2021    6:02 PM   CBC  WBC 4.0 - 10.5 K/uL  4.5  5.2   Hemoglobin 13.0 - 17.0 g/dL 40.9  81.1  91.4   Hematocrit 39.0 - 52.0 % 35.6  36.0  39.5   Platelets 150 - 400 K/uL  265  334      Disposition: Discharge disposition: 01-Home or Self Care        Allergies as of 09/26/2022       Reactions   Lisinopril    Made me feel funny        Medication List     STOP taking these medications    cetirizine 10 MG tablet Commonly known as: ZYRTEC   cyclobenzaprine 5 MG tablet Commonly known as: FLEXERIL   HYDROcodone-acetaminophen 5-325 MG tablet Commonly known as: NORCO/VICODIN   methocarbamol 500 MG tablet Commonly known as: ROBAXIN   naproxen 500 MG tablet Commonly known as: NAPROSYN   omeprazole 20 MG capsule Commonly known as: PRILOSEC   polyethylene glycol powder 17 GM/SCOOP powder Commonly known as: MiraLax   tamsulosin 0.4 MG Caps capsule Commonly known as: Flomax       TAKE these medications    acetaminophen 500 MG tablet Commonly known as: TYLENOL Take 2 tablets (1,000 mg total) by mouth every 8 (eight) hours for 14 days. What changed:  when to take this reasons to take this Another medication with the same  name was removed. Continue taking this medication, and follow the directions you see here.   allopurinol 100 MG tablet Commonly known as: ZYLOPRIM Take 3 tablets (300 mg total) by mouth daily. Do not start until after resolution of active gout flare What changed:  when to take this reasons to take this additional instructions   amLODipine 10 MG tablet Commonly known as: NORVASC Take 10 mg by mouth daily.   aspirin EC 81 MG tablet Take 1 tablet (81 mg total) by mouth in the morning and at bedtime. Swallow whole. What changed:  when to take this reasons to take this additional instructions   celecoxib 100 MG capsule Commonly known as: CeleBREX Take 1 capsule (100 mg total) by mouth daily for 14 days.   cholecalciferol 1000 units tablet Commonly  known as: VITAMIN D Take 1,000 Units by mouth daily.   colchicine 0.6 MG tablet Take 1 tablet (0.6 mg total) by mouth daily. Take 2 tablets initially (1.2 mg), then 1 tablet one hour later. Can repeat 1 tablet up to three times. What changed:  when to take this reasons to take this additional instructions   losartan 50 MG tablet Commonly known as: COZAAR Take 50 mg by mouth daily.   ondansetron 4 MG tablet Commonly known as: Zofran Take 1 tablet (4 mg total) by mouth every 8 (eight) hours as needed for up to 14 days for nausea or vomiting.   oxyCODONE 5 MG immediate release tablet Commonly known as: Roxicodone Take 1 tablet (5 mg total) by mouth every 4 (four) hours as needed for up to 7 days.   pravastatin 40 MG tablet Commonly known as: PRAVACHOL Take 40 mg by mouth at bedtime.   traZODone 50 MG tablet Commonly known as: DESYREL Take 50 mg by mouth at bedtime.        Follow-up Information     Oliver Barre, MD Follow up.   Specialties: Orthopedic Surgery, Sports Medicine Why: 10-14 days for suture removal/incision check Contact information: [address] [place] [address] [phone]

## 2022-09-27 ENCOUNTER — Encounter (HOSPITAL_COMMUNITY): Payer: Self-pay | Admitting: Orthopedic Surgery

## 2022-09-29 ENCOUNTER — Ambulatory Visit (INDEPENDENT_AMBULATORY_CARE_PROVIDER_SITE_OTHER): Payer: 59 | Admitting: Orthopedic Surgery

## 2022-09-29 ENCOUNTER — Encounter: Payer: Self-pay | Admitting: Orthopedic Surgery

## 2022-09-29 DIAGNOSIS — M19012 Primary osteoarthritis, left shoulder: Secondary | ICD-10-CM

## 2022-09-29 NOTE — Patient Instructions (Signed)
Sling at all times for the left arm.  Keep the incision covered at all times until follow-up  Medications as needed  Limit the use of your left arm.  Follow up in 2 weeks

## 2022-09-29 NOTE — Progress Notes (Signed)
Orthopaedic Postop Note  Assessment: Derrick Dean is a 82 y.o. male s/p Left Reverse Shoulder Arthroplasty  DOS: 09/25/2022  Plan: Mr. Derrick Dean comes to clinic today, earlier than scheduled.  His pain is better, after obtaining his pain medications.  Who presents to clinic today having removed his dressing.  He is also not in a sling.  I stressed to him the importance of remaining in a sling at all times.  He states understanding.  He was fitted for another sling today.  We also put a dressing on his left shoulder incision.  He has no numbness or tingling.  Overall, he looks good, and feels pretty good.  Nonetheless, I do not want to push the motion too early, and risk failure of the implants.    Follow-up: Return in about 11 days (around 10/10/2022).  XR at next visit: Left shoulder  Subjective:  Chief Complaint  Patient presents with   Follow-up left reverse shoulder arthroplasty    Date of surgery: 09/25/2022    History of Present Illness: Derrick Dean is a 82 y.o. male who presents following the above stated procedure.  He had left reverse shoulder arthroplasty on Monday of this week.  He contacted clinic 2 days ago, complaining of severe pain.  I contacted him via telephone yesterday, and he stated he had not yet gotten his postoperative pain medications.  I told him that he needs these prescriptions, and he should start taking the pain medications.  Since he started his medications, he is feeling better.  He states his mobility is getting better, as he is not wearing his sling.  He has removed his dressing.  He has no numbness or tingling.  Review of Systems: No fevers or chills No numbness or tingling No Chest Pain No shortness of breath   Objective: There were no vitals taken for this visit.  Physical Exam:  Alert and oriented.  No acute distress.  Evaluation left arm demonstrates no swelling.  Minimal bruising.  Dressing has been removed, but the incision is  intact.  He has intact sensation in the axillary nerve distribution.  Sensation is intact throughout the left hand.  Active motion intact throughout the left hand 2+ radial pulse.  IMAGING: I personally ordered and reviewed the following images:  No new imaging has been obtained today.   Oliver Barre, MD 09/29/2022 9:20 AM

## 2022-10-02 ENCOUNTER — Telehealth: Payer: Self-pay

## 2022-10-02 NOTE — Telephone Encounter (Signed)
Patient called wanting to know if he could take a shower. I told him that he needed to be sure to take the sling off long enough to cover the incision area and his cast in plastic, so they don't get wet.  Told him that after his shower he should put the sling back on after drying off and keep it on like he was instructed by Dr. Dallas Schimke. He agreed

## 2022-10-02 NOTE — Telephone Encounter (Signed)
Called Derrick Dean and advised him he is not able to take the sling off yet nor get that side wet in the shower until advised my Dr Jinny Blossom and [place]

## 2022-10-03 ENCOUNTER — Encounter (HOSPITAL_COMMUNITY): Payer: 59 | Admitting: Physical Therapy

## 2022-10-05 ENCOUNTER — Encounter (HOSPITAL_COMMUNITY): Payer: 59 | Admitting: Physical Therapy

## 2022-10-06 ENCOUNTER — Telehealth: Payer: Self-pay | Admitting: Radiology

## 2022-10-06 NOTE — Telephone Encounter (Signed)
Attempted to call patient, line was busy.

## 2022-10-06 NOTE — Telephone Encounter (Signed)
Patient called, left a voice mail, said that he has gotten the oxycontin, and no laxative yet.  He says he "needs a laxative or stool softener, that his bowels have done locked up and he almost died". Please call him.

## 2022-10-06 NOTE — Telephone Encounter (Signed)
Called pt and let him know that the stool softeners usually prescribed are the same that can be bought OTC. Recommended he purchase an OTC stool softener, pt asked if prune juice was ok to use let him know if that works for him it was fine. Pt verbalized understanding.

## 2022-10-10 ENCOUNTER — Ambulatory Visit (INDEPENDENT_AMBULATORY_CARE_PROVIDER_SITE_OTHER): Payer: 59 | Admitting: Orthopedic Surgery

## 2022-10-10 ENCOUNTER — Other Ambulatory Visit (INDEPENDENT_AMBULATORY_CARE_PROVIDER_SITE_OTHER): Payer: No Typology Code available for payment source

## 2022-10-10 DIAGNOSIS — Z96612 Presence of left artificial shoulder joint: Secondary | ICD-10-CM

## 2022-10-10 NOTE — Patient Instructions (Signed)
Consider Colace or MiraLAX to help with bowel movements  Continue to use the sling for another 2 weeks.  Okay to remove the sling when sitting around.  I would like you to wear the sling to bed for an additional 2 weeks.  Follow-up in 1 month.  Call with questions  Let us know if he needs some more pain medications.

## 2022-10-11 ENCOUNTER — Encounter (HOSPITAL_COMMUNITY): Payer: 59 | Admitting: Physical Therapy

## 2022-10-11 ENCOUNTER — Telehealth: Payer: Self-pay | Admitting: Orthopedic Surgery

## 2022-10-11 ENCOUNTER — Encounter: Payer: Self-pay | Admitting: Orthopedic Surgery

## 2022-10-11 MED ORDER — DOCUSATE SODIUM 100 MG PO CAPS: 100.0000 mg | ORAL_CAPSULE | Freq: Two times a day (BID) | ORAL | 0 refills | Status: DC

## 2022-10-11 MED ORDER — POLYETHYLENE GLYCOL 3350 17 G PO PACK: 17.0000 g | PACK | Freq: Every day | ORAL | 0 refills | Status: DC | PRN

## 2022-10-11 NOTE — Telephone Encounter (Signed)
CAIRNS  Patient called and left voicemail at 9:59 am wanting to know if he can get wet now.

## 2022-10-11 NOTE — Progress Notes (Signed)
Orthopaedic Postop Note  Assessment: Derrick Dean is a 82 y.o. male s/p Left Reverse Shoulder Arthroplasty  DOS: 09/25/22  Plan: Sutures were trimmed, steri strips were placed Ok to remove the abduction pillow; can stop using the sling around 4 weeks postop Physical therapy prescription and protocol provided OTC medications such as colace or miralax for constipation Anticipated progression discussed, XR reviewed in clinic Follow up 4 weeks     Follow-up: Return in about 4 weeks (around 11/07/2022).  XR at next visit: Left shoulder  Subjective:  Chief Complaint  Patient presents with   Routine Post Op    L RSA DOS 09/25/22    History of Present Illness: Derrick Dean is a 82 y.o. male who presents following the above stated procedure.  He returns to clinic doing better.  He does report some constipation, but this has resolved with prune juice.  He is taking his pain medications less frequently.  No numbness or tingling.  No effusion of the surgical incision.  He has remained in the sling.  He has yet to start physical therapy.  Review of Systems: No fevers or chills No numbness or tingling No Chest Pain No shortness of breath + constipation   Objective: There were no vitals taken for this visit.  Physical Exam:  Alert and oriented.  No acute distress.  Anterior based shoulder incision is healing well.  No surrounding erythema or drainage.  Sensation is intact in the axillary nerve distribution.  2+ radial pulse.  Sensation intact throughout the left hand.  He tolerates forward flexion to 90 degrees.  Abduction to 90 degrees.  IMAGING: I personally ordered and reviewed the following images:  XR of the Left obtained in clinic today and demonstrates a reverse shoulder arthroplasty with implants in good position.  No evidence of acute injury or subsidence of implants.  No fracture within the calcar is appreciated.  Impression: Left reverse shoulder arthroplasty  in good position   Oliver Barre, MD 10/11/2022 8:10 AM

## 2022-10-11 NOTE — Telephone Encounter (Signed)
Called pt and let him know that he can shower and allow soap and water to run over the incision but do not rub or use anything to clean the area. Pt verbalized understanding. Pt would also like to have a stool softener sent to his pharmacy, please assist.

## 2022-10-13 ENCOUNTER — Telehealth: Payer: Self-pay

## 2022-10-13 NOTE — Telephone Encounter (Signed)
Patient left message on voicemail asking for a return call. I returned patient's call got no answer and could not leave message because his mailbox is full.

## 2022-10-16 ENCOUNTER — Telehealth: Payer: Self-pay | Admitting: Orthopedic Surgery

## 2022-10-16 NOTE — Telephone Encounter (Signed)
CAIRNS,   Patient called and left a voicemail stating he needs his pain medicine.  He is in a lot of pain   Please call him back at [phone]

## 2022-10-16 NOTE — Telephone Encounter (Signed)
I called back to discuss, went to VM, unable to leave message- mailbox full.

## 2022-10-17 ENCOUNTER — Encounter (HOSPITAL_COMMUNITY): Payer: 59 | Admitting: Physical Therapy

## 2022-10-17 ENCOUNTER — Telehealth: Payer: Self-pay | Admitting: Orthopedic Surgery

## 2022-10-17 MED ORDER — OXYCODONE HCL 5 MG PO TABS: 5.0000 mg | ORAL_TABLET | Freq: Four times a day (QID) | ORAL | 0 refills | Status: AC | PRN

## 2022-10-17 NOTE — Telephone Encounter (Signed)
Patient called again at 1:20 left a voicemail stating he is in a lot of pain, and he does not have any pain medicine.   He want's Dr. Dallas Schimke to call him.   Phone number [phone]

## 2022-10-17 NOTE — Telephone Encounter (Signed)
Dr. Dallas Schimke pt - pt lvm stating he would like some pain meds that he's in chronic pain.

## 2022-10-18 NOTE — Telephone Encounter (Signed)
Attempted to call pt, VM at home # and mailbox full on mobile #.

## 2022-10-20 ENCOUNTER — Encounter (HOSPITAL_COMMUNITY): Payer: 59

## 2022-10-23 ENCOUNTER — Encounter (HOSPITAL_COMMUNITY): Payer: 59

## 2022-10-25 ENCOUNTER — Encounter (HOSPITAL_COMMUNITY): Payer: Self-pay | Admitting: Occupational Therapy

## 2022-10-25 ENCOUNTER — Encounter (HOSPITAL_COMMUNITY): Payer: 59

## 2022-10-25 ENCOUNTER — Ambulatory Visit (HOSPITAL_COMMUNITY): Payer: 59 | Attending: Orthopedic Surgery | Admitting: Occupational Therapy

## 2022-10-25 DIAGNOSIS — M25512 Pain in left shoulder: Secondary | ICD-10-CM | POA: Diagnosis not present

## 2022-10-25 DIAGNOSIS — R29898 Other symptoms and signs involving the musculoskeletal system: Secondary | ICD-10-CM | POA: Insufficient documentation

## 2022-10-25 DIAGNOSIS — Z96612 Presence of left artificial shoulder joint: Secondary | ICD-10-CM | POA: Diagnosis not present

## 2022-10-25 DIAGNOSIS — M25612 Stiffness of left shoulder, not elsewhere classified: Secondary | ICD-10-CM | POA: Insufficient documentation

## 2022-10-25 NOTE — Therapy (Signed)
OUTPATIENT OCCUPATIONAL THERAPY ORTHO EVALUATION  Patient Name: Derrick Dean MRN: [id number] DOB:1940/06/08, 82 y.o., male Today's Date: 10/26/2022   END OF SESSION:  OT End of Session - 10/26/22 1708     Visit Number 1    Number of Visits 17    Date for OT Re-Evaluation 12/22/22    Authorization Type [employer] Dual Complete    OT Start Time 1534    OT Stop Time 1607    OT Time Calculation (min) 33 min    Activity Tolerance Patient tolerated treatment well    Behavior During Therapy WFL for tasks assessed/performed             Past Medical History:  Diagnosis Date   Anemia    Appendicitis    Arthritis    B12 nutritional deficiency    on supplement   GERD (gastroesophageal reflux disease)    Gout    Hepatitis C infection    not treated - pending appointment in april 2015   History of heart artery stent    Hypertension    Presbyopia    wears bifocals   Trouble in sleeping    Past Surgical History:  Procedure Laterality Date   APPENDECTOMY     CARDIAC CATHETERIZATION     7 YRS AGO W/STENT   CHOLECYSTECTOMY     Left shoulder surgery     REVERSE SHOULDER ARTHROPLASTY Left 09/25/2022   Procedure: LEFT REVERSE SHOULDER ARTHROPLASTY;  Surgeon: Oliver Barre, MD;  Location: [facility];  Service: Orthopedics;  Laterality: Left;  RNFA NEEDED   Right Hip Replacement Right 01/27/2020   Right Shoulder surgery     TOTAL KNEE ARTHROPLASTY Bilateral    Patient Active Problem List   Diagnosis Date Noted   Arthritis of left glenohumeral joint 09/25/2022   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   Other abnormalities of gait and mobility 01/03/2021   Gout 01/03/2021   Posttraumatic stress disorder 01/03/2021   Gastroesophageal reflux disease 01/03/2021   Hyperlipidemia 01/03/2021   Vitamin B12 deficiency 01/03/2021   Shoulder pain 01/03/2021   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   Coronary atherosclerosis 01/03/2021   History of total right hip replacement  09/10/2020   Chronic midline low back pain without sciatica 05/24/2018   Lumbar spondylosis 05/24/2018   Pain syndrome, chronic 06/15/2017   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/15/2017   Sprain of right wrist 10/31/2016   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   S/P ORIF (open reduction internal fixation) fracture 05/05/2016   Closed fracture of right distal radius 04/18/2016   Common cold 09/25/2013   CAD S/P percutaneous coronary angioplasty 09/25/2013   HCV (hepatitis C virus) 05/15/2013   Cholelithiasis 05/15/2013   Abdominal pain, epigastric 05/15/2013   Pancreatitis, acute 05/13/2013   Unspecified essential hypertension 05/13/2013   Transaminasemia 05/13/2013   Chest pain 05/12/2013    PCP: Jeanice Lim [place] REFERRING PROVIDER: Thane Edu, MD  ONSET DATE: 09/26/22  REFERRING DIAG: L reverse total shoulder replacement  THERAPY DIAG:  Acute pain of left shoulder  Stiffness of left shoulder, not elsewhere classified  Other symptoms and signs involving the musculoskeletal system  Rationale for Evaluation and Treatment: Rehabilitation  SUBJECTIVE:   SUBJECTIVE STATEMENT: "I want this to be over with" Pt accompanied by: self  PERTINENT HISTORY: Pt has had severe arthritis for years. No other PMH noted in chart.   PRECAUTIONS: Shoulder  WEIGHT BEARING RESTRICTIONS: Yes >2lbs  PAIN:  Are you having pain? Yes: NPRS scale: 5/10 Pain  location: anterior shoulder girdel Pain description: recurring pain Aggravating factors: movement Relieving factors: sleep  FALLS: Has patient fallen in last 6 months? Yes. Number of falls frequently  PLOF: Independent  PATIENT GOALS: To get back to where I was  NEXT MD VISIT: 11/07/22  OBJECTIVE:   HAND DOMINANCE: Right  ADLs: Overall ADLs: Mod assist for dressing and bathing, difficulty with cooking and cleaning due to weakness and protocol  FUNCTIONAL OUTCOME MEASURES: FOTO: 40.12  UPPER EXTREMITY ROM:       Assessed in  supine, er/IR adducted  Passive ROM Left eval  Shoulder flexion 118  Shoulder abduction 87  Shoulder internal rotation 90  Shoulder external rotation 27  (Blank rows = not tested)    UPPER EXTREMITY MMT:     Assessed in seating, er/IR adducted  MMT Left eval  Shoulder flexion   Shoulder abduction   Shoulder internal rotation   Shoulder external rotation   (Blank rows = not tested)  SENSATION: WFL  EDEMA: No swelling noted  OBSERVATIONS: Moderate to severe fascial restrictions    TODAY'S TREATMENT:                                                                                                                              DATE: 10/25/22: Evaluation Only    PATIENT EDUCATION: Education details: Table Slides and Pendulums Person educated: Patient Education method: Explanation, Demonstration, and Handouts Education comprehension: verbalized understanding and returned demonstration  HOME EXERCISE PROGRAM: 7/11: Tables slides and Pendulums  GOALS: Goals reviewed with patient? Yes   SHORT TERM GOALS: Target date: 11/21/22  Pt will be provided with and educated on HEP to improve mobility in LUE required for use during ADL completion.   Goal status: INITIAL  2.  Pt will increase :UE P/ROM by 50 degrees to improve ability to use LUE during dressing tasks with minimal compensatory techniques.   Goal status: INITIAL  3.  Pt will increase LUE strength to 3+/5 to improve ability to reach for items at waist to chest height during bathing and grooming tasks.   Goal status: INITIAL   LONG TERM GOALS: Target date: 12/22/22  Pt will decrease pain in LUE to 3/10 or less to improve ability to sleep for 2+ consecutive hours without waking due to pain.   Goal status: INITIAL  2.  Pt will decrease LUE fascial restrictions to min amounts or less to improve mobility required for functional reaching tasks.   Goal status: INITIAL  3.  Pt will increase LUE A/ROM by 50 degrees  to improve ability to use LUE when reaching overhead or behind back during dressing and bathing tasks.   Goal status: INITIAL  4.  Pt will increase LUE strength to 4+/5 or greater to improve ability to use LLUE when lifting or carrying items during meal preparation/housework/yardwork tasks.   Goal status: INITIAL  5.  Pt will return to highest level of function using LUE as non-dominant  during functional task completion.   Goal status: INITIAL   ASSESSMENT:  CLINICAL IMPRESSION: Patient is a 82 y.o. male who was seen today for occupational therapy evaluation for s/p L reverse total shoulder replacement. Pt presents with increased pain and fascial restrictions, decreased ROM, strength, and functional use of the LUE.   PERFORMANCE DEFICITS: in functional skills including in functional skills including ADLs, IADLs, coordination, tone, ROM, strength, pain, fascial restrictions, muscle spasms, and UE functional use.  IMPAIRMENTS: are limiting patient from ADLs, IADLs, rest and sleep, work, leisure, and social participation.   COMORBIDITIES: has no other co-morbidities that affects occupational performance. Patient will benefit from skilled OT to address above impairments and improve overall function.  MODIFICATION OR ASSISTANCE TO COMPLETE EVALUATION: No modification of tasks or assist necessary to complete an evaluation.  OT OCCUPATIONAL PROFILE AND HISTORY: Problem focused assessment: Including review of records relating to presenting problem.  CLINICAL DECISION MAKING: LOW - limited treatment options, no task modification necessary  REHAB POTENTIAL: Good  EVALUATION COMPLEXITY: Low      PLAN:  OT FREQUENCY: 2x/week  OT DURATION: 8 weeks  PLANNED INTERVENTIONS: self care/ADL training, therapeutic exercise, therapeutic activity, neuromuscular re-education, manual therapy, passive range of motion, splinting, electrical stimulation, ultrasound, moist heat, cryotherapy,  patient/family education, and DME and/or AE instructions  RECOMMENDED OTHER SERVICES: N/A  CONSULTED AND AGREED WITH PLAN OF CARE: Patient  PLAN FOR NEXT SESSION: Manual Therapy, P/ROM, Table Slides, thumb tacs, low level wall washes   Trish Mage, OTR/L [facility] [phone] Derrick Dean Rosemarie Beath, OT 10/26/2022, 5:13 PM

## 2022-10-25 NOTE — Patient Instructions (Signed)

## 2022-10-30 ENCOUNTER — Encounter (HOSPITAL_COMMUNITY): Payer: 59

## 2022-11-01 ENCOUNTER — Encounter (HOSPITAL_COMMUNITY): Payer: 59

## 2022-11-02 ENCOUNTER — Ambulatory Visit: Payer: 59 | Admitting: Psychiatry

## 2022-11-06 ENCOUNTER — Encounter (HOSPITAL_COMMUNITY): Payer: 59

## 2022-11-08 ENCOUNTER — Encounter (HOSPITAL_COMMUNITY): Payer: 59

## 2022-11-14 ENCOUNTER — Ambulatory Visit (HOSPITAL_COMMUNITY): Payer: 59 | Admitting: Occupational Therapy

## 2022-11-14 ENCOUNTER — Encounter (HOSPITAL_COMMUNITY): Payer: Self-pay | Admitting: Occupational Therapy

## 2022-11-14 DIAGNOSIS — M25612 Stiffness of left shoulder, not elsewhere classified: Secondary | ICD-10-CM

## 2022-11-14 DIAGNOSIS — M25512 Pain in left shoulder: Secondary | ICD-10-CM | POA: Diagnosis not present

## 2022-11-14 DIAGNOSIS — R29898 Other symptoms and signs involving the musculoskeletal system: Secondary | ICD-10-CM

## 2022-11-14 NOTE — Therapy (Signed)
OUTPATIENT OCCUPATIONAL THERAPY ORTHO TREATMENT NOTE  Patient Name: Derrick Dean MRN: [id number] DOB:1940-06-27, 82 y.o., male Today's Date: 11/15/2022   END OF SESSION:  OT End of Session - 11/14/22 1515     Visit Number 2    Number of Visits 17    Date for OT Re-Evaluation 12/22/22    Authorization Type [employer] Dual Complete    OT Start Time 1439    OT Stop Time 1518    OT Time Calculation (min) 39 min    Activity Tolerance Patient tolerated treatment well    Behavior During Therapy WFL for tasks assessed/performed             Past Medical History:  Diagnosis Date   Anemia    Appendicitis    Arthritis    B12 nutritional deficiency    on supplement   GERD (gastroesophageal reflux disease)    Gout    Hepatitis C infection    not treated - pending appointment in april 2015   History of heart artery stent    Hypertension    Presbyopia    wears bifocals   Trouble in sleeping    Past Surgical History:  Procedure Laterality Date   APPENDECTOMY     CARDIAC CATHETERIZATION     7 YRS AGO W/STENT   CHOLECYSTECTOMY     Left shoulder surgery     REVERSE SHOULDER ARTHROPLASTY Left 09/25/2022   Procedure: LEFT REVERSE SHOULDER ARTHROPLASTY;  Surgeon: Oliver Barre, MD;  Location: [facility];  Service: Orthopedics;  Laterality: Left;  RNFA NEEDED   Right Hip Replacement Right 01/27/2020   Right Shoulder surgery     TOTAL KNEE ARTHROPLASTY Bilateral    Patient Active Problem List   Diagnosis Date Noted   Arthritis of left glenohumeral joint 09/25/2022   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   Other abnormalities of gait and mobility 01/03/2021   Gout 01/03/2021   Posttraumatic stress disorder 01/03/2021   Gastroesophageal reflux disease 01/03/2021   Hyperlipidemia 01/03/2021   Vitamin B12 deficiency 01/03/2021   Shoulder pain 01/03/2021   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   Coronary atherosclerosis 01/03/2021   History of total right hip  replacement 09/10/2020   Chronic midline low back pain without sciatica 05/24/2018   Lumbar spondylosis 05/24/2018   Pain syndrome, chronic 06/15/2017   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/15/2017   Sprain of right wrist 10/31/2016   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   S/P ORIF (open reduction internal fixation) fracture 05/05/2016   Closed fracture of right distal radius 04/18/2016   Common cold 09/25/2013   CAD S/P percutaneous coronary angioplasty 09/25/2013   HCV (hepatitis C virus) 05/15/2013   Cholelithiasis 05/15/2013   Abdominal pain, epigastric 05/15/2013   Pancreatitis, acute 05/13/2013   Unspecified essential hypertension 05/13/2013   Transaminasemia 05/13/2013   Chest pain 05/12/2013    PCP: Jeanice Lim [place] REFERRING PROVIDER: Thane Edu, MD  ONSET DATE: 09/26/22  REFERRING DIAG: L reverse total shoulder replacement  THERAPY DIAG:  Acute pain of left shoulder  Stiffness of left shoulder, not elsewhere classified  Other symptoms and signs involving the musculoskeletal system  Rationale for Evaluation and Treatment: Rehabilitation  SUBJECTIVE:   SUBJECTIVE STATEMENT: "Everything hurts" Pt accompanied by: self  PERTINENT HISTORY: Pt has had severe arthritis for years. No other PMH noted in chart.   PRECAUTIONS: Shoulder  WEIGHT BEARING RESTRICTIONS: Yes >2lbs  PAIN:  Are you having pain? Yes: NPRS scale: 6/10 Pain location: anterior shoulder girdel  Pain description: recurring pain Aggravating factors: movement Relieving factors: sleep  FALLS: Has patient fallen in last 6 months? Yes. Number of falls frequently  PLOF: Independent  PATIENT GOALS: To get back to where I was  NEXT MD VISIT: 11/07/22  OBJECTIVE:   HAND DOMINANCE: Right  ADLs: Overall ADLs: Mod assist for dressing and bathing, difficulty with cooking and cleaning due to weakness and protocol  FUNCTIONAL OUTCOME MEASURES: FOTO: 40.12  UPPER EXTREMITY ROM:       Assessed in  supine, er/IR adducted  Passive ROM Left eval  Shoulder flexion 118  Shoulder abduction 87  Shoulder internal rotation 90  Shoulder external rotation 27  (Blank rows = not tested)    UPPER EXTREMITY MMT:     Assessed in seating, er/IR adducted  MMT Left eval  Shoulder flexion   Shoulder abduction   Shoulder internal rotation   Shoulder external rotation   (Blank rows = not tested)  SENSATION: WFL  EDEMA: No swelling noted  OBSERVATIONS: Moderate to severe fascial restrictions    TODAY'S TREATMENT:                                                                                                                              DATE:   11/14/22 -Manual Therapy: myofascial release and trigger point applied to biceps, deltoid, trapezius, and scapular region in order to reduce fascial restrictions and pain, as well as improving ROM.  -P/ROM: supine, flexion, abduction, horizontal abduction, er/IR, x10 -Ball Rolls: flexion, abduction, x12 -Low Level Wall Washes: 2x60" -Thumb Tacs: 1x60"   PATIENT EDUCATION: Education details: Continue HEP Person educated: Patient Education method: Explanation, Demonstration, and Handouts Education comprehension: verbalized understanding and returned demonstration  HOME EXERCISE PROGRAM: 7/11: Tables slides and Pendulums  GOALS: Goals reviewed with patient? Yes   SHORT TERM GOALS: Target date: 11/21/22  Pt will be provided with and educated on HEP to improve mobility in LUE required for use during ADL completion.   Goal status: IN PROGRESS  2.  Pt will increase :UE P/ROM by 50 degrees to improve ability to use LUE during dressing tasks with minimal compensatory techniques.   Goal status: IN PROGRESS  3.  Pt will increase LUE strength to 3+/5 to improve ability to reach for items at waist to chest height during bathing and grooming tasks.   Goal status: IN PROGRESS   LONG TERM GOALS: Target date: 12/22/22  Pt will decrease pain  in LUE to 3/10 or less to improve ability to sleep for 2+ consecutive hours without waking due to pain.   Goal status: IN PROGRESS  2.  Pt will decrease LUE fascial restrictions to min amounts or less to improve mobility required for functional reaching tasks.   Goal status: IN PROGRESS  3.  Pt will increase LUE A/ROM by 50 degrees to improve ability to use LUE when reaching overhead or behind back during dressing and bathing tasks.   Goal status:  IN PROGRESS  4.  Pt will increase LUE strength to 4+/5 or greater to improve ability to use LLUE when lifting or carrying items during meal preparation/housework/yardwork tasks.   Goal status: IN PROGRESS  5.  Pt will return to highest level of function using LUE as non-dominant during functional task completion.   Goal status: IN PROGRESS   ASSESSMENT:  CLINICAL IMPRESSION: Pt presenting to 1st therapy session with increased fascial restrictions and pain, however motivated to push through and do what he can to get better. OT providing reminders for pt to take it slow and follow only provided HEP, as to not injure the shoulder or cause increased pain. Session focused on passive ROM and low levels of mobility, where he presented with increased stiffness and limitations in ROM. Verbal and tactile cuing provided with hands off tasks, for positioning and technique.    PERFORMANCE DEFICITS: in functional skills including in functional skills including ADLs, IADLs, coordination, tone, ROM, strength, pain, fascial restrictions, muscle spasms, and UE functional use.   PLAN:  OT FREQUENCY: 2x/week  OT DURATION: 8 weeks  PLANNED INTERVENTIONS: self care/ADL training, therapeutic exercise, therapeutic activity, neuromuscular re-education, manual therapy, passive range of motion, splinting, electrical stimulation, ultrasound, moist heat, cryotherapy, patient/family education, and DME and/or AE instructions  RECOMMENDED OTHER SERVICES:  N/A  CONSULTED AND AGREED WITH PLAN OF CARE: Patient  PLAN FOR NEXT SESSION: Manual Therapy, P/ROM, Table Slides, thumb tacs, low level wall washes   Trish Mage, OTR/L [facility] [phone] Samson Ralph Rosemarie Beath, OT 11/15/2022, 3:52 PM

## 2022-11-16 ENCOUNTER — Encounter (HOSPITAL_COMMUNITY): Payer: Self-pay | Admitting: Occupational Therapy

## 2022-11-16 ENCOUNTER — Ambulatory Visit (HOSPITAL_COMMUNITY): Payer: 59 | Attending: Orthopedic Surgery | Admitting: Occupational Therapy

## 2022-11-16 DIAGNOSIS — M25612 Stiffness of left shoulder, not elsewhere classified: Secondary | ICD-10-CM | POA: Insufficient documentation

## 2022-11-16 DIAGNOSIS — M25512 Pain in left shoulder: Secondary | ICD-10-CM | POA: Diagnosis present

## 2022-11-16 DIAGNOSIS — R29898 Other symptoms and signs involving the musculoskeletal system: Secondary | ICD-10-CM | POA: Diagnosis present

## 2022-11-16 NOTE — Therapy (Signed)
OUTPATIENT OCCUPATIONAL THERAPY ORTHO TREATMENT NOTE  Patient Name: Derrick Dean MRN: [id number] DOB:1940-08-20, 82 y.o., male Today's Date: 11/17/2022   END OF SESSION:  OT End of Session - 11/16/22 1515     Visit Number 3    Number of Visits 17    Date for OT Re-Evaluation 12/22/22    Authorization Type [employer] Dual Complete    OT Start Time 1438    OT Stop Time 1516    OT Time Calculation (min) 38 min    Activity Tolerance Patient tolerated treatment well    Behavior During Therapy WFL for tasks assessed/performed             Past Medical History:  Diagnosis Date   Anemia    Appendicitis    Arthritis    B12 nutritional deficiency    on supplement   GERD (gastroesophageal reflux disease)    Gout    Hepatitis C infection    not treated - pending appointment in april 2015   History of heart artery stent    Hypertension    Presbyopia    wears bifocals   Trouble in sleeping    Past Surgical History:  Procedure Laterality Date   APPENDECTOMY     CARDIAC CATHETERIZATION     7 YRS AGO W/STENT   CHOLECYSTECTOMY     Left shoulder surgery     REVERSE SHOULDER ARTHROPLASTY Left 09/25/2022   Procedure: LEFT REVERSE SHOULDER ARTHROPLASTY;  Surgeon: Oliver Barre, MD;  Location: [facility];  Service: Orthopedics;  Laterality: Left;  RNFA NEEDED   Right Hip Replacement Right 01/27/2020   Right Shoulder surgery     TOTAL KNEE ARTHROPLASTY Bilateral    Patient Active Problem List   Diagnosis Date Noted   Arthritis of left glenohumeral joint 09/25/2022   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   Other abnormalities of gait and mobility 01/03/2021   Gout 01/03/2021   Posttraumatic stress disorder 01/03/2021   Gastroesophageal reflux disease 01/03/2021   Hyperlipidemia 01/03/2021   Vitamin B12 deficiency 01/03/2021   Shoulder pain 01/03/2021   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   Coronary atherosclerosis 01/03/2021   History of total right hip replacement  09/10/2020   Chronic midline low back pain without sciatica 05/24/2018   Lumbar spondylosis 05/24/2018   Pain syndrome, chronic 06/15/2017   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/15/2017   Sprain of right wrist 10/31/2016   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   S/P ORIF (open reduction internal fixation) fracture 05/05/2016   Closed fracture of right distal radius 04/18/2016   Common cold 09/25/2013   CAD S/P percutaneous coronary angioplasty 09/25/2013   HCV (hepatitis C virus) 05/15/2013   Cholelithiasis 05/15/2013   Abdominal pain, epigastric 05/15/2013   Pancreatitis, acute 05/13/2013   Unspecified essential hypertension 05/13/2013   Transaminasemia 05/13/2013   Chest pain 05/12/2013    PCP: Jeanice Lim [place] REFERRING PROVIDER: Thane Edu, MD  ONSET DATE: 09/26/22  REFERRING DIAG: L reverse total shoulder replacement  THERAPY DIAG:  Acute pain of left shoulder  Stiffness of left shoulder, not elsewhere classified  Other symptoms and signs involving the musculoskeletal system  Rationale for Evaluation and Treatment: Rehabilitation  SUBJECTIVE:   SUBJECTIVE STATEMENT: "I was really sore after last session" Pt accompanied by: self  PERTINENT HISTORY: Pt has had severe arthritis for years. No other PMH noted in chart.   PRECAUTIONS: Shoulder  WEIGHT BEARING RESTRICTIONS: Yes >2lbs  PAIN:  Are you having pain? Yes: NPRS scale: 8/10  Pain location: anterior shoulder girdel Pain description: recurring pain Aggravating factors: movement Relieving factors: sleep  FALLS: Has patient fallen in last 6 months? Yes. Number of falls frequently  PLOF: Independent  PATIENT GOALS: To get back to where I was  NEXT MD VISIT: 11/07/22  OBJECTIVE:   HAND DOMINANCE: Right  ADLs: Overall ADLs: Mod assist for dressing and bathing, difficulty with cooking and cleaning due to weakness and protocol  FUNCTIONAL OUTCOME MEASURES: FOTO: 40.12  UPPER EXTREMITY ROM:        Assessed in supine, er/IR adducted  Passive ROM Left eval  Shoulder flexion 118  Shoulder abduction 87  Shoulder internal rotation 90  Shoulder external rotation 27  (Blank rows = not tested)    UPPER EXTREMITY MMT:     Assessed in seating, er/IR adducted  MMT Left eval  Shoulder flexion   Shoulder abduction   Shoulder internal rotation   Shoulder external rotation   (Blank rows = not tested)  SENSATION: WFL  EDEMA: No swelling noted  OBSERVATIONS: Moderate to severe fascial restrictions    TODAY'S TREATMENT:                                                                                                                              DATE:   11/16/22 -Manual Therapy: myofascial release and trigger point applied to biceps, deltoid, trapezius, and scapular region in order to reduce fascial restrictions and pain, as well as improving ROM.  -P/ROM: supine, flexion, abduction, horizontal abduction, er/IR, x10 -Wall Slides: flexion, abduction, x10 -Low Level Wall Washes: 2x60" -Pulleys: Flexion and abduction, x60" each  11/14/22 -Manual Therapy: myofascial release and trigger point applied to biceps, deltoid, trapezius, and scapular region in order to reduce fascial restrictions and pain, as well as improving ROM.  -P/ROM: supine, flexion, abduction, horizontal abduction, er/IR, x10 -Ball Rolls: flexion, abduction, x12 -Low Level Wall Washes: 2x60" -Thumb Tacs: 1x60"   PATIENT EDUCATION: Education details: Continue HEP Person educated: Patient Education method: Explanation, Demonstration, and Handouts Education comprehension: verbalized understanding and returned demonstration  HOME EXERCISE PROGRAM: 7/11: Tables slides and Pendulums  GOALS: Goals reviewed with patient? Yes   SHORT TERM GOALS: Target date: 11/21/22  Pt will be provided with and educated on HEP to improve mobility in LUE required for use during ADL completion.   Goal status: IN  PROGRESS  2.  Pt will increase :UE P/ROM by 50 degrees to improve ability to use LUE during dressing tasks with minimal compensatory techniques.   Goal status: IN PROGRESS  3.  Pt will increase LUE strength to 3+/5 to improve ability to reach for items at waist to chest height during bathing and grooming tasks.   Goal status: IN PROGRESS   LONG TERM GOALS: Target date: 12/22/22  Pt will decrease pain in LUE to 3/10 or less to improve ability to sleep for 2+ consecutive hours without waking due to pain.   Goal status: IN PROGRESS  2.  Pt will decrease LUE fascial restrictions to min amounts or less to improve mobility required for functional reaching tasks.   Goal status: IN PROGRESS  3.  Pt will increase LUE A/ROM by 50 degrees to improve ability to use LUE when reaching overhead or behind back during dressing and bathing tasks.   Goal status: IN PROGRESS  4.  Pt will increase LUE strength to 4+/5 or greater to improve ability to use LLUE when lifting or carrying items during meal preparation/housework/yardwork tasks.   Goal status: IN PROGRESS  5.  Pt will return to highest level of function using LUE as non-dominant during functional task completion.   Goal status: IN PROGRESS   ASSESSMENT:  CLINICAL IMPRESSION: This session pt reporting increased pain and soreness with P/ROM and manual techniques. He has moderate restrictions along his biceps and trapezius and minimal restrictions in the axillary region and scapularis region. Pt continuing to ask about what all he can do at home and then starts swinging his arm around stating, "I've been doing this and I can move it more", despite OT educating pt on the importance of following his protocol and completing slow and controlled movements. Verbal and tactile cuing provided throughout for positioning and technique.   PERFORMANCE DEFICITS: in functional skills including in functional skills including ADLs, IADLs, coordination, tone,  ROM, strength, pain, fascial restrictions, muscle spasms, and UE functional use.   PLAN:  OT FREQUENCY: 2x/week  OT DURATION: 8 weeks  PLANNED INTERVENTIONS: self care/ADL training, therapeutic exercise, therapeutic activity, neuromuscular re-education, manual therapy, passive range of motion, splinting, electrical stimulation, ultrasound, moist heat, cryotherapy, patient/family education, and DME and/or AE instructions  RECOMMENDED OTHER SERVICES: N/A  CONSULTED AND AGREED WITH PLAN OF CARE: Patient  PLAN FOR NEXT SESSION: Manual Therapy, P/ROM, Table Slides, thumb tacs, low level wall washes   Trish Mage, OTR/L [facility] [phone] Makynzi [place] Rosemarie Beath, OT 11/17/2022, 3:49 PM

## 2022-11-21 ENCOUNTER — Ambulatory Visit (INDEPENDENT_AMBULATORY_CARE_PROVIDER_SITE_OTHER): Payer: 59 | Admitting: Orthopedic Surgery

## 2022-11-21 ENCOUNTER — Encounter: Payer: Self-pay | Admitting: Orthopedic Surgery

## 2022-11-21 ENCOUNTER — Other Ambulatory Visit (INDEPENDENT_AMBULATORY_CARE_PROVIDER_SITE_OTHER): Payer: No Typology Code available for payment source

## 2022-11-21 DIAGNOSIS — M19012 Primary osteoarthritis, left shoulder: Secondary | ICD-10-CM

## 2022-11-21 DIAGNOSIS — M160 Bilateral primary osteoarthritis of hip: Secondary | ICD-10-CM | POA: Insufficient documentation

## 2022-11-21 MED ORDER — TRAMADOL HCL 50 MG PO TABS: 50.0000 mg | ORAL_TABLET | Freq: Three times a day (TID) | ORAL | 0 refills | Status: DC | PRN

## 2022-11-21 NOTE — Progress Notes (Signed)
Orthopaedic Postop Note  Assessment: Derrick Dean is a 82 y.o. male s/p Left Reverse Shoulder Arthroplasty  DOS: 09/25/22  Plan: Mr. Mcfarlen is getting better.  He has mostly been doing exercises on his own.  He has okay range of motion, but may be developing some stiffness.  We discussed this in clinic today.  I urged him to continue to push his range of motion, as part of his recovery.  He is still early, and I anticipate that he will continue to get better.  He states understanding.  I provided him with a prescription for tramadol.  Keep working on motion, per the protocol.  I will see him back in 1 month.    Follow-up: Return in about 4 weeks (around 12/19/2022).  XR at next visit: Left shoulder  Subjective:  Chief Complaint  Patient presents with   Post-op Follow-up    09/25/22 left shoulder surgery reverse shoulder replacement states sore and decreased motion, can't move shoulder to wash himself     History of Present Illness: Derrick Dean is a 82 y.o. male who presents following the above stated procedure.  Surgery was approximately 2 months ago.  He is slowly getting better.  He notes some stiffness and pain in the left shoulder.  He continues to have decreased function.  He has had only a few therapy sessions and states that they have been painful.  No numbness or tingling.  No issues with the surgical incision.   Review of Systems: No fevers or chills No numbness or tingling No Chest Pain No shortness of breath + constipation   Objective: There were no vitals taken for this visit.  Physical Exam:  Alert and oriented.  No acute distress.  Anterior based shoulder incision is healing well.  No surrounding erythema or drainage.  Sensation is intact in the axillary nerve distribution.  2+ radial pulse.  Sensation intact throughout the left hand.  He tolerates forward flexion to 120 degrees.  Abduction to 90 degrees.  IMAGING: I personally ordered and  reviewed the following images:  X-rays left shoulder were obtained in clinic today.  No acute injuries noted.  Well-positioned reverse shoulder arthroplasty.  No fractures or subsidence around the implants.  Unchanged alignment overall.  Glenohumeral joint is reduced.  No bony lesions.  Impression: Left reverse shoulder arthroplasty in stable position   Oliver Barre, MD 11/21/2022 4:26 PM

## 2022-11-27 ENCOUNTER — Encounter (HOSPITAL_COMMUNITY): Payer: Self-pay | Admitting: Occupational Therapy

## 2022-11-27 ENCOUNTER — Ambulatory Visit (HOSPITAL_COMMUNITY): Payer: 59 | Admitting: Occupational Therapy

## 2022-11-27 DIAGNOSIS — M25612 Stiffness of left shoulder, not elsewhere classified: Secondary | ICD-10-CM

## 2022-11-27 DIAGNOSIS — M25512 Pain in left shoulder: Secondary | ICD-10-CM

## 2022-11-27 DIAGNOSIS — R29898 Other symptoms and signs involving the musculoskeletal system: Secondary | ICD-10-CM

## 2022-11-27 NOTE — Therapy (Unsigned)
OUTPATIENT OCCUPATIONAL THERAPY ORTHO TREATMENT NOTE  Patient Name: Derrick Dean MRN: [id number] DOB:[birth year], 82 y.o., male Today's Date: 11/28/2022   END OF SESSION:  OT End of Session - 11/27/22 1430     Visit Number 4    Number of Visits 17    Date for OT Re-Evaluation 12/22/22    Authorization Type [employer] Dual Complete    OT Start Time 1351    OT Stop Time 1430    OT Time Calculation (min) 39 min    Activity Tolerance Patient tolerated treatment well    Behavior During Therapy WFL for tasks assessed/performed             Past Medical History:  Diagnosis Date   Anemia    Appendicitis    Arthritis    B12 nutritional deficiency    on supplement   GERD (gastroesophageal reflux disease)    Gout    Hepatitis C infection    not treated - pending appointment in april 2015   History of heart artery stent    Hypertension    Presbyopia    wears bifocals   Trouble in sleeping    Past Surgical History:  Procedure Laterality Date   APPENDECTOMY     CARDIAC CATHETERIZATION     7 YRS AGO W/STENT   CHOLECYSTECTOMY     Left shoulder surgery     REVERSE SHOULDER ARTHROPLASTY Left 09/25/2022   Procedure: LEFT REVERSE SHOULDER ARTHROPLASTY;  Surgeon: Oliver Barre, MD;  Location: [facility];  Service: Orthopedics;  Laterality: Left;  RNFA NEEDED   Right Hip Replacement Right 01/27/2020   Right Shoulder surgery     TOTAL KNEE ARTHROPLASTY Bilateral    Patient Active Problem List   Diagnosis Date Noted   Osteoarthritis of hips, 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   Arthritis of left glenohumeral joint 09/25/2022   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   Other abnormalities of gait and mobility 01/03/2021   Gout 01/03/2021   Posttraumatic stress disorder 01/03/2021   Gastroesophageal reflux disease 01/03/2021   Hyperlipidemia 01/03/2021   Vitamin B12 deficiency 01/03/2021   Shoulder pain 01/03/2021   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   Coronary atherosclerosis  01/03/2021   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   Chronic midline low back pain without sciatica 05/24/2018   Lumbar spondylosis 05/24/2018   Pain syndrome, chronic 06/15/2017   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/15/2017   Sprain of right wrist 10/31/2016   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   S/P ORIF (open reduction internal fixation) fracture 05/05/2016   Closed fracture of right distal radius 04/18/2016   Common cold 09/25/2013   CAD S/P percutaneous coronary angioplasty 09/25/2013   HCV (hepatitis C virus) 05/15/2013   Cholelithiasis 05/15/2013   Abdominal pain, epigastric 05/15/2013   Pancreatitis, acute 05/13/2013   Unspecified essential hypertension 05/13/2013   Transaminasemia 05/13/2013   Chest pain 05/12/2013    PCP: Jeanice Lim [place] REFERRING PROVIDER: Thane Edu, MD  ONSET DATE: 09/26/22  REFERRING DIAG: L reverse total shoulder replacement  THERAPY DIAG:  Acute pain of left shoulder  Stiffness of left shoulder, not elsewhere classified  Other symptoms and signs involving the musculoskeletal system  Rationale for Evaluation and Treatment: Rehabilitation  SUBJECTIVE:   SUBJECTIVE STATEMENT: "Is it really going to take 6 months for me to get back to my self?" Pt accompanied by: self  PERTINENT HISTORY: Pt has had severe arthritis for years. No other PMH noted in chart.   PRECAUTIONS: Shoulder  WEIGHT BEARING RESTRICTIONS: Yes >2lbs  PAIN:  Are you having pain? Yes: NPRS scale: 8/10 Pain location: anterior shoulder girdel Pain description: recurring pain Aggravating factors: movement Relieving factors: sleep  FALLS: Has patient fallen in last 6 months? Yes. Number of falls frequently  PLOF: Independent  PATIENT GOALS: To get back to where I was  NEXT MD VISIT: 11/07/22  OBJECTIVE:   HAND DOMINANCE: Right  ADLs: Overall ADLs: Mod assist for dressing and bathing, difficulty with cooking and cleaning due to weakness and  protocol  FUNCTIONAL OUTCOME MEASURES: FOTO: 40.12  UPPER EXTREMITY ROM:       Assessed in supine, er/IR adducted  Passive ROM Left eval  Shoulder flexion 118  Shoulder abduction 87  Shoulder internal rotation 90  Shoulder external rotation 27  (Blank rows = not tested)    UPPER EXTREMITY MMT:     Assessed in seating, er/IR adducted  MMT Left eval  Shoulder flexion   Shoulder abduction   Shoulder internal rotation   Shoulder external rotation   (Blank rows = not tested)  SENSATION: WFL  EDEMA: No swelling noted  OBSERVATIONS: Moderate to severe fascial restrictions    TODAY'S TREATMENT:                                                                                                                              DATE:   11/27/22 -Manual Therapy: myofascial release and trigger point applied to biceps, deltoid, trapezius, and scapular region in order to reduce fascial restrictions and pain, as well as improving ROM.  -P/ROM: supine, flexion, abduction, horizontal abduction, er/IR, x10 -AA/ROM: supine, flexion, abduction, horizontal abduction, protraction, er/IR, x10 -wall wash: flexion, abduction, x60" -Pulleys: Flexion and abduction, x60" each  11/16/22 -Manual Therapy: myofascial release and trigger point applied to biceps, deltoid, trapezius, and scapular region in order to reduce fascial restrictions and pain, as well as improving ROM.  -P/ROM: supine, flexion, abduction, horizontal abduction, er/IR, x10 -Wall Slides: flexion, abduction, x10 -Low Level Wall Washes: 2x60" -Pulleys: Flexion and abduction, x60" each  11/14/22 -Manual Therapy: myofascial release and trigger point applied to biceps, deltoid, trapezius, and scapular region in order to reduce fascial restrictions and pain, as well as improving ROM.  -P/ROM: supine, flexion, abduction, horizontal abduction, er/IR, x10 -Ball Rolls: flexion, abduction, x12 -Low Level Wall Washes: 2x60" -Thumb Tacs:  1x60"   PATIENT EDUCATION: Education details: Continue HEP Person educated: Patient Education method: Explanation, Demonstration, and Handouts Education comprehension: verbalized understanding and returned demonstration  HOME EXERCISE PROGRAM: 7/11: Tables slides and Pendulums 8/12: AA/ROM  GOALS: Goals reviewed with patient? Yes   SHORT TERM GOALS: Target date: 11/21/22  Pt will be provided with and educated on HEP to improve mobility in LUE required for use during ADL completion.   Goal status: IN PROGRESS  2.  Pt will increase :UE P/ROM by 50 degrees to improve ability to use LUE during dressing tasks with minimal compensatory techniques.  Goal status: IN PROGRESS  3.  Pt will increase LUE strength to 3+/5 to improve ability to reach for items at waist to chest height during bathing and grooming tasks.   Goal status: IN PROGRESS   LONG TERM GOALS: Target date: 12/22/22  Pt will decrease pain in LUE to 3/10 or less to improve ability to sleep for 2+ consecutive hours without waking due to pain.   Goal status: IN PROGRESS  2.  Pt will decrease LUE fascial restrictions to min amounts or less to improve mobility required for functional reaching tasks.   Goal status: IN PROGRESS  3.  Pt will increase LUE A/ROM by 50 degrees to improve ability to use LUE when reaching overhead or behind back during dressing and bathing tasks.   Goal status: IN PROGRESS  4.  Pt will increase LUE strength to 4+/5 or greater to improve ability to use LLUE when lifting or carrying items during meal preparation/housework/yardwork tasks.   Goal status: IN PROGRESS  5.  Pt will return to highest level of function using LUE as non-dominant during functional task completion.   Goal status: IN PROGRESS   ASSESSMENT:  CLINICAL IMPRESSION: Pt is 9 weeks post surgery this session. He continues to have significant pain and limited P/ROM, around 65-70% of full ROM. This session OT initiated  AA/ROM to see if pt would be able to tolerate increasing his ROM without OT hands on, however he remains 65-70% of full ROM with increased pain. Pt continues to swing his arm around and sling it up in front and to the side of him, despite OT providing education that this is not safe and he needs to stick with the provided HEP. Verbal and tactile cuing provided for positioning and technique throughout session.   PERFORMANCE DEFICITS: in functional skills including in functional skills including ADLs, IADLs, coordination, tone, ROM, strength, pain, fascial restrictions, muscle spasms, and UE functional use.   PLAN:  OT FREQUENCY: 2x/week  OT DURATION: 8 weeks  PLANNED INTERVENTIONS: self care/ADL training, therapeutic exercise, therapeutic activity, neuromuscular re-education, manual therapy, passive range of motion, splinting, electrical stimulation, ultrasound, moist heat, cryotherapy, patient/family education, and DME and/or AE instructions  RECOMMENDED OTHER SERVICES: N/A  CONSULTED AND AGREED WITH PLAN OF CARE: Patient  PLAN FOR NEXT SESSION: Manual Therapy, P/ROM, Table Slides, thumb tacs, low level wall washes   Trish Mage, OTR/L [facility] [phone] Justa Hatchell Rosemarie Beath, OT 11/28/2022, 7:54 AM

## 2022-11-27 NOTE — Patient Instructions (Signed)

## 2022-11-29 ENCOUNTER — Encounter (HOSPITAL_COMMUNITY): Payer: 59 | Admitting: Occupational Therapy

## 2022-11-30 ENCOUNTER — Ambulatory Visit (HOSPITAL_COMMUNITY): Payer: 59 | Admitting: Occupational Therapy

## 2022-11-30 ENCOUNTER — Encounter (HOSPITAL_COMMUNITY): Payer: Self-pay | Admitting: Occupational Therapy

## 2022-11-30 DIAGNOSIS — R29898 Other symptoms and signs involving the musculoskeletal system: Secondary | ICD-10-CM

## 2022-11-30 DIAGNOSIS — M25512 Pain in left shoulder: Secondary | ICD-10-CM | POA: Diagnosis not present

## 2022-11-30 DIAGNOSIS — M25612 Stiffness of left shoulder, not elsewhere classified: Secondary | ICD-10-CM

## 2022-11-30 NOTE — Therapy (Signed)
OUTPATIENT OCCUPATIONAL THERAPY ORTHO TREATMENT NOTE  Patient Name: Derrick Dean MRN: [id number] DOB:29-Mar-1941, 82 y.o., male Today's Date: 12/01/2022   END OF SESSION:  OT End of Session - 11/30/22 1522     Visit Number 5    Number of Visits 17    Date for OT Re-Evaluation 12/22/22    Authorization Type [employer] Dual Complete    OT Start Time 1438    OT Stop Time 1522    OT Time Calculation (min) 44 min    Activity Tolerance Patient tolerated treatment well    Behavior During Therapy WFL for tasks assessed/performed             Past Medical History:  Diagnosis Date   Anemia    Appendicitis    Arthritis    B12 nutritional deficiency    on supplement   GERD (gastroesophageal reflux disease)    Gout    Hepatitis C infection    not treated - pending appointment in april 2015   History of heart artery stent    Hypertension    Presbyopia    wears bifocals   Trouble in sleeping    Past Surgical History:  Procedure Laterality Date   APPENDECTOMY     CARDIAC CATHETERIZATION     7 YRS AGO W/STENT   CHOLECYSTECTOMY     Left shoulder surgery     REVERSE SHOULDER ARTHROPLASTY Left 09/25/2022   Procedure: LEFT REVERSE SHOULDER ARTHROPLASTY;  Surgeon: Oliver Barre, MD;  Location: [facility];  Service: Orthopedics;  Laterality: Left;  RNFA NEEDED   Right Hip Replacement Right 01/27/2020   Right Shoulder surgery     TOTAL KNEE ARTHROPLASTY Bilateral    Patient Active Problem List   Diagnosis Date Noted   Osteoarthritis of hips, 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   Arthritis of left glenohumeral joint 09/25/2022   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   Other abnormalities of gait and mobility 01/03/2021   Gout 01/03/2021   Posttraumatic stress disorder 01/03/2021   Gastroesophageal reflux disease 01/03/2021   Hyperlipidemia 01/03/2021   Vitamin B12 deficiency 01/03/2021   Shoulder pain 01/03/2021   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   Coronary atherosclerosis  01/03/2021   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   Chronic midline low back pain without sciatica 05/24/2018   Lumbar spondylosis 05/24/2018   Pain syndrome, chronic 06/15/2017   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/15/2017   Sprain of right wrist 10/31/2016   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   S/P ORIF (open reduction internal fixation) fracture 05/05/2016   Closed fracture of right distal radius 04/18/2016   Common cold 09/25/2013   CAD S/P percutaneous coronary angioplasty 09/25/2013   HCV (hepatitis C virus) 05/15/2013   Cholelithiasis 05/15/2013   Abdominal pain, epigastric 05/15/2013   Pancreatitis, acute 05/13/2013   Unspecified essential hypertension 05/13/2013   Transaminasemia 05/13/2013   Chest pain 05/12/2013    PCP: Jeanice Lim [place] REFERRING PROVIDER: Thane Edu, MD  ONSET DATE: 09/26/22  REFERRING DIAG: L reverse total shoulder replacement  THERAPY DIAG:  Acute pain of left shoulder  Stiffness of left shoulder, not elsewhere classified  Other symptoms and signs involving the musculoskeletal system  Rationale for Evaluation and Treatment: Rehabilitation  SUBJECTIVE:   SUBJECTIVE STATEMENT: "I am really painful today" Pt accompanied by: self  PERTINENT HISTORY: Pt has had severe arthritis for years. No other PMH noted in chart.   PRECAUTIONS: Shoulder  WEIGHT BEARING RESTRICTIONS: Yes >2lbs  PAIN:  Are you having  pain? Yes: NPRS scale: 8/10 Pain location: anterior shoulder girdel Pain description: recurring pain Aggravating factors: movement Relieving factors: sleep  FALLS: Has patient fallen in last 6 months? Yes. Number of falls frequently  PLOF: Independent  PATIENT GOALS: To get back to where I was  NEXT MD VISIT: 11/07/22  OBJECTIVE:   HAND DOMINANCE: Right  ADLs: Overall ADLs: Mod assist for dressing and bathing, difficulty with cooking and cleaning due to weakness and protocol  FUNCTIONAL OUTCOME MEASURES: FOTO:  40.12  UPPER EXTREMITY ROM:       Assessed in supine, er/IR adducted  Passive ROM Left eval  Shoulder flexion 118  Shoulder abduction 87  Shoulder internal rotation 90  Shoulder external rotation 27  (Blank rows = not tested)    UPPER EXTREMITY MMT:     Assessed in seating, er/IR adducted  MMT Left eval  Shoulder flexion   Shoulder abduction   Shoulder internal rotation   Shoulder external rotation   (Blank rows = not tested)  SENSATION: WFL  EDEMA: No swelling noted  OBSERVATIONS: Moderate to severe fascial restrictions    TODAY'S TREATMENT:                                                                                                                              DATE:   11/30/22 -Manual Therapy: myofascial release and trigger point applied to biceps, deltoid, trapezius, and scapular region in order to reduce fascial restrictions and pain, as well as improving ROM.  -P/ROM: supine, flexion, abduction, horizontal abduction, er/IR, x15 -AA/ROM: supine, flexion, abduction, horizontal abduction, protraction, er/IR, x15 -Wall Slides: flexion, abduction, x10  11/27/22 -Manual Therapy: myofascial release and trigger point applied to biceps, deltoid, trapezius, and scapular region in order to reduce fascial restrictions and pain, as well as improving ROM.  -P/ROM: supine, flexion, abduction, horizontal abduction, er/IR, x10 -AA/ROM: supine, flexion, abduction, horizontal abduction, protraction, er/IR, x10 -wall wash: flexion, abduction, x60" -Pulleys: Flexion and abduction, x60" each  11/16/22 -Manual Therapy: myofascial release and trigger point applied to biceps, deltoid, trapezius, and scapular region in order to reduce fascial restrictions and pain, as well as improving ROM.  -P/ROM: supine, flexion, abduction, horizontal abduction, er/IR, x10 -Wall Slides: flexion, abduction, x10 -Low Level Wall Washes: 2x60" -Pulleys: Flexion and abduction, x60" each   PATIENT  EDUCATION: Education details: Continue HEP Person educated: Patient Education method: Explanation, Demonstration, and Handouts Education comprehension: verbalized understanding and returned demonstration  HOME EXERCISE PROGRAM: 7/11: Tables slides and Pendulums 8/12: AA/ROM  GOALS: Goals reviewed with patient? Yes   SHORT TERM GOALS: Target date: 11/21/22  Pt will be provided with and educated on HEP to improve mobility in LUE required for use during ADL completion.   Goal status: IN PROGRESS  2.  Pt will increase :UE P/ROM by 50 degrees to improve ability to use LUE during dressing tasks with minimal compensatory techniques.   Goal status: IN PROGRESS  3.  Pt will  increase LUE strength to 3+/5 to improve ability to reach for items at waist to chest height during bathing and grooming tasks.   Goal status: IN PROGRESS   LONG TERM GOALS: Target date: 12/22/22  Pt will decrease pain in LUE to 3/10 or less to improve ability to sleep for 2+ consecutive hours without waking due to pain.   Goal status: IN PROGRESS  2.  Pt will decrease LUE fascial restrictions to min amounts or less to improve mobility required for functional reaching tasks.   Goal status: IN PROGRESS  3.  Pt will increase LUE A/ROM by 50 degrees to improve ability to use LUE when reaching overhead or behind back during dressing and bathing tasks.   Goal status: IN PROGRESS  4.  Pt will increase LUE strength to 4+/5 or greater to improve ability to use LLUE when lifting or carrying items during meal preparation/housework/yardwork tasks.   Goal status: IN PROGRESS  5.  Pt will return to highest level of function using LUE as non-dominant during functional task completion.   Goal status: IN PROGRESS   ASSESSMENT:  CLINICAL IMPRESSION: This session, pt continues to demonstrate increased pain and stiffness. He was having increased difficulty with stretching out with P/ROM or AA/ROM. He required increased time  and prolonged stretches to assist with reaching 65% of full ROM and OT was unable to stretch pt further due to pt reporting 11/10 pain in these stretches. OT providing verbal and tactile cuing throughout session for positioning and technique.  PERFORMANCE DEFICITS: in functional skills including in functional skills including ADLs, IADLs, coordination, tone, ROM, strength, pain, fascial restrictions, muscle spasms, and UE functional use.   PLAN:  OT FREQUENCY: 2x/week  OT DURATION: 8 weeks  PLANNED INTERVENTIONS: self care/ADL training, therapeutic exercise, therapeutic activity, neuromuscular re-education, manual therapy, passive range of motion, splinting, electrical stimulation, ultrasound, moist heat, cryotherapy, patient/family education, and DME and/or AE instructions  RECOMMENDED OTHER SERVICES: N/A  CONSULTED AND AGREED WITH PLAN OF CARE: Patient  PLAN FOR NEXT SESSION: Manual Therapy, P/ROM, Table Slides, thumb tacs, low level wall washes   Trish Mage, OTR/L [facility] [phone] Derrick Dean, OT 12/01/2022, 7:29 AM

## 2022-12-03 NOTE — Progress Notes (Signed)
Psychiatric Initial Adult Assessment   Patient Identification: ABEM STPAUL MRN:  [id number] Date of Evaluation:  12/03/2022 Referral Source: *** Chief Complaint:  No chief complaint on file.  Visit Diagnosis: No diagnosis found.  History of Present Illness:   Derrick Dean is a 82 y.o. year old male with a history of PTSD, s/p TKA, who is referred for PTSD.         Associated Signs/Symptoms: Depression Symptoms:  {DEPRESSION SYMPTOMS:20000} (Hypo) Manic Symptoms:  {BHH MANIC SYMPTOMS:22872} Anxiety Symptoms:  {BHH ANXIETY SYMPTOMS:22873} Psychotic Symptoms:  {BHH PSYCHOTIC SYMPTOMS:22874} PTSD Symptoms: {BHH PTSD SYMPTOMS:22875}  Past Psychiatric History:  Outpatient:  Psychiatry admission:  Previous suicide attempt:  Past trials of medication:  History of violence:  History of head injury:   Previous Psychotropic Medications: {YES/NO:21197}  Substance Abuse History in the last 12 months:  {yes no:314532}  Consequences of Substance Abuse: {BHH CONSEQUENCES OF SUBSTANCE ABUSE:22880}  Past Medical History:  Past Medical History:  Diagnosis Date   Anemia    Appendicitis    Arthritis    B12 nutritional deficiency    on supplement   GERD (gastroesophageal reflux disease)    Gout    Hepatitis C infection    not treated - pending appointment in april 2015   History of heart artery stent    Hypertension    Presbyopia    wears bifocals   Trouble in sleeping     Past Surgical History:  Procedure Laterality Date   APPENDECTOMY     CARDIAC CATHETERIZATION     7 YRS AGO W/STENT   CHOLECYSTECTOMY     Left shoulder surgery     REVERSE SHOULDER ARTHROPLASTY Left 09/25/2022   Procedure: LEFT REVERSE SHOULDER ARTHROPLASTY;  Surgeon: Oliver Barre, MD;  Location: [facility];  Service: Orthopedics;  Laterality: Left;  RNFA NEEDED   Right Hip Replacement Right 01/27/2020   Right Shoulder surgery     TOTAL KNEE ARTHROPLASTY Bilateral     Family Psychiatric  History: ***  Family History:  Family History  Problem Relation Age of Onset   Coronary artery disease Mother    Thyroid disease Mother    Heart disease Mother    Coronary artery disease Father    Heart disease Father     Social History:   Social History   Socioeconomic History   Marital status: Single    Spouse name: Not on file   Number of children: Not on file   Years of education: Not on file   Highest education level: Not on file  Occupational History   Occupation: diabled veteran   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  Tobacco Use   Smoking status: Former    Current packs/day: 0.00    Types: Cigarettes    Quit date: 09/14/2001    Years since quitting: 21.2   Smokeless tobacco: Never  Vaping Use   Vaping status: Never Used  Substance and Sexual Activity   Alcohol use: Yes    Alcohol/week: 1.0 standard drink of alcohol    Types: 1 Cans of beer per week    Comment: 3x week   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   Sexual activity: Not Currently  Other Topics Concern   Not on file  Social History Narrative   Divorced 2 times, few daughters   Social Determinants of Health   Financial Resource Strain: Not on file  Food Insecurity: Not on file  Transportation Needs: Not on file  Physical Activity: Not on file  Stress: Not on file  Social Connections: Unknown (08/29/2021)   Received [facility]   Social Network    Social Network: Not on file    Additional Social History: ***  Allergies:   Allergies  Allergen Reactions   Lisinopril     Made me feel funny    Metabolic Disorder Labs: Lab Results  Component Value Date   HGBA1C 6.1 (H) 05/12/2013   MPG 128 (H) 05/12/2013   No results found for: "PROLACTIN" Lab Results  Component Value Date   CHOL 156 05/12/2013   TRIG 147 05/12/2013   HDL 38 (L) 05/12/2013   CHOLHDL 4.1 05/12/2013   VLDL 29 05/12/2013   LDLCALC 89 05/12/2013   Lab Results   Component Value Date   TSH 2.052 05/12/2013    Therapeutic Level Labs: No results found for: "LITHIUM" No results found for: "CBMZ" No results found for: "VALPROATE"  Current Medications: Current Outpatient Medications  Medication Sig Dispense Refill   allopurinol (ZYLOPRIM) 100 MG tablet Take 3 tablets (300 mg total) by mouth daily. Do not start until after resolution of active gout flare (Patient taking differently: Take 300 mg by mouth daily as needed (gout flare).) 30 tablet 0   amLODipine (NORVASC) 10 MG tablet Take 10 mg by mouth daily.     cholecalciferol (VITAMIN D) 1000 UNITS tablet Take 1,000 Units by mouth daily.     colchicine 0.6 MG tablet Take 1 tablet (0.6 mg total) by mouth daily. Take 2 tablets initially (1.2 mg), then 1 tablet one hour later. Can repeat 1 tablet up to three times. (Patient not taking: Reported on 11/21/2022) 5 tablet 0   docusate sodium (COLACE) 100 MG capsule Take 1 capsule (100 mg total) by mouth 2 (two) times daily. Take daily while taking pain medication; hold for diarrhea 60 capsule 0   losartan (COZAAR) 50 MG tablet Take 50 mg by mouth daily.     polyethylene glycol (MIRALAX) 17 g packet Take 17 g by mouth daily as needed for severe constipation. 14 each 0   pravastatin (PRAVACHOL) 40 MG tablet Take 40 mg by mouth at bedtime.     traMADol (ULTRAM) 50 MG tablet Take 1 tablet (50 mg total) by mouth every 8 (eight) hours as needed. 20 tablet 0   traZODone (DESYREL) 50 MG tablet Take 50 mg by mouth at bedtime.     No current facility-administered medications for this visit.    Musculoskeletal: Strength & Muscle Tone: within normal limits Gait & Station: normal Patient leans: N/A  Psychiatric Specialty Exam: Review of Systems  There were no vitals taken for this visit.There is no height or weight on file to calculate BMI.  General Appearance: {Appearance:22683}  Eye Contact:  {BHH EYE CONTACT:22684}  Speech:  Clear and Coherent  Volume:  Normal   Mood:  {BHH MOOD:22306}  Affect:  {Affect (PAA):22687}  Thought Process:  Coherent  Orientation:  Full (Time, Place, and Person)  Thought Content:  Logical  Suicidal Thoughts:  {ST/HT (PAA):22692}  Homicidal Thoughts:  {ST/HT (PAA):22692}  Memory:  Immediate;   Good  Judgement:  {Judgement (PAA):22694}  Insight:  {Insight (PAA):22695}  Psychomotor Activity:  Normal  Concentration:  Concentration: Good and Attention Span: Good  Recall:  Good  Fund of Knowledge:Good  Language: Good  Akathisia:  No  Handed:  Right  AIMS (if indicated):  not done  Assets:  Communication Skills Desire for  Improvement  ADL's:  Intact  Cognition: WNL  Sleep:  {BHH GOOD/FAIR/POOR:22877}   Screenings: Flowsheet Row Admission (Discharged) from 09/25/2022 in [facility] SURGICAL UNIT Pre-Admission Testing 60 from 09/18/2022 in [facility] ED from 08/01/2022 in [facility] Emergency Department at [facility]  C-SSRS RISK CATEGORY No Risk No Risk No Risk       Assessment and Plan:  Assessment  Plan   The patient demonstrates the following risk factors for suicide: Chronic risk factors for suicide include: {Chronic Risk Factors for ZOXWRUE:45409811}. Acute risk factors for suicide include: {Acute Risk Factors for BJYNWGN:56213086}. Protective factors for this patient include: {Protective Factors for Suicide VHQI:69629528}. Considering these factors, the overall suicide risk at this point appears to be {Desc; low/moderate/high:110033}. Patient {ACTION; IS/IS UXL:24401027} appropriate for outpatient follow up.   Collaboration of Care: {BH OP Collaboration of Care:21014065}  Patient/Guardian was advised Release of Information must be obtained prior to any record release in order to collaborate their care with an outside provider. Patient/Guardian was advised if they have not already done so to contact the registration department to sign all necessary forms in order for [place] to  release information regarding their care.   Consent: Patient/Guardian gives verbal consent for treatment and assignment of benefits for services provided during this visit. Patient/Guardian expressed understanding and agreed to proceed.   Neysa Hotter, MD 8/18/20243:17 PM

## 2022-12-04 ENCOUNTER — Ambulatory Visit (HOSPITAL_COMMUNITY): Payer: 59 | Admitting: Occupational Therapy

## 2022-12-04 ENCOUNTER — Encounter (HOSPITAL_COMMUNITY): Payer: Self-pay | Admitting: Occupational Therapy

## 2022-12-04 DIAGNOSIS — M25512 Pain in left shoulder: Secondary | ICD-10-CM | POA: Diagnosis not present

## 2022-12-04 DIAGNOSIS — M25612 Stiffness of left shoulder, not elsewhere classified: Secondary | ICD-10-CM

## 2022-12-04 DIAGNOSIS — R29898 Other symptoms and signs involving the musculoskeletal system: Secondary | ICD-10-CM

## 2022-12-04 NOTE — Patient Instructions (Signed)

## 2022-12-04 NOTE — Therapy (Signed)
OUTPATIENT OCCUPATIONAL THERAPY ORTHO TREATMENT NOTE  Patient Name: Derrick Dean MRN: [id number] DOB:1940-05-29, 82 y.o., male Today's Date: 12/04/2022   END OF SESSION:  OT End of Session - 12/04/22 1443     Visit Number 6    Number of Visits 17    Date for OT Re-Evaluation 12/22/22    Authorization Type [employer] Dual Complete    OT Start Time 1350    OT Stop Time 1430    OT Time Calculation (min) 40 min    Activity Tolerance Patient tolerated treatment well    Behavior During Therapy WFL for tasks assessed/performed            Past Medical History:  Diagnosis Date   Anemia    Appendicitis    Arthritis    B12 nutritional deficiency    on supplement   GERD (gastroesophageal reflux disease)    Gout    Hepatitis C infection    not treated - pending appointment in april 2015   History of heart artery stent    Hypertension    Presbyopia    wears bifocals   Trouble in sleeping    Past Surgical History:  Procedure Laterality Date   APPENDECTOMY     CARDIAC CATHETERIZATION     7 YRS AGO W/STENT   CHOLECYSTECTOMY     Left shoulder surgery     REVERSE SHOULDER ARTHROPLASTY Left 09/25/2022   Procedure: LEFT REVERSE SHOULDER ARTHROPLASTY;  Surgeon: Oliver Barre, MD;  Location: [facility];  Service: Orthopedics;  Laterality: Left;  RNFA NEEDED   Right Hip Replacement Right 01/27/2020   Right Shoulder surgery     TOTAL KNEE ARTHROPLASTY Bilateral    Patient Active Problem List   Diagnosis Date Noted   Osteoarthritis of hips, 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   Arthritis of left glenohumeral joint 09/25/2022   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   Other abnormalities of gait and mobility 01/03/2021   Gout 01/03/2021   Posttraumatic stress disorder 01/03/2021   Gastroesophageal reflux disease 01/03/2021   Hyperlipidemia 01/03/2021   Vitamin B12 deficiency 01/03/2021   Shoulder pain 01/03/2021   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   Coronary atherosclerosis  01/03/2021   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   Chronic midline low back pain without sciatica 05/24/2018   Lumbar spondylosis 05/24/2018   Pain syndrome, chronic 06/15/2017   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/15/2017   Sprain of right wrist 10/31/2016   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   S/P ORIF (open reduction internal fixation) fracture 05/05/2016   Closed fracture of right distal radius 04/18/2016   Common cold 09/25/2013   CAD S/P percutaneous coronary angioplasty 09/25/2013   HCV (hepatitis C virus) 05/15/2013   Cholelithiasis 05/15/2013   Abdominal pain, epigastric 05/15/2013   Pancreatitis, acute 05/13/2013   Unspecified essential hypertension 05/13/2013   Transaminasemia 05/13/2013   Chest pain 05/12/2013    PCP: Jeanice Lim [place] REFERRING PROVIDER: Thane Edu, MD  ONSET DATE: 09/26/22  REFERRING DIAG: L reverse total shoulder replacement  THERAPY DIAG:  Acute pain of left shoulder  Stiffness of left shoulder, not elsewhere classified  Other symptoms and signs involving the musculoskeletal system  Rationale for Evaluation and Treatment: Rehabilitation  SUBJECTIVE:   SUBJECTIVE STATEMENT: "I haven't been having much pain" Pt accompanied by: self  PERTINENT HISTORY: Pt has had severe arthritis for years. No other PMH noted in chart.   PRECAUTIONS: Shoulder  WEIGHT BEARING RESTRICTIONS: Yes >2lbs  PAIN:  Are you having  pain? Yes: NPRS scale: 2/10 Pain location: anterior shoulder girdel Pain description: recurring pain Aggravating factors: movement Relieving factors: sleep  FALLS: Has patient fallen in last 6 months? Yes. Number of falls frequently  PLOF: Independent  PATIENT GOALS: To get back to where I was  NEXT MD VISIT: 11/07/22  OBJECTIVE:   HAND DOMINANCE: Right  ADLs: Overall ADLs: Mod assist for dressing and bathing, difficulty with cooking and cleaning due to weakness and protocol  FUNCTIONAL OUTCOME  MEASURES: FOTO: 40.12  UPPER EXTREMITY ROM:       Assessed in supine, er/IR adducted  Passive ROM Left eval  Shoulder flexion 118  Shoulder abduction 87  Shoulder internal rotation 90  Shoulder external rotation 27  (Blank rows = not tested)    UPPER EXTREMITY MMT:     Assessed in seating, er/IR adducted  MMT Left eval  Shoulder flexion   Shoulder abduction   Shoulder internal rotation   Shoulder external rotation   (Blank rows = not tested)  SENSATION: WFL  EDEMA: No swelling noted  OBSERVATIONS: Moderate to severe fascial restrictions    TODAY'S TREATMENT:                                                                                                                              DATE:   12/04/22 -Manual Therapy: myofascial release and trigger point applied to biceps, deltoid, trapezius, and scapular region in order to reduce fascial restrictions and pain, as well as improving ROM.  -P/ROM: supine, flexion, abduction, horizontal abduction, er/IR, x15 -AA/ROM: supine, flexion, abduction, horizontal abduction, protraction, er/IR, x15 -scapular ROM: elevation/depression, retraction/protraction, rows, x10 -Pulleys: Flexion and abduction, x60" each -UBE: level 1, 2' forwards and backwards  11/30/22 -Manual Therapy: myofascial release and trigger point applied to biceps, deltoid, trapezius, and scapular region in order to reduce fascial restrictions and pain, as well as improving ROM.  -P/ROM: supine, flexion, abduction, horizontal abduction, er/IR, x15 -AA/ROM: supine, flexion, abduction, horizontal abduction, protraction, er/IR, x15 -Wall Slides: flexion, abduction, x10  11/27/22 -Manual Therapy: myofascial release and trigger point applied to biceps, deltoid, trapezius, and scapular region in order to reduce fascial restrictions and pain, as well as improving ROM.  -P/ROM: supine, flexion, abduction, horizontal abduction, er/IR, x10 -AA/ROM: supine, flexion,  abduction, horizontal abduction, protraction, er/IR, x10 -wall wash: flexion, abduction, x60" -Pulleys: Flexion and abduction, x60" each   PATIENT EDUCATION: Education details: wall Slides Person educated: Patient Education method: Explanation, Demonstration, and Handouts Education comprehension: verbalized understanding and returned demonstration  HOME EXERCISE PROGRAM: 7/11: Tables slides and Pendulums 8/12: AA/ROM 8/19: Wall Slides  GOALS: Goals reviewed with patient? Yes   SHORT TERM GOALS: Target date: 11/21/22  Pt will be provided with and educated on HEP to improve mobility in LUE required for use during ADL completion.   Goal status: IN PROGRESS  2.  Pt will increase :UE P/ROM by 50 degrees to improve ability to use LUE during dressing tasks  with minimal compensatory techniques.   Goal status: IN PROGRESS  3.  Pt will increase LUE strength to 3+/5 to improve ability to reach for items at waist to chest height during bathing and grooming tasks.   Goal status: IN PROGRESS   LONG TERM GOALS: Target date: 12/22/22  Pt will decrease pain in LUE to 3/10 or less to improve ability to sleep for 2+ consecutive hours without waking due to pain.   Goal status: IN PROGRESS  2.  Pt will decrease LUE fascial restrictions to min amounts or less to improve mobility required for functional reaching tasks.   Goal status: IN PROGRESS  3.  Pt will increase LUE A/ROM by 50 degrees to improve ability to use LUE when reaching overhead or behind back during dressing and bathing tasks.   Goal status: IN PROGRESS  4.  Pt will increase LUE strength to 4+/5 or greater to improve ability to use LLUE when lifting or carrying items during meal preparation/housework/yardwork tasks.   Goal status: IN PROGRESS  5.  Pt will return to highest level of function using LUE as non-dominant during functional task completion.   Goal status: IN PROGRESS   ASSESSMENT:  CLINICAL IMPRESSION: This  session, pt achieving 80% of full ROM passively and 75% AA/ROM. He continues to require increased verbal cuing to slow down and complete controlled movements. Pt reports increased soreness and fatigue throughout session. OT adding UBE this session for endurance work, which he tolerated well. Verbal and tactile cuing provided throughout for positioning.   PERFORMANCE DEFICITS: in functional skills including in functional skills including ADLs, IADLs, coordination, tone, ROM, strength, pain, fascial restrictions, muscle spasms, and UE functional use.   PLAN:  OT FREQUENCY: 2x/week  OT DURATION: 8 weeks  PLANNED INTERVENTIONS: self care/ADL training, therapeutic exercise, therapeutic activity, neuromuscular re-education, manual therapy, passive range of motion, splinting, electrical stimulation, ultrasound, moist heat, cryotherapy, patient/family education, and DME and/or AE instructions  RECOMMENDED OTHER SERVICES: N/A  CONSULTED AND AGREED WITH PLAN OF CARE: Patient  PLAN FOR NEXT SESSION: Manual Therapy, P/ROM, Table Slides, thumb tacs, low level wall washes   Trish Mage, OTR/L [facility] [phone] Pearl Berlinger Rosemarie Beath, OT 12/04/2022, 2:45 PM

## 2022-12-06 ENCOUNTER — Encounter (HOSPITAL_COMMUNITY): Payer: Self-pay | Admitting: Occupational Therapy

## 2022-12-06 ENCOUNTER — Ambulatory Visit (HOSPITAL_COMMUNITY): Payer: 59 | Admitting: Occupational Therapy

## 2022-12-06 DIAGNOSIS — M25512 Pain in left shoulder: Secondary | ICD-10-CM | POA: Diagnosis not present

## 2022-12-06 DIAGNOSIS — R29898 Other symptoms and signs involving the musculoskeletal system: Secondary | ICD-10-CM

## 2022-12-06 DIAGNOSIS — M25612 Stiffness of left shoulder, not elsewhere classified: Secondary | ICD-10-CM

## 2022-12-06 NOTE — Therapy (Signed)
OUTPATIENT OCCUPATIONAL THERAPY ORTHO TREATMENT NOTE  Patient Name: Derrick Dean MRN: [id number] DOB:[birth year], 82 y.o., male Today's Date: 12/06/2022   END OF SESSION:  OT End of Session - 12/06/22 1442     Visit Number 7    Number of Visits 17    Date for OT Re-Evaluation 12/22/22    Authorization Type [employer] Dual Complete    OT Start Time 1400    OT Stop Time 1438    OT Time Calculation (min) 38 min    Activity Tolerance Patient tolerated treatment well    Behavior During Therapy WFL for tasks assessed/performed             Past Medical History:  Diagnosis Date   Anemia    Appendicitis    Arthritis    B12 nutritional deficiency    on supplement   GERD (gastroesophageal reflux disease)    Gout    Hepatitis C infection    not treated - pending appointment in april 2015   History of heart artery stent    Hypertension    Presbyopia    wears bifocals   Trouble in sleeping    Past Surgical History:  Procedure Laterality Date   APPENDECTOMY     CARDIAC CATHETERIZATION     7 YRS AGO W/STENT   CHOLECYSTECTOMY     Left shoulder surgery     REVERSE SHOULDER ARTHROPLASTY Left 09/25/2022   Procedure: LEFT REVERSE SHOULDER ARTHROPLASTY;  Surgeon: Oliver Barre, MD;  Location: [facility];  Service: Orthopedics;  Laterality: Left;  RNFA NEEDED   Right Hip Replacement Right 01/27/2020   Right Shoulder surgery     TOTAL KNEE ARTHROPLASTY Bilateral    Patient Active Problem List   Diagnosis Date Noted   Osteoarthritis of hips, 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   Arthritis of left glenohumeral joint 09/25/2022   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   Other abnormalities of gait and mobility 01/03/2021   Gout 01/03/2021   Posttraumatic stress disorder 01/03/2021   Gastroesophageal reflux disease 01/03/2021   Hyperlipidemia 01/03/2021   Vitamin B12 deficiency 01/03/2021   Shoulder pain 01/03/2021   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   Coronary atherosclerosis  01/03/2021   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   Chronic midline low back pain without sciatica 05/24/2018   Lumbar spondylosis 05/24/2018   Pain syndrome, chronic 06/15/2017   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/15/2017   Sprain of right wrist 10/31/2016   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   S/P ORIF (open reduction internal fixation) fracture 05/05/2016   Closed fracture of right distal radius 04/18/2016   Common cold 09/25/2013   CAD S/P percutaneous coronary angioplasty 09/25/2013   HCV (hepatitis C virus) 05/15/2013   Cholelithiasis 05/15/2013   Abdominal pain, epigastric 05/15/2013   Pancreatitis, acute 05/13/2013   Unspecified essential hypertension 05/13/2013   Transaminasemia 05/13/2013   Chest pain 05/12/2013    PCP: Jeanice Lim [place] REFERRING PROVIDER: Thane Edu, MD  ONSET DATE: 09/26/22  REFERRING DIAG: L reverse total shoulder replacement  THERAPY DIAG:  Acute pain of left shoulder  Stiffness of left shoulder, not elsewhere classified  Other symptoms and signs involving the musculoskeletal system  Rationale for Evaluation and Treatment: Rehabilitation  SUBJECTIVE:   SUBJECTIVE STATEMENT: "I haven't been having much pain" Pt accompanied by: self  PERTINENT HISTORY: Pt has had severe arthritis for years. No other PMH noted in chart.   PRECAUTIONS: Shoulder  WEIGHT BEARING RESTRICTIONS: Yes >2lbs  PAIN:  Are you  having pain? Yes: NPRS scale: 2/10 Pain location: anterior shoulder girdel Pain description: recurring pain Aggravating factors: movement Relieving factors: sleep  FALLS: Has patient fallen in last 6 months? Yes. Number of falls frequently  PLOF: Independent  PATIENT GOALS: To get back to where I was  NEXT MD VISIT: 11/07/22  OBJECTIVE:   HAND DOMINANCE: Right  ADLs: Overall ADLs: Mod assist for dressing and bathing, difficulty with cooking and cleaning due to weakness and protocol  FUNCTIONAL OUTCOME  MEASURES: FOTO: 40.12  UPPER EXTREMITY ROM:       Assessed in supine, er/IR adducted  Passive ROM Left eval  Shoulder flexion 118  Shoulder abduction 87  Shoulder internal rotation 90  Shoulder external rotation 27  (Blank rows = not tested)    UPPER EXTREMITY MMT:     Assessed in seating, er/IR adducted  MMT Left eval  Shoulder flexion   Shoulder abduction   Shoulder internal rotation   Shoulder external rotation   (Blank rows = not tested)  SENSATION: WFL  EDEMA: No swelling noted  OBSERVATIONS: Moderate to severe fascial restrictions    TODAY'S TREATMENT:                                                                                                                              DATE:   12/06/22 -Manual Therapy: myofascial release and trigger point applied to biceps, deltoid, trapezius, and scapular region in order to reduce fascial restrictions and pain, as well as improving ROM.  -AA/ROM: supine, flexion, abduction, horizontal abduction, protraction, er/IR, x15 -A/ROM: supine, flexion, abduction, horizontal abduction, protraction, er/IR, x15 -Scapular Strengthening: red band, extension, protraction, retraction, rows, x12 -Wall Slides: flexion, abduction, x10  12/04/22 -Manual Therapy: myofascial release and trigger point applied to biceps, deltoid, trapezius, and scapular region in order to reduce fascial restrictions and pain, as well as improving ROM.  -P/ROM: supine, flexion, abduction, horizontal abduction, er/IR, x15 -AA/ROM: supine, flexion, abduction, horizontal abduction, protraction, er/IR, x15 -scapular ROM: elevation/depression, retraction/protraction, rows, x10 -Pulleys: Flexion and abduction, x60" each -UBE: level 1, 2' forwards and backwards  11/30/22 -Manual Therapy: myofascial release and trigger point applied to biceps, deltoid, trapezius, and scapular region in order to reduce fascial restrictions and pain, as well as improving ROM.  -P/ROM:  supine, flexion, abduction, horizontal abduction, er/IR, x15 -AA/ROM: supine, flexion, abduction, horizontal abduction, protraction, er/IR, x15 -Wall Slides: flexion, abduction, x10   PATIENT EDUCATION: Education details: A/ROM Person educated: Patient Education method: Programmer, multimedia, Facilities manager, and Handouts Education comprehension: verbalized understanding and returned demonstration  HOME EXERCISE PROGRAM: 7/11: Tables slides and Pendulums 8/12: AA/ROM 8/19: Wall Slides 8/21: A/ROM  GOALS: Goals reviewed with patient? Yes   SHORT TERM GOALS: Target date: 11/21/22  Pt will be provided with and educated on HEP to improve mobility in LUE required for use during ADL completion.   Goal status: IN PROGRESS  2.  Pt will increase :UE P/ROM by 50 degrees to improve ability  to use LUE during dressing tasks with minimal compensatory techniques.   Goal status: IN PROGRESS  3.  Pt will increase LUE strength to 3+/5 to improve ability to reach for items at waist to chest height during bathing and grooming tasks.   Goal status: IN PROGRESS   LONG TERM GOALS: Target date: 12/22/22  Pt will decrease pain in LUE to 3/10 or less to improve ability to sleep for 2+ consecutive hours without waking due to pain.   Goal status: IN PROGRESS  2.  Pt will decrease LUE fascial restrictions to min amounts or less to improve mobility required for functional reaching tasks.   Goal status: IN PROGRESS  3.  Pt will increase LUE A/ROM by 50 degrees to improve ability to use LUE when reaching overhead or behind back during dressing and bathing tasks.   Goal status: IN PROGRESS  4.  Pt will increase LUE strength to 4+/5 or greater to improve ability to use LLUE when lifting or carrying items during meal preparation/housework/yardwork tasks.   Goal status: IN PROGRESS  5.  Pt will return to highest level of function using LUE as non-dominant during functional task completion.   Goal status: IN  PROGRESS   ASSESSMENT:  CLINICAL IMPRESSION: Pt continuing to report pain and stiffness, with limited ability to complete HEP due to other pains in his body. This session he required increased time to complete AA/ROM and A/ROM. OT added scapular strengthening to start addressing shoulder strengthening of the accessory muscles. Verbal and tactile cuing provided throughout session for positioning and technique.   PERFORMANCE DEFICITS: in functional skills including in functional skills including ADLs, IADLs, coordination, tone, ROM, strength, pain, fascial restrictions, muscle spasms, and UE functional use.   PLAN:  OT FREQUENCY: 2x/week  OT DURATION: 8 weeks  PLANNED INTERVENTIONS: self care/ADL training, therapeutic exercise, therapeutic activity, neuromuscular re-education, manual therapy, passive range of motion, splinting, electrical stimulation, ultrasound, moist heat, cryotherapy, patient/family education, and DME and/or AE instructions  RECOMMENDED OTHER SERVICES: N/A  CONSULTED AND AGREED WITH PLAN OF CARE: Patient  PLAN FOR NEXT SESSION: Manual Therapy, P/ROM, Table Slides, thumb tacs, low level wall washes   Trish Mage, OTR/L [facility] [phone] Martika Egler Rosemarie Beath, OT 12/06/2022, 2:43 PM

## 2022-12-06 NOTE — Patient Instructions (Signed)

## 2022-12-07 ENCOUNTER — Ambulatory Visit (INDEPENDENT_AMBULATORY_CARE_PROVIDER_SITE_OTHER): Payer: 59 | Admitting: Psychiatry

## 2022-12-07 DIAGNOSIS — Z91199 Patient's noncompliance with other medical treatment and regimen due to unspecified reason: Secondary | ICD-10-CM

## 2022-12-11 ENCOUNTER — Ambulatory Visit (HOSPITAL_COMMUNITY): Payer: 59 | Admitting: Occupational Therapy

## 2022-12-11 ENCOUNTER — Encounter (HOSPITAL_COMMUNITY): Payer: Self-pay | Admitting: Occupational Therapy

## 2022-12-11 DIAGNOSIS — M25612 Stiffness of left shoulder, not elsewhere classified: Secondary | ICD-10-CM

## 2022-12-11 DIAGNOSIS — R29898 Other symptoms and signs involving the musculoskeletal system: Secondary | ICD-10-CM

## 2022-12-11 DIAGNOSIS — M25512 Pain in left shoulder: Secondary | ICD-10-CM

## 2022-12-11 NOTE — Therapy (Signed)
OUTPATIENT OCCUPATIONAL THERAPY ORTHO TREATMENT NOTE  Patient Name: Derrick Dean MRN: [id number] DOB:July 20, 1940, 82 y.o., male Today's Date: 12/11/2022   END OF SESSION:  OT End of Session - 12/11/22 1424     Visit Number 8    Number of Visits 17    Date for OT Re-Evaluation 12/22/22    Authorization Type [employer] Dual Complete    OT Start Time 1346    OT Stop Time 1425    OT Time Calculation (min) 39 min    Activity Tolerance Patient tolerated treatment well    Behavior During Therapy WFL for tasks assessed/performed              Past Medical History:  Diagnosis Date   Anemia    Appendicitis    Arthritis    B12 nutritional deficiency    on supplement   GERD (gastroesophageal reflux disease)    Gout    Hepatitis C infection    not treated - pending appointment in april 2015   History of heart artery stent    Hypertension    Presbyopia    wears bifocals   Trouble in sleeping    Past Surgical History:  Procedure Laterality Date   APPENDECTOMY     CARDIAC CATHETERIZATION     7 YRS AGO W/STENT   CHOLECYSTECTOMY     Left shoulder surgery     REVERSE SHOULDER ARTHROPLASTY Left 09/25/2022   Procedure: LEFT REVERSE SHOULDER ARTHROPLASTY;  Surgeon: Oliver Barre, MD;  Location: [facility];  Service: Orthopedics;  Laterality: Left;  RNFA NEEDED   Right Hip Replacement Right 01/27/2020   Right Shoulder surgery     TOTAL KNEE ARTHROPLASTY Bilateral    Patient Active Problem List   Diagnosis Date Noted   Osteoarthritis of hips, 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   Arthritis of left glenohumeral joint 09/25/2022   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   Other abnormalities of gait and mobility 01/03/2021   Gout 01/03/2021   Posttraumatic stress disorder 01/03/2021   Gastroesophageal reflux disease 01/03/2021   Hyperlipidemia 01/03/2021   Vitamin B12 deficiency 01/03/2021   Shoulder pain 01/03/2021   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   Coronary atherosclerosis  01/03/2021   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   Chronic midline low back pain without sciatica 05/24/2018   Lumbar spondylosis 05/24/2018   Pain syndrome, chronic 06/15/2017   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/15/2017   Sprain of right wrist 10/31/2016   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   S/P ORIF (open reduction internal fixation) fracture 05/05/2016   Closed fracture of right distal radius 04/18/2016   Common cold 09/25/2013   CAD S/P percutaneous coronary angioplasty 09/25/2013   HCV (hepatitis C virus) 05/15/2013   Cholelithiasis 05/15/2013   Abdominal pain, epigastric 05/15/2013   Pancreatitis, acute 05/13/2013   Unspecified essential hypertension 05/13/2013   Transaminasemia 05/13/2013   Chest pain 05/12/2013    PCP: Jeanice Lim [place] REFERRING PROVIDER: Thane Edu, MD  ONSET DATE: 09/26/22  REFERRING DIAG: L reverse total shoulder replacement  THERAPY DIAG:  Acute pain of left shoulder  Stiffness of left shoulder, not elsewhere classified  Other symptoms and signs involving the musculoskeletal system  Rationale for Evaluation and Treatment: Rehabilitation  SUBJECTIVE:   SUBJECTIVE STATEMENT: "I'm sore today."  PERTINENT HISTORY: Pt has had severe arthritis for years. No other PMH noted in chart.   PRECAUTIONS: Shoulder  WEIGHT BEARING RESTRICTIONS: Yes >2lbs  PAIN:  Are you having pain? Yes: NPRS scale: 7/10  Pain location: anterior shoulder girdel Pain description: sore Aggravating factors: movement Relieving factors: sleep  FALLS: Has patient fallen in last 6 months? Yes. Number of falls frequently  PLOF: Independent  PATIENT GOALS: To get back to where I was  NEXT MD VISIT: 12/19/22  OBJECTIVE:   HAND DOMINANCE: Right  ADLs: Overall ADLs: Mod assist for dressing and bathing, difficulty with cooking and cleaning due to weakness and protocol  FUNCTIONAL OUTCOME MEASURES: FOTO: 40.12  UPPER EXTREMITY ROM:       Assessed  in supine, er/IR adducted  Passive ROM Left eval  Shoulder flexion 118  Shoulder abduction 87  Shoulder internal rotation 90  Shoulder external rotation 27  (Blank rows = not tested)    UPPER EXTREMITY MMT:     Assessed in seating, er/IR adducted  MMT Left eval  Shoulder flexion   Shoulder abduction   Shoulder internal rotation   Shoulder external rotation   (Blank rows = not tested)  OBSERVATIONS: Moderate to severe fascial restrictions    TODAY'S TREATMENT:                                                                                                                              DATE:   12/11/22 -Manual Therapy: myofascial release and trigger point applied to biceps, deltoid, trapezius, and scapular region in order to reduce fascial restrictions and pain, as well as improving ROM.  -P/ROM: supine-flexion, abduction, er/IR, horizontal abduction -AA/ROM: supine, flexion, abduction, horizontal abduction, protraction, 12 reps -A/ROM: supine-er/IR, 12 reps -Shoulder stretches: cross chest stretch, doorway stretch, flexion, 3x10" -A/ROM: standing-protraction, er, 10 reps -AA/ROM: standing-flexion, abduction, horizontal abduction, 10 reps  12/06/22 -Manual Therapy: myofascial release and trigger point applied to biceps, deltoid, trapezius, and scapular region in order to reduce fascial restrictions and pain, as well as improving ROM.  -AA/ROM: supine, flexion, abduction, horizontal abduction, protraction, er/IR, x15 -A/ROM: supine, flexion, abduction, horizontal abduction, protraction, er/IR, x15 -Scapular Strengthening: red band, extension, protraction, retraction, rows, x12 -Wall Slides: flexion, abduction, x10  12/04/22 -Manual Therapy: myofascial release and trigger point applied to biceps, deltoid, trapezius, and scapular region in order to reduce fascial restrictions and pain, as well as improving ROM.  -P/ROM: supine, flexion, abduction, horizontal abduction, er/IR,  x15 -AA/ROM: supine, flexion, abduction, horizontal abduction, protraction, er/IR, x15 -scapular ROM: elevation/depression, retraction/protraction, rows, x10 -Pulleys: Flexion and abduction, x60" each -UBE: level 1, 2' forwards and backwards    PATIENT EDUCATION: Education details: continue with HEP daily Person educated: Patient Education method: Explanation, Demonstration, and Handouts Education comprehension: verbalized understanding and returned demonstration  HOME EXERCISE PROGRAM: 7/11: Tables slides and Pendulums 8/12: AA/ROM 8/19: Wall Slides 8/21: A/ROM  GOALS: Goals reviewed with patient? Yes   SHORT TERM GOALS: Target date: 11/21/22  Pt will be provided with and educated on HEP to improve mobility in LUE required for use during ADL completion.   Goal status: IN PROGRESS  2.  Pt will increase :UE  P/ROM by 50 degrees to improve ability to use LUE during dressing tasks with minimal compensatory techniques.   Goal status: IN PROGRESS  3.  Pt will increase LUE strength to 3+/5 to improve ability to reach for items at waist to chest height during bathing and grooming tasks.   Goal status: IN PROGRESS   LONG TERM GOALS: Target date: 12/22/22  Pt will decrease pain in LUE to 3/10 or less to improve ability to sleep for 2+ consecutive hours without waking due to pain.   Goal status: IN PROGRESS  2.  Pt will decrease LUE fascial restrictions to min amounts or less to improve mobility required for functional reaching tasks.   Goal status: IN PROGRESS  3.  Pt will increase LUE A/ROM by 50 degrees to improve ability to use LUE when reaching overhead or behind back during dressing and bathing tasks.   Goal status: IN PROGRESS  4.  Pt will increase LUE strength to 4+/5 or greater to improve ability to use LLUE when lifting or carrying items during meal preparation/housework/yardwork tasks.   Goal status: IN PROGRESS  5.  Pt will return to highest level of function  using LUE as non-dominant during functional task completion.   Goal status: IN PROGRESS   ASSESSMENT:  CLINICAL IMPRESSION: Pt reports he couldn't do too much this weekend because he was sore. Completed manual techniques and passive stretching, continued with AA/ROM in supine and standing, progressing to A/ROM for some tasks. Pt reports he still cannot stretch across to wash his right arm or side of his face. Added gentle shoulder stretches today to work towards improved ROM required for ADLs. Verbal cuing for form and technique. Educated pt on importance of daily HEP completion for increased progress.   PERFORMANCE DEFICITS: in functional skills including in functional skills including ADLs, IADLs, coordination, tone, ROM, strength, pain, fascial restrictions, muscle spasms, and UE functional use.   PLAN:  OT FREQUENCY: 2x/week  OT DURATION: 8 weeks  PLANNED INTERVENTIONS: self care/ADL training, therapeutic exercise, therapeutic activity, neuromuscular re-education, manual therapy, passive range of motion, splinting, electrical stimulation, ultrasound, moist heat, cryotherapy, patient/family education, and DME and/or AE instructions  CONSULTED AND AGREED WITH PLAN OF CARE: Patient  PLAN FOR NEXT SESSION: Manual Therapy, A/ROM, follow protocol   Ezra Sites, OTR/L  [phone] 12/11/2022, 2:26 PM

## 2022-12-13 ENCOUNTER — Encounter (HOSPITAL_COMMUNITY): Payer: Self-pay | Admitting: Occupational Therapy

## 2022-12-13 ENCOUNTER — Ambulatory Visit (HOSPITAL_COMMUNITY): Payer: 59 | Admitting: Occupational Therapy

## 2022-12-13 DIAGNOSIS — M25612 Stiffness of left shoulder, not elsewhere classified: Secondary | ICD-10-CM

## 2022-12-13 DIAGNOSIS — M25512 Pain in left shoulder: Secondary | ICD-10-CM

## 2022-12-13 DIAGNOSIS — R29898 Other symptoms and signs involving the musculoskeletal system: Secondary | ICD-10-CM

## 2022-12-13 NOTE — Therapy (Signed)
OUTPATIENT OCCUPATIONAL THERAPY ORTHO TREATMENT NOTE  Patient Name: Derrick Dean MRN: [id number] DOB:Jun 13, 1940, 82 y.o., male Today's Date: 12/13/2022   END OF SESSION:  OT End of Session - 12/13/22 1420     Visit Number 9    Number of Visits 17    Date for OT Re-Evaluation 12/22/22    Authorization Type [employer] Dual Complete    Authorization Time Period auth requested 12/13/22    OT Start Time 1340    OT Stop Time 1421    OT Time Calculation (min) 41 min    Activity Tolerance Patient tolerated treatment well    Behavior During Therapy WFL for tasks assessed/performed               Past Medical History:  Diagnosis Date   Anemia    Appendicitis    Arthritis    B12 nutritional deficiency    on supplement   GERD (gastroesophageal reflux disease)    Gout    Hepatitis C infection    not treated - pending appointment in april 2015   History of heart artery stent    Hypertension    Presbyopia    wears bifocals   Trouble in sleeping    Past Surgical History:  Procedure Laterality Date   APPENDECTOMY     CARDIAC CATHETERIZATION     7 YRS AGO W/STENT   CHOLECYSTECTOMY     Left shoulder surgery     REVERSE SHOULDER ARTHROPLASTY Left 09/25/2022   Procedure: LEFT REVERSE SHOULDER ARTHROPLASTY;  Surgeon: Oliver Barre, MD;  Location: [facility];  Service: Orthopedics;  Laterality: Left;  RNFA NEEDED   Right Hip Replacement Right 01/27/2020   Right Shoulder surgery     TOTAL KNEE ARTHROPLASTY Bilateral    Patient Active Problem List   Diagnosis Date Noted   Osteoarthritis of hips, 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   Arthritis of left glenohumeral joint 09/25/2022   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   Other abnormalities of gait and mobility 01/03/2021   Gout 01/03/2021   Posttraumatic stress disorder 01/03/2021   Gastroesophageal reflux disease 01/03/2021   Hyperlipidemia 01/03/2021   Vitamin B12 deficiency 01/03/2021   Shoulder pain 01/03/2021   Recurrent major  depression (HCC) 01/03/2021   Coronary atherosclerosis 01/03/2021   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   Chronic midline low back pain without sciatica 05/24/2018   Lumbar spondylosis 05/24/2018   Pain syndrome, chronic 06/15/2017   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/15/2017   Sprain of right wrist 10/31/2016   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   S/P ORIF (open reduction internal fixation) fracture 05/05/2016   Closed fracture of right distal radius 04/18/2016   Common cold 09/25/2013   CAD S/P percutaneous coronary angioplasty 09/25/2013   HCV (hepatitis C virus) 05/15/2013   Cholelithiasis 05/15/2013   Abdominal pain, epigastric 05/15/2013   Pancreatitis, acute 05/13/2013   Unspecified essential hypertension 05/13/2013   Transaminasemia 05/13/2013   Chest pain 05/12/2013    PCP: Jeanice Lim [place] REFERRING PROVIDER: Thane Edu, MD  ONSET DATE: 09/26/22  REFERRING DIAG: L reverse total shoulder replacement  THERAPY DIAG:  Acute pain of left shoulder  Stiffness of left shoulder, not elsewhere classified  Other symptoms and signs involving the musculoskeletal system  Rationale for Evaluation and Treatment: Rehabilitation  SUBJECTIVE:   SUBJECTIVE STATEMENT: "I did some of my exercises."   PERTINENT HISTORY: Pt has had severe arthritis for years. No other PMH noted in chart.   PRECAUTIONS: Shoulder  WEIGHT BEARING  RESTRICTIONS: Yes >2lbs  PAIN:  Are you having pain? Yes: NPRS scale: 7/10 Pain location: anterior shoulder girdel Pain description: sore Aggravating factors: movement Relieving factors: sleep  FALLS: Has patient fallen in last 6 months? Yes. Number of falls frequently  PLOF: Independent  PATIENT GOALS: To get back to where I was  NEXT MD VISIT: 12/19/22  OBJECTIVE:   HAND DOMINANCE: Right  ADLs: Overall ADLs: Mod assist for dressing and bathing, difficulty with cooking and cleaning due to weakness and protocol  FUNCTIONAL  OUTCOME MEASURES: FOTO: 40.12  UPPER EXTREMITY ROM:       Assessed in supine, er/IR adducted  Passive ROM Left eval  Shoulder flexion 118  Shoulder abduction 87  Shoulder internal rotation 90  Shoulder external rotation 27  (Blank rows = not tested)    UPPER EXTREMITY MMT:     Assessed in seating, er/IR adducted  MMT Left eval  Shoulder flexion   Shoulder abduction   Shoulder internal rotation   Shoulder external rotation   (Blank rows = not tested)  OBSERVATIONS: Moderate to severe fascial restrictions    TODAY'S TREATMENT:                                                                                                                              DATE:   12/13/22 -Manual Therapy: myofascial release and trigger point applied to biceps, deltoid, trapezius, and scapular region in order to reduce fascial restrictions and pain, as well as improving ROM.  -P/ROM: supine-flexion, abduction, er/IR, horizontal abduction -A/ROM: supine-protraction, flexion, horizontal abduction, er, abduction, 10 reps -Proximal shoulder strengthening: supine-paddles, criss cross, circles each direction, 10 reps each -Shoulder stretches: cross chest stretch, doorway stretch, flexion, 3x10" -A/ROM: supine-protraction, flexion, horizontal abduction, er, abduction, 10 reps -Functional Reaching: standing at overhead cabinet, placing 10 cones on top shelf in flexion and removing in abduction  12/11/22 -Manual Therapy: myofascial release and trigger point applied to biceps, deltoid, trapezius, and scapular region in order to reduce fascial restrictions and pain, as well as improving ROM.  -P/ROM: supine-flexion, abduction, er/IR, horizontal abduction -AA/ROM: supine, flexion, abduction, horizontal abduction, protraction, 12 reps -A/ROM: supine-er/IR, 12 reps -Shoulder stretches: cross chest stretch, doorway stretch, flexion, 3x10" -A/ROM: standing-protraction, er, 10 reps -AA/ROM: standing-flexion,  abduction, horizontal abduction, 10 reps  12/06/22 -Manual Therapy: myofascial release and trigger point applied to biceps, deltoid, trapezius, and scapular region in order to reduce fascial restrictions and pain, as well as improving ROM.  -AA/ROM: supine, flexion, abduction, horizontal abduction, protraction, er/IR, x15 -A/ROM: supine, flexion, abduction, horizontal abduction, protraction, er/IR, x15 -Scapular Strengthening: red band, extension, protraction, retraction, rows, x12 -Wall Slides: flexion, abduction, x10   PATIENT EDUCATION: Education details: continue with HEP daily Person educated: Patient Education method: Explanation, Demonstration, and Handouts Education comprehension: verbalized understanding and returned demonstration  HOME EXERCISE PROGRAM: 7/11: Tables slides and Pendulums 8/12: AA/ROM 8/19: Wall Slides 8/21: A/ROM  GOALS: Goals reviewed with patient? Yes  SHORT TERM GOALS: Target date: 11/21/22  Pt will be provided with and educated on HEP to improve mobility in LUE required for use during ADL completion.   Goal status: IN PROGRESS  2.  Pt will increase :UE P/ROM by 50 degrees to improve ability to use LUE during dressing tasks with minimal compensatory techniques.   Goal status: IN PROGRESS  3.  Pt will increase LUE strength to 3+/5 to improve ability to reach for items at waist to chest height during bathing and grooming tasks.   Goal status: IN PROGRESS   LONG TERM GOALS: Target date: 12/22/22  Pt will decrease pain in LUE to 3/10 or less to improve ability to sleep for 2+ consecutive hours without waking due to pain.   Goal status: IN PROGRESS  2.  Pt will decrease LUE fascial restrictions to min amounts or less to improve mobility required for functional reaching tasks.   Goal status: IN PROGRESS  3.  Pt will increase LUE A/ROM by 50 degrees to improve ability to use LUE when reaching overhead or behind back during dressing and bathing  tasks.   Goal status: IN PROGRESS  4.  Pt will increase LUE strength to 4+/5 or greater to improve ability to use LLUE when lifting or carrying items during meal preparation/housework/yardwork tasks.   Goal status: IN PROGRESS  5.  Pt will return to highest level of function using LUE as non-dominant during functional task completion.   Goal status: IN PROGRESS   ASSESSMENT:  CLINICAL IMPRESSION: Pt reports he did some of his exercises since last session. Continued with manual techniques, passive stretching. Pt completing A/ROM in supine and standing, continued with shoulder stretches. Consistent verbal cuing and intermittent visual cues for form and technique. Cuing to use shoulder musculature and activate strength versus using momentum for ROM. Added functional reaching task in flexion and abduction. Pt requiring increased time for task.   PERFORMANCE DEFICITS: in functional skills including in functional skills including ADLs, IADLs, coordination, tone, ROM, strength, pain, fascial restrictions, muscle spasms, and UE functional use.   PLAN:  OT FREQUENCY: 2x/week  OT DURATION: 8 weeks  PLANNED INTERVENTIONS: self care/ADL training, therapeutic exercise, therapeutic activity, neuromuscular re-education, manual therapy, passive range of motion, splinting, electrical stimulation, ultrasound, moist heat, cryotherapy, patient/family education, and DME and/or AE instructions  CONSULTED AND AGREED WITH PLAN OF CARE: Patient  PLAN FOR NEXT SESSION: Manual Therapy, A/ROM, follow protocol   Ezra Sites, OTR/L  [phone] 12/13/2022, 3:10 PM

## 2022-12-19 ENCOUNTER — Other Ambulatory Visit (INDEPENDENT_AMBULATORY_CARE_PROVIDER_SITE_OTHER): Payer: No Typology Code available for payment source

## 2022-12-19 ENCOUNTER — Ambulatory Visit (HOSPITAL_COMMUNITY): Payer: 59 | Attending: Orthopedic Surgery | Admitting: Occupational Therapy

## 2022-12-19 ENCOUNTER — Encounter: Payer: Self-pay | Admitting: Orthopedic Surgery

## 2022-12-19 ENCOUNTER — Encounter (HOSPITAL_COMMUNITY): Payer: Self-pay | Admitting: Occupational Therapy

## 2022-12-19 ENCOUNTER — Ambulatory Visit (INDEPENDENT_AMBULATORY_CARE_PROVIDER_SITE_OTHER): Payer: 59 | Admitting: Orthopedic Surgery

## 2022-12-19 DIAGNOSIS — Z96612 Presence of left artificial shoulder joint: Secondary | ICD-10-CM

## 2022-12-19 DIAGNOSIS — R29898 Other symptoms and signs involving the musculoskeletal system: Secondary | ICD-10-CM

## 2022-12-19 DIAGNOSIS — M25612 Stiffness of left shoulder, not elsewhere classified: Secondary | ICD-10-CM | POA: Diagnosis present

## 2022-12-19 DIAGNOSIS — M25512 Pain in left shoulder: Secondary | ICD-10-CM

## 2022-12-19 NOTE — Progress Notes (Signed)
Orthopaedic Postop Note  Assessment: Derrick Dean is a 82 y.o. male s/p Left Reverse Shoulder Arthroplasty  DOS: 09/25/22  Plan: Derrick Dean is doing well.  His motion and strength is better.  He is working well with PT but is ready to start with a home exercise program.  Radiographs look good.  No issues with his surgical incisions.  I am pleased with his progress.  He states his understanding.  I would like see him back in 3 months.   Follow-up: Return in about 3 months (around 03/20/2023).  XR at next visit: Left shoulder  Subjective:  Chief Complaint  Patient presents with   Routine Post Op     L  RSA DOS 09/25/22    History of Present Illness: Derrick Dean is a 82 y.o. male who presents following the above stated procedure.  Surgery was approximately 3 months ago.  He is getting better.  No issues with his surgical incision.  He is still working with PT.  No medications on a consistent basis.    Review of Systems: No fevers or chills No numbness or tingling No Chest Pain No shortness of breath   Objective: There were no vitals taken for this visit.  Physical Exam:  Alert and oriented.  No acute distress.  Anterior based shoulder incision is healing well.  No surrounding erythema or drainage.  Sensation is intact in the axillary nerve distribution.  2+ radial pulse.  Sensation intact throughout the left hand.  Active forward flexion to 140, abduction to 100.  Passive ER to 30.  Mild weakness on strength testing.   IMAGING: I personally ordered and reviewed the following images:  X-rays left shoulder obtained in clinic today.  He is compared to prior x-rays.  There is a well-positioned shoulder arthroplasty.  No evidence of subsidence.  No fractures.  No lucency around the prosthesis.  No Bony lesions.  Impression: Reverse shoulder arthroplasty in stable position, without evidence of hardware failure or subsidence   Oliver Barre, MD 12/19/2022 3:43  PM

## 2022-12-19 NOTE — Patient Instructions (Signed)

## 2022-12-19 NOTE — Therapy (Signed)
OUTPATIENT OCCUPATIONAL THERAPY ORTHO TREATMENT NOTE  Patient Name: Derrick Dean MRN: [id number] DOB:Nov 06, 1940, 82 y.o., male Today's Date: 12/20/2022   END OF SESSION:  OT End of Session - 12/19/22 1515     Visit Number 10    Number of Visits 17    Date for OT Re-Evaluation 12/22/22    Authorization Type [employer] Dual Complete    Authorization Time Period auth requested 12/13/22    OT Start Time 1433    OT Stop Time 1516    OT Time Calculation (min) 43 min    Activity Tolerance Patient tolerated treatment well    Behavior During Therapy WFL for tasks assessed/performed              Past Medical History:  Diagnosis Date   Anemia    Appendicitis    Arthritis    B12 nutritional deficiency    on supplement   GERD (gastroesophageal reflux disease)    Gout    Hepatitis C infection    not treated - pending appointment in april 2015   History of heart artery stent    Hypertension    Presbyopia    wears bifocals   Trouble in sleeping    Past Surgical History:  Procedure Laterality Date   APPENDECTOMY     CARDIAC CATHETERIZATION     7 YRS AGO W/STENT   CHOLECYSTECTOMY     Left shoulder surgery     REVERSE SHOULDER ARTHROPLASTY Left 09/25/2022   Procedure: LEFT REVERSE SHOULDER ARTHROPLASTY;  Surgeon: Oliver Barre, MD;  Location: [facility];  Service: Orthopedics;  Laterality: Left;  RNFA NEEDED   Right Hip Replacement Right 01/27/2020   Right Shoulder surgery     TOTAL KNEE ARTHROPLASTY Bilateral    Patient Active Problem List   Diagnosis Date Noted   Osteoarthritis of hips, 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   Arthritis of left glenohumeral joint 09/25/2022   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   Other abnormalities of gait and mobility 01/03/2021   Gout 01/03/2021   Posttraumatic stress disorder 01/03/2021   Gastroesophageal reflux disease 01/03/2021   Hyperlipidemia 01/03/2021   Vitamin B12 deficiency 01/03/2021   Shoulder pain 01/03/2021   Recurrent major  depression (HCC) 01/03/2021   Coronary atherosclerosis 01/03/2021   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   Chronic midline low back pain without sciatica 05/24/2018   Lumbar spondylosis 05/24/2018   Pain syndrome, chronic 06/15/2017   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/15/2017   Sprain of right wrist 10/31/2016   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   S/P ORIF (open reduction internal fixation) fracture 05/05/2016   Closed fracture of right distal radius 04/18/2016   Common cold 09/25/2013   CAD S/P percutaneous coronary angioplasty 09/25/2013   HCV (hepatitis C virus) 05/15/2013   Cholelithiasis 05/15/2013   Abdominal pain, epigastric 05/15/2013   Pancreatitis, acute 05/13/2013   Unspecified essential hypertension 05/13/2013   Transaminasemia 05/13/2013   Chest pain 05/12/2013    PCP: Jeanice Lim [place] REFERRING PROVIDER: Thane Edu, MD  ONSET DATE: 09/26/22  REFERRING DIAG: L reverse total shoulder replacement  THERAPY DIAG:  Acute pain of left shoulder  Stiffness of left shoulder, not elsewhere classified  Other symptoms and signs involving the musculoskeletal system  Rationale for Evaluation and Treatment: Rehabilitation  SUBJECTIVE:   SUBJECTIVE STATEMENT: "I did some of my exercises."   PERTINENT HISTORY: Pt has had severe arthritis for years. No other PMH noted in chart.   PRECAUTIONS: Shoulder  WEIGHT BEARING RESTRICTIONS:  Yes >2lbs  PAIN:  Are you having pain? Yes: NPRS scale: 7/10 Pain location: anterior shoulder girdel Pain description: sore Aggravating factors: movement Relieving factors: sleep  FALLS: Has patient fallen in last 6 months? Yes. Number of falls frequently  PLOF: Independent  PATIENT GOALS: To get back to where I was  NEXT MD VISIT: 12/19/22  OBJECTIVE:   HAND DOMINANCE: Right  ADLs: Overall ADLs: Mod assist for dressing and bathing, difficulty with cooking and cleaning due to weakness and protocol  FUNCTIONAL  OUTCOME MEASURES: FOTO: 40.12  UPPER EXTREMITY ROM:       Assessed in supine, er/IR adducted  Passive ROM Left eval  Shoulder flexion 118  Shoulder abduction 87  Shoulder internal rotation 90  Shoulder external rotation 27  (Blank rows = not tested)    UPPER EXTREMITY MMT:     Assessed in seating, er/IR adducted  MMT Left eval  Shoulder flexion   Shoulder abduction   Shoulder internal rotation   Shoulder external rotation   (Blank rows = not tested)  OBSERVATIONS: Moderate to severe fascial restrictions    TODAY'S TREATMENT:                                                                                                                              DATE:   12/19/22 -Manual Therapy: myofascial release and trigger point applied to biceps, deltoid, trapezius, and scapular region in order to reduce fascial restrictions and pain, as well as improving ROM.  -A/ROM: supine-protraction, flexion, horizontal abduction, er, abduction, x15 -Wall Slides: flexion and abduction, x12 -Isometrics: flexion, extension, abduction, ir, 4x15"  12/13/22 -Manual Therapy: myofascial release and trigger point applied to biceps, deltoid, trapezius, and scapular region in order to reduce fascial restrictions and pain, as well as improving ROM.  -P/ROM: supine-flexion, abduction, er/IR, horizontal abduction -A/ROM: supine-protraction, flexion, horizontal abduction, er, abduction, 10 reps -Proximal shoulder strengthening: supine-paddles, criss cross, circles each direction, 10 reps each -Shoulder stretches: cross chest stretch, doorway stretch, flexion, 3x10" -A/ROM: supine-protraction, flexion, horizontal abduction, er, abduction, 10 reps -Functional Reaching: standing at overhead cabinet, placing 10 cones on top shelf in flexion and removing in abduction  12/11/22 -Manual Therapy: myofascial release and trigger point applied to biceps, deltoid, trapezius, and scapular region in order to reduce  fascial restrictions and pain, as well as improving ROM.  -P/ROM: supine-flexion, abduction, er/IR, horizontal abduction -AA/ROM: supine, flexion, abduction, horizontal abduction, protraction, 12 reps -A/ROM: supine-er/IR, 12 reps -Shoulder stretches: cross chest stretch, doorway stretch, flexion, 3x10" -A/ROM: standing-protraction, er, 10 reps -AA/ROM: standing-flexion, abduction, horizontal abduction, 10 reps  12/06/22 -Manual Therapy: myofascial release and trigger point applied to biceps, deltoid, trapezius, and scapular region in order to reduce fascial restrictions and pain, as well as improving ROM.  -AA/ROM: supine, flexion, abduction, horizontal abduction, protraction, er/IR, x15 -A/ROM: supine, flexion, abduction, horizontal abduction, protraction, er/IR, x15 -Scapular Strengthening: red band, extension, protraction, retraction, rows, x12 -Wall Slides: flexion, abduction, x10  PATIENT EDUCATION: Education details: Isometrics Person educated: Patient Education method: Explanation, Demonstration, and Handouts Education comprehension: verbalized understanding and returned demonstration  HOME EXERCISE PROGRAM: 7/11: Tables slides and Pendulums 8/12: AA/ROM 8/19: Wall Slides 8/21: A/ROM 9/3: Isometrics  GOALS: Goals reviewed with patient? Yes   SHORT TERM GOALS: Target date: 11/21/22  Pt will be provided with and educated on HEP to improve mobility in LUE required for use during ADL completion.   Goal status: IN PROGRESS  2.  Pt will increase :UE P/ROM by 50 degrees to improve ability to use LUE during dressing tasks with minimal compensatory techniques.   Goal status: IN PROGRESS  3.  Pt will increase LUE strength to 3+/5 to improve ability to reach for items at waist to chest height during bathing and grooming tasks.   Goal status: IN PROGRESS   LONG TERM GOALS: Target date: 12/22/22  Pt will decrease pain in LUE to 3/10 or less to improve ability to sleep for 2+  consecutive hours without waking due to pain.   Goal status: IN PROGRESS  2.  Pt will decrease LUE fascial restrictions to min amounts or less to improve mobility required for functional reaching tasks.   Goal status: IN PROGRESS  3.  Pt will increase LUE A/ROM by 50 degrees to improve ability to use LUE when reaching overhead or behind back during dressing and bathing tasks.   Goal status: IN PROGRESS  4.  Pt will increase LUE strength to 4+/5 or greater to improve ability to use LLUE when lifting or carrying items during meal preparation/housework/yardwork tasks.   Goal status: IN PROGRESS  5.  Pt will return to highest level of function using LUE as non-dominant during functional task completion.   Goal status: IN PROGRESS   ASSESSMENT:  CLINICAL IMPRESSION: Pt continuing to report continued pain and difficulty with reaching up and across for bathing. OT provided manual therapy to improve pain and decrease fascial restrictions. With A/ROM pt is able to achieve approximately 75% of full ROM. OT added isometrics to assist pt in starting stabilization and proximal strengthening activities in order to improve his functional mobility. Verbal and tactile cuing provided for positioning and technique throughout session.   PERFORMANCE DEFICITS: in functional skills including in functional skills including ADLs, IADLs, coordination, tone, ROM, strength, pain, fascial restrictions, muscle spasms, and UE functional use.   PLAN:  OT FREQUENCY: 2x/week  OT DURATION: 8 weeks  PLANNED INTERVENTIONS: self care/ADL training, therapeutic exercise, therapeutic activity, neuromuscular re-education, manual therapy, passive range of motion, splinting, electrical stimulation, ultrasound, moist heat, cryotherapy, patient/family education, and DME and/or AE instructions  CONSULTED AND AGREED WITH PLAN OF CARE: Patient  PLAN FOR NEXT SESSION: Manual Therapy, A/ROM, follow protocol   Trish Mage,  OTR/L [phone] 12/20/2022, 1:05 PM

## 2022-12-21 ENCOUNTER — Ambulatory Visit (HOSPITAL_COMMUNITY): Payer: 59 | Admitting: Occupational Therapy

## 2022-12-21 ENCOUNTER — Encounter (HOSPITAL_COMMUNITY): Payer: Self-pay | Admitting: Occupational Therapy

## 2022-12-21 DIAGNOSIS — M25612 Stiffness of left shoulder, not elsewhere classified: Secondary | ICD-10-CM

## 2022-12-21 DIAGNOSIS — R29898 Other symptoms and signs involving the musculoskeletal system: Secondary | ICD-10-CM

## 2022-12-21 DIAGNOSIS — M25512 Pain in left shoulder: Secondary | ICD-10-CM

## 2022-12-21 NOTE — Therapy (Signed)
OUTPATIENT OCCUPATIONAL THERAPY ORTHO TREATMENT NOTE  Patient Name: Derrick Dean MRN: [id number] DOB:May 05, 1940, 82 y.o., male Today's Date: 12/22/2022   END OF SESSION:  OT End of Session - 12/21/22 1400     Visit Number 11    Number of Visits 17    Date for OT Re-Evaluation 12/22/22    Authorization Type [employer] Dual Complete    Authorization Time Period auth requested 12/13/22    OT Start Time 1322    OT Stop Time 1400    OT Time Calculation (min) 38 min    Activity Tolerance Patient tolerated treatment well    Behavior During Therapy WFL for tasks assessed/performed             Past Medical History:  Diagnosis Date   Anemia    Appendicitis    Arthritis    B12 nutritional deficiency    on supplement   GERD (gastroesophageal reflux disease)    Gout    Hepatitis C infection    not treated - pending appointment in april 2015   History of heart artery stent    Hypertension    Presbyopia    wears bifocals   Trouble in sleeping    Past Surgical History:  Procedure Laterality Date   APPENDECTOMY     CARDIAC CATHETERIZATION     7 YRS AGO W/STENT   CHOLECYSTECTOMY     Left shoulder surgery     REVERSE SHOULDER ARTHROPLASTY Left 09/25/2022   Procedure: LEFT REVERSE SHOULDER ARTHROPLASTY;  Surgeon: Oliver Barre, MD;  Location: [facility];  Service: Orthopedics;  Laterality: Left;  RNFA NEEDED   Right Hip Replacement Right 01/27/2020   Right Shoulder surgery     TOTAL KNEE ARTHROPLASTY Bilateral    Patient Active Problem List   Diagnosis Date Noted   Osteoarthritis of hips, 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   Arthritis of left glenohumeral joint 09/25/2022   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   Other abnormalities of gait and mobility 01/03/2021   Gout 01/03/2021   Posttraumatic stress disorder 01/03/2021   Gastroesophageal reflux disease 01/03/2021   Hyperlipidemia 01/03/2021   Vitamin B12 deficiency 01/03/2021   Shoulder pain 01/03/2021   Recurrent major  depression (HCC) 01/03/2021   Coronary atherosclerosis 01/03/2021   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   Chronic midline low back pain without sciatica 05/24/2018   Lumbar spondylosis 05/24/2018   Pain syndrome, chronic 06/15/2017   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/15/2017   Sprain of right wrist 10/31/2016   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   S/P ORIF (open reduction internal fixation) fracture 05/05/2016   Closed fracture of right distal radius 04/18/2016   Common cold 09/25/2013   CAD S/P percutaneous coronary angioplasty 09/25/2013   HCV (hepatitis C virus) 05/15/2013   Cholelithiasis 05/15/2013   Abdominal pain, epigastric 05/15/2013   Pancreatitis, acute 05/13/2013   Unspecified essential hypertension 05/13/2013   Transaminasemia 05/13/2013   Chest pain 05/12/2013    PCP: Jeanice Lim [place] REFERRING PROVIDER: Thane Edu, MD  ONSET DATE: 09/26/22  REFERRING DIAG: L reverse total shoulder replacement  THERAPY DIAG:  Acute pain of left shoulder  Stiffness of left shoulder, not elsewhere classified  Other symptoms and signs involving the musculoskeletal system  Rationale for Evaluation and Treatment: Rehabilitation  SUBJECTIVE:   SUBJECTIVE STATEMENT: "I did some of my exercises."   PERTINENT HISTORY: Pt has had severe arthritis for years. No other PMH noted in chart.   PRECAUTIONS: Shoulder  WEIGHT BEARING RESTRICTIONS: Yes >  2lbs  PAIN:  Are you having pain? Yes: NPRS scale: 7/10 Pain location: anterior shoulder girdel Pain description: sore Aggravating factors: movement Relieving factors: sleep  FALLS: Has patient fallen in last 6 months? Yes. Number of falls frequently  PLOF: Independent  PATIENT GOALS: To get back to where I was  NEXT MD VISIT: 12/19/22  OBJECTIVE:   HAND DOMINANCE: Right  ADLs: Overall ADLs: Mod assist for dressing and bathing, difficulty with cooking and cleaning due to weakness and protocol  FUNCTIONAL  OUTCOME MEASURES: FOTO: 40.12  UPPER EXTREMITY ROM:       Assessed in supine, er/IR adducted  Passive ROM Left eval  Shoulder flexion 118  Shoulder abduction 87  Shoulder internal rotation 90  Shoulder external rotation 27  (Blank rows = not tested)   Assessed in seated, er/IR adducted  Active ROM Left eval Left 12/21/22  Shoulder flexion  121  Shoulder abduction  99  Shoulder internal rotation  90  Shoulder external rotation  43  (Blank rows = not tested)    UPPER EXTREMITY MMT:     Assessed in seating, er/IR adducted  MMT Left 12/21/22  Shoulder flexion 4/5  Shoulder abduction 4/5  Shoulder internal rotation 4/5  Shoulder external rotation 3+/5  (Blank rows = not tested)  OBSERVATIONS: Moderate to severe fascial restrictions    TODAY'S TREATMENT:                                                                                                                              DATE:   12/21/22 -Manual Therapy: myofascial release and trigger point applied to biceps, deltoid, trapezius, and scapular region in order to reduce fascial restrictions and pain, as well as improving ROM.  -Wall Slides: flexion and abduction, x12 -A/ROM: seated-protraction, flexion, horizontal abduction, er, abduction, x15 -Pulleys: flexion, abduction, x60" -[facility]: flexion, abduction, x10 -Isometrics: flexion, extension, abduction, ir, 2x15"  12/19/22 -Manual Therapy: myofascial release and trigger point applied to biceps, deltoid, trapezius, and scapular region in order to reduce fascial restrictions and pain, as well as improving ROM.  -A/ROM: supine-protraction, flexion, horizontal abduction, er, abduction, x15 -Wall Slides: flexion and abduction, x12 -Isometrics: flexion, extension, abduction, ir, 4x15"  12/13/22 -Manual Therapy: myofascial release and trigger point applied to biceps, deltoid, trapezius, and scapular region in order to reduce fascial restrictions and pain, as well as  improving ROM.  -P/ROM: supine-flexion, abduction, er/IR, horizontal abduction -A/ROM: supine-protraction, flexion, horizontal abduction, er, abduction, 10 reps -Proximal shoulder strengthening: supine-paddles, criss cross, circles each direction, 10 reps each -Shoulder stretches: cross chest stretch, doorway stretch, flexion, 3x10" -A/ROM: supine-protraction, flexion, horizontal abduction, er, abduction, 10 reps -Functional Reaching: standing at overhead cabinet, placing 10 cones on top shelf in flexion and removing in abduction    PATIENT EDUCATION: Education details: Isometrics Person educated: Patient Education method: Programmer, multimedia, Demonstration, and Handouts Education comprehension: verbalized understanding and returned demonstration  HOME EXERCISE PROGRAM: 7/11: Tables slides and Pendulums 8/12:  AA/ROM 8/19: Wall Slides 8/21: A/ROM 9/3: Isometrics  GOALS: Goals reviewed with patient? Yes   SHORT TERM GOALS: Target date: 11/21/22  Pt will be provided with and educated on HEP to improve mobility in LUE required for use during ADL completion.   Goal status: IN PROGRESS  2.  Pt will increase :UE P/ROM by 50 degrees to improve ability to use LUE during dressing tasks with minimal compensatory techniques.   Goal status: IN PROGRESS  3.  Pt will increase LUE strength to 3+/5 to improve ability to reach for items at waist to chest height during bathing and grooming tasks.   Goal status: IN PROGRESS   LONG TERM GOALS: Target date: 12/22/22  Pt will decrease pain in LUE to 3/10 or less to improve ability to sleep for 2+ consecutive hours without waking due to pain.   Goal status: IN PROGRESS  2.  Pt will decrease LUE fascial restrictions to min amounts or less to improve mobility required for functional reaching tasks.   Goal status: IN PROGRESS  3.  Pt will increase LUE A/ROM by 50 degrees to improve ability to use LUE when reaching overhead or behind back during dressing  and bathing tasks.   Goal status: IN PROGRESS  4.  Pt will increase LUE strength to 4+/5 or greater to improve ability to use LLUE when lifting or carrying items during meal preparation/housework/yardwork tasks.   Goal status: IN PROGRESS  5.  Pt will return to highest level of function using LUE as non-dominant during functional task completion.   Goal status: IN PROGRESS   ASSESSMENT:  CLINICAL IMPRESSION: This session pt continuing to report increased pain and discomfort, however he was able to tolerate increased mobility with wall slides and pulleys, which allowed for further A/ROM at 75% of full ROM. Pt started ball rolls on the wall this session to further progress his stretching in flexion and abduction. OT providing verbal and tactile cuing for positioning and technique, as well as cuing to slow down with all exercises.   PERFORMANCE DEFICITS: in functional skills including in functional skills including ADLs, IADLs, coordination, tone, ROM, strength, pain, fascial restrictions, muscle spasms, and UE functional use.   PLAN:  OT FREQUENCY: 2x/week  OT DURATION: 8 weeks  PLANNED INTERVENTIONS: self care/ADL training, therapeutic exercise, therapeutic activity, neuromuscular re-education, manual therapy, passive range of motion, splinting, electrical stimulation, ultrasound, moist heat, cryotherapy, patient/family education, and DME and/or AE instructions  CONSULTED AND AGREED WITH PLAN OF CARE: Patient  PLAN FOR NEXT SESSION: Manual Therapy, A/ROM, follow protocol   Trish Mage, OTR/L [phone] 12/22/2022, 10:37 AM

## 2022-12-26 ENCOUNTER — Encounter (HOSPITAL_COMMUNITY): Payer: 59 | Admitting: Occupational Therapy

## 2023-01-02 ENCOUNTER — Encounter (HOSPITAL_COMMUNITY): Payer: Self-pay | Admitting: Occupational Therapy

## 2023-01-02 ENCOUNTER — Ambulatory Visit (HOSPITAL_COMMUNITY): Payer: 59 | Admitting: Occupational Therapy

## 2023-01-02 DIAGNOSIS — R29898 Other symptoms and signs involving the musculoskeletal system: Secondary | ICD-10-CM

## 2023-01-02 DIAGNOSIS — M25512 Pain in left shoulder: Secondary | ICD-10-CM

## 2023-01-02 DIAGNOSIS — M25612 Stiffness of left shoulder, not elsewhere classified: Secondary | ICD-10-CM

## 2023-01-02 NOTE — Therapy (Unsigned)
OUTPATIENT OCCUPATIONAL THERAPY ORTHO TREATMENT NOTE  Patient Name: Derrick Dean MRN: [id number] DOB:[birth year], 82 y.o., male Today's Date: 01/03/2023   END OF SESSION:  OT End of Session - 01/02/23 1435     Visit Number 12    Number of Visits 17    Date for OT Re-Evaluation 12/22/22    Authorization Type [employer] Dual Complete    Authorization Time Period auth requested 12/13/22    OT Start Time 1352    OT Stop Time 1435    OT Time Calculation (min) 43 min    Activity Tolerance Patient tolerated treatment well    Behavior During Therapy WFL for tasks assessed/performed             Past Medical History:  Diagnosis Date   Anemia    Appendicitis    Arthritis    B12 nutritional deficiency    on supplement   GERD (gastroesophageal reflux disease)    Gout    Hepatitis C infection    not treated - pending appointment in april 2015   History of heart artery stent    Hypertension    Presbyopia    wears bifocals   Trouble in sleeping    Past Surgical History:  Procedure Laterality Date   APPENDECTOMY     CARDIAC CATHETERIZATION     7 YRS AGO W/STENT   CHOLECYSTECTOMY     Left shoulder surgery     REVERSE SHOULDER ARTHROPLASTY Left 09/25/2022   Procedure: LEFT REVERSE SHOULDER ARTHROPLASTY;  Surgeon: Oliver Barre, MD;  Location: [facility];  Service: Orthopedics;  Laterality: Left;  RNFA NEEDED   Right Hip Replacement Right 01/27/2020   Right Shoulder surgery     TOTAL KNEE ARTHROPLASTY Bilateral    Patient Active Problem List   Diagnosis Date Noted   Osteoarthritis of hips, 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   Arthritis of left glenohumeral joint 09/25/2022   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   Other abnormalities of gait and mobility 01/03/2021   Gout 01/03/2021   Posttraumatic stress disorder 01/03/2021   Gastroesophageal reflux disease 01/03/2021   Hyperlipidemia 01/03/2021   Vitamin B12 deficiency 01/03/2021   Shoulder pain 01/03/2021   Recurrent major  depression (HCC) 01/03/2021   Coronary atherosclerosis 01/03/2021   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   Chronic midline low back pain without sciatica 05/24/2018   Lumbar spondylosis 05/24/2018   Pain syndrome, chronic 06/15/2017   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/15/2017   Sprain of right wrist 10/31/2016   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   S/P ORIF (open reduction internal fixation) fracture 05/05/2016   Closed fracture of right distal radius 04/18/2016   Common cold 09/25/2013   CAD S/P percutaneous coronary angioplasty 09/25/2013   HCV (hepatitis C virus) 05/15/2013   Cholelithiasis 05/15/2013   Abdominal pain, epigastric 05/15/2013   Pancreatitis, acute 05/13/2013   Unspecified essential hypertension 05/13/2013   Transaminasemia 05/13/2013   Chest pain 05/12/2013    PCP: Jeanice Lim [place] REFERRING PROVIDER: Thane Edu, MD  ONSET DATE: 09/26/22  REFERRING DIAG: L reverse total shoulder replacement  THERAPY DIAG:  Acute pain of left shoulder  Stiffness of left shoulder, not elsewhere classified  Other symptoms and signs involving the musculoskeletal system  Rationale for Evaluation and Treatment: Rehabilitation  SUBJECTIVE:   SUBJECTIVE STATEMENT: "I was so sick last week I didn't do any exercises"  PERTINENT HISTORY: Pt has had severe arthritis for years. No other PMH noted in chart.   PRECAUTIONS: Shoulder  WEIGHT BEARING RESTRICTIONS: Yes >2lbs  PAIN:  Are you having pain? Yes: NPRS scale: 7/10 Pain location: anterior shoulder girdel Pain description: sore Aggravating factors: movement Relieving factors: sleep  FALLS: Has patient fallen in last 6 months? Yes. Number of falls frequently  PLOF: Independent  PATIENT GOALS: To get back to where I was  NEXT MD VISIT: 12/19/22  OBJECTIVE:   HAND DOMINANCE: Right  ADLs: Overall ADLs: Mod assist for dressing and bathing, difficulty with cooking and cleaning due to weakness and  protocol  FUNCTIONAL OUTCOME MEASURES: FOTO: 40.12  UPPER EXTREMITY ROM:       Assessed in supine, er/IR adducted  Passive ROM Left eval  Shoulder flexion 118  Shoulder abduction 87  Shoulder internal rotation 90  Shoulder external rotation 27  (Blank rows = not tested)   Assessed in seated, er/IR adducted  Active ROM Left eval Left 12/21/22  Shoulder flexion  121  Shoulder abduction  99  Shoulder internal rotation  90  Shoulder external rotation  43  (Blank rows = not tested)    UPPER EXTREMITY MMT:     Assessed in seating, er/IR adducted  MMT Left 12/21/22  Shoulder flexion 4/5  Shoulder abduction 4/5  Shoulder internal rotation 4/5  Shoulder external rotation 3+/5  (Blank rows = not tested)  OBSERVATIONS: Moderate to severe fascial restrictions    TODAY'S TREATMENT:                                                                                                                              DATE:   01/02/23 -Manual Therapy: myofascial release and trigger point applied to biceps, deltoid, trapezius, and scapular region in order to reduce fascial restrictions and pain, as well as improving ROM. -A/ROM: supine, flexion, abduction, protraction,  horizontal abduction, er/IR, x12 -Proximal shoulder strengthening: supine-paddles, criss cross, circles each direction, 10 reps each -Shoulder stretches: cross chest stretch, doorway stretch, flexion, 3x10" -A/ROM: seated, flexion, abduction, protraction, horizontal abduction, er/IR, x10 -Overhead lacing  12/21/22 -Manual Therapy: myofascial release and trigger point applied to biceps, deltoid, trapezius, and scapular region in order to reduce fascial restrictions and pain, as well as improving ROM.  -Wall Slides: flexion and abduction, x12 -A/ROM: seated-protraction, flexion, horizontal abduction, er, abduction, x15 -Pulleys: flexion, abduction, x60" -[facility]: flexion, abduction, x10 -Isometrics: flexion, extension,  abduction, ir, 2x15"  12/19/22 -Manual Therapy: myofascial release and trigger point applied to biceps, deltoid, trapezius, and scapular region in order to reduce fascial restrictions and pain, as well as improving ROM.  -A/ROM: supine-protraction, flexion, horizontal abduction, er, abduction, x15 -Wall Slides: flexion and abduction, x12 -Isometrics: flexion, extension, abduction, ir, 4x15"    PATIENT EDUCATION: Education details: Continue HEP Person educated: Patient Education method: Explanation, Demonstration, and Handouts Education comprehension: verbalized understanding and returned demonstration  HOME EXERCISE PROGRAM: 7/11: Tables slides and Pendulums 8/12: AA/ROM 8/19: Wall Slides 8/21: A/ROM 9/3: Isometrics  GOALS: Goals reviewed with patient? Yes  SHORT TERM GOALS: Target date: 11/21/22  Pt will be provided with and educated on HEP to improve mobility in LUE required for use during ADL completion.   Goal status: IN PROGRESS  2.  Pt will increase :UE P/ROM by 50 degrees to improve ability to use LUE during dressing tasks with minimal compensatory techniques.   Goal status: IN PROGRESS  3.  Pt will increase LUE strength to 3+/5 to improve ability to reach for items at waist to chest height during bathing and grooming tasks.   Goal status: IN PROGRESS   LONG TERM GOALS: Target date: 12/22/22  Pt will decrease pain in LUE to 3/10 or less to improve ability to sleep for 2+ consecutive hours without waking due to pain.   Goal status: IN PROGRESS  2.  Pt will decrease LUE fascial restrictions to min amounts or less to improve mobility required for functional reaching tasks.   Goal status: IN PROGRESS  3.  Pt will increase LUE A/ROM by 50 degrees to improve ability to use LUE when reaching overhead or behind back during dressing and bathing tasks.   Goal status: IN PROGRESS  4.  Pt will increase LUE strength to 4+/5 or greater to improve ability to use LLUE when  lifting or carrying items during meal preparation/housework/yardwork tasks.   Goal status: IN PROGRESS  5.  Pt will return to highest level of function using LUE as non-dominant during functional task completion.   Goal status: IN PROGRESS   ASSESSMENT:  CLINICAL IMPRESSION: Pt demonstrating increased pain and stiffness this session. He was able to achieve 75% of full ROM actively after increased movement through the beginning of the session. Pt then complete proximal shoulder exercises, in which he required multiple breaks and reported that it increased his pain. OT providing verbal and tactile cuing throughout session for positioning and technique.   PERFORMANCE DEFICITS: in functional skills including in functional skills including ADLs, IADLs, coordination, tone, ROM, strength, pain, fascial restrictions, muscle spasms, and UE functional use.   PLAN:  OT FREQUENCY: 2x/week  OT DURATION: 8 weeks  PLANNED INTERVENTIONS: self care/ADL training, therapeutic exercise, therapeutic activity, neuromuscular re-education, manual therapy, passive range of motion, splinting, electrical stimulation, ultrasound, moist heat, cryotherapy, patient/family education, and DME and/or AE instructions  CONSULTED AND AGREED WITH PLAN OF CARE: Patient  PLAN FOR NEXT SESSION: Manual Therapy, A/ROM, follow protocol   Trish Mage, OTR/L [phone] 01/03/2023, 11:48 AM

## 2023-01-08 ENCOUNTER — Encounter (HOSPITAL_COMMUNITY): Payer: 59 | Admitting: Occupational Therapy

## 2023-01-10 ENCOUNTER — Encounter (HOSPITAL_COMMUNITY): Payer: 59 | Admitting: Occupational Therapy

## 2023-01-15 ENCOUNTER — Encounter (HOSPITAL_COMMUNITY): Payer: Self-pay | Admitting: Occupational Therapy

## 2023-01-15 ENCOUNTER — Ambulatory Visit (HOSPITAL_COMMUNITY): Payer: 59 | Admitting: Occupational Therapy

## 2023-01-15 DIAGNOSIS — M25612 Stiffness of left shoulder, not elsewhere classified: Secondary | ICD-10-CM

## 2023-01-15 DIAGNOSIS — M25512 Pain in left shoulder: Secondary | ICD-10-CM

## 2023-01-15 DIAGNOSIS — R29898 Other symptoms and signs involving the musculoskeletal system: Secondary | ICD-10-CM

## 2023-01-15 NOTE — Therapy (Signed)
OUTPATIENT OCCUPATIONAL THERAPY ORTHO TREATMENT NOTE  Patient Name: Derrick Dean MRN: [id number] DOB:1940/07/13, 82 y.o., male Today's Date: 01/15/2023   END OF SESSION:  OT End of Session - 01/15/23 1424     Visit Number 13    Number of Visits 17    Date for OT Re-Evaluation 01/26/23    Authorization Type [employer] Dual Complete    OT Start Time 1307    OT Stop Time 1347    OT Time Calculation (min) 40 min    Activity Tolerance Patient tolerated treatment well    Behavior During Therapy WFL for tasks assessed/performed              Past Medical History:  Diagnosis Date   Anemia    Appendicitis    Arthritis    B12 nutritional deficiency    on supplement   GERD (gastroesophageal reflux disease)    Gout    Hepatitis C infection    not treated - pending appointment in april 2015   History of heart artery stent    Hypertension    Presbyopia    wears bifocals   Trouble in sleeping    Past Surgical History:  Procedure Laterality Date   APPENDECTOMY     CARDIAC CATHETERIZATION     7 YRS AGO W/STENT   CHOLECYSTECTOMY     Left shoulder surgery     REVERSE SHOULDER ARTHROPLASTY Left 09/25/2022   Procedure: LEFT REVERSE SHOULDER ARTHROPLASTY;  Surgeon: Oliver Barre, MD;  Location: [facility];  Service: Orthopedics;  Laterality: Left;  RNFA NEEDED   Right Hip Replacement Right 01/27/2020   Right Shoulder surgery     TOTAL KNEE ARTHROPLASTY Bilateral    Patient Active Problem List   Diagnosis Date Noted   Osteoarthritis of hips, 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   Arthritis of left glenohumeral joint 09/25/2022   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   Other abnormalities of gait and mobility 01/03/2021   Gout 01/03/2021   Posttraumatic stress disorder 01/03/2021   Gastroesophageal reflux disease 01/03/2021   Hyperlipidemia 01/03/2021   Vitamin B12 deficiency 01/03/2021   Shoulder pain 01/03/2021   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   Coronary atherosclerosis  01/03/2021   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   Chronic midline low back pain without sciatica 05/24/2018   Lumbar spondylosis 05/24/2018   Pain syndrome, chronic 06/15/2017   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/15/2017   Sprain of right wrist 10/31/2016   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   S/P ORIF (open reduction internal fixation) fracture 05/05/2016   Closed fracture of right distal radius 04/18/2016   Common cold 09/25/2013   CAD S/P percutaneous coronary angioplasty 09/25/2013   HCV (hepatitis C virus) 05/15/2013   Cholelithiasis 05/15/2013   Abdominal pain, epigastric 05/15/2013   Pancreatitis, acute 05/13/2013   Unspecified essential hypertension 05/13/2013   Transaminasemia 05/13/2013   Chest pain 05/12/2013    PCP: Jeanice Lim [place] REFERRING PROVIDER: Thane Edu, MD  ONSET DATE: 09/26/22  REFERRING DIAG: L reverse total shoulder replacement  THERAPY DIAG:  Acute pain of left shoulder  Stiffness of left shoulder, not elsewhere classified  Other symptoms and signs involving the musculoskeletal system  Rationale for Evaluation and Treatment: Rehabilitation  SUBJECTIVE:   SUBJECTIVE STATEMENT: "I don't do a lot when I hurt"  PERTINENT HISTORY: Pt has had severe arthritis for years. No other PMH noted in chart.   PRECAUTIONS: Shoulder  WEIGHT BEARING RESTRICTIONS: Yes >2lbs  PAIN:  Are you having  pain? Yes: NPRS scale: 7/10 Pain location: anterior shoulder girdel Pain description: sore Aggravating factors: movement Relieving factors: sleep  FALLS: Has patient fallen in last 6 months? Yes. Number of falls frequently  PLOF: Independent  PATIENT GOALS: To get back to where I was  NEXT MD VISIT: 12/19/22  OBJECTIVE:   HAND DOMINANCE: Right  ADLs: Overall ADLs: Mod assist for dressing and bathing, difficulty with cooking and cleaning due to weakness and protocol  FUNCTIONAL OUTCOME MEASURES: FOTO: 40.12  UPPER EXTREMITY ROM:        Assessed in supine, er/IR adducted  Passive ROM Left eval  Shoulder flexion 118  Shoulder abduction 87  Shoulder internal rotation 90  Shoulder external rotation 27  (Blank rows = not tested)   Assessed in seated, er/IR adducted  Active ROM Left eval Left 12/21/22  Shoulder flexion  121  Shoulder abduction  99  Shoulder internal rotation  90  Shoulder external rotation  43  (Blank rows = not tested)    UPPER EXTREMITY MMT:     Assessed in seating, er/IR adducted  MMT Left 12/21/22  Shoulder flexion 4/5  Shoulder abduction 4/5  Shoulder internal rotation 4/5  Shoulder external rotation 3+/5  (Blank rows = not tested)  OBSERVATIONS: Moderate to severe fascial restrictions    TODAY'S TREATMENT:                                                                                                                              DATE:   01/15/23 -Manual Therapy: myofascial release and trigger point applied to biceps, deltoid, trapezius, and scapular region in order to reduce fascial restrictions and pain, as well as improving ROM. -A/ROM: supine, flexion, abduction, protraction,  horizontal abduction, er/IR, x12 -scapular strengthening: green band, extension, retraction, rows, x10 -ABC's on the wall, red weighted ball -UBE: level 1, 2' forward and backwards  01/02/23 -Manual Therapy: myofascial release and trigger point applied to biceps, deltoid, trapezius, and scapular region in order to reduce fascial restrictions and pain, as well as improving ROM. -A/ROM: supine, flexion, abduction, protraction,  horizontal abduction, er/IR, x12 -Proximal shoulder strengthening: supine-paddles, criss cross, circles each direction, 10 reps each -Shoulder stretches: cross chest stretch, doorway stretch, flexion, 3x10" -A/ROM: seated, flexion, abduction, protraction, horizontal abduction, er/IR, x10 -Overhead lacing  12/21/22 -Manual Therapy: myofascial release and trigger point applied to  biceps, deltoid, trapezius, and scapular region in order to reduce fascial restrictions and pain, as well as improving ROM.  -Wall Slides: flexion and abduction, x12 -A/ROM: seated-protraction, flexion, horizontal abduction, er, abduction, x15 -Pulleys: flexion, abduction, x60" -[facility]: flexion, abduction, x10 -Isometrics: flexion, extension, abduction, ir, 2x15"   PATIENT EDUCATION: Education details: Publishing rights manager Person educated: Patient Education method: Explanation, Demonstration, and Handouts Education comprehension: verbalized understanding and returned demonstration  HOME EXERCISE PROGRAM: 7/11: Tables slides and Pendulums 8/12: AA/ROM 8/19: Wall Slides 8/21: A/ROM 9/3: Isometrics 9/30: Scapular strengthening   GOALS: Goals reviewed with patient?  Yes   SHORT TERM GOALS: Target date: 11/21/22  Pt will be provided with and educated on HEP to improve mobility in LUE required for use during ADL completion.   Goal status: IN PROGRESS  2.  Pt will increase :UE P/ROM by 50 degrees to improve ability to use LUE during dressing tasks with minimal compensatory techniques.   Goal status: IN PROGRESS  3.  Pt will increase LUE strength to 3+/5 to improve ability to reach for items at waist to chest height during bathing and grooming tasks.   Goal status: IN PROGRESS   LONG TERM GOALS: Target date: 12/22/22  Pt will decrease pain in LUE to 3/10 or less to improve ability to sleep for 2+ consecutive hours without waking due to pain.   Goal status: IN PROGRESS  2.  Pt will decrease LUE fascial restrictions to min amounts or less to improve mobility required for functional reaching tasks.   Goal status: IN PROGRESS  3.  Pt will increase LUE A/ROM by 50 degrees to improve ability to use LUE when reaching overhead or behind back during dressing and bathing tasks.   Goal status: IN PROGRESS  4.  Pt will increase LUE strength to 4+/5 or greater to improve ability to  use LLUE when lifting or carrying items during meal preparation/housework/yardwork tasks.   Goal status: IN PROGRESS  5.  Pt will return to highest level of function using LUE as non-dominant during functional task completion.   Goal status: IN PROGRESS   ASSESSMENT:  CLINICAL IMPRESSION: This session, pt demonstrating improving ROM, achieving approximately 75% of full ROM with all motions. Pt reporting increased pain after ROM, however was able to continue with initiating scapular strengthening and the UBE for strengthening and endurance. OT providing verbal and tactile cuing for positioning and technique.   PERFORMANCE DEFICITS: in functional skills including in functional skills including ADLs, IADLs, coordination, tone, ROM, strength, pain, fascial restrictions, muscle spasms, and UE functional use.   PLAN:  OT FREQUENCY: 2x/week  OT DURATION: 8 weeks  PLANNED INTERVENTIONS: self care/ADL training, therapeutic exercise, therapeutic activity, neuromuscular re-education, manual therapy, passive range of motion, splinting, electrical stimulation, ultrasound, moist heat, cryotherapy, patient/family education, and DME and/or AE instructions  CONSULTED AND AGREED WITH PLAN OF CARE: Patient  PLAN FOR NEXT SESSION: Manual Therapy, A/ROM, follow protocol   Trish Mage, OTR/L [phone] 01/15/2023, 2:26 PM

## 2023-01-15 NOTE — Patient Instructions (Signed)

## 2023-01-17 ENCOUNTER — Encounter (HOSPITAL_COMMUNITY): Payer: Self-pay | Admitting: Occupational Therapy

## 2023-01-17 ENCOUNTER — Ambulatory Visit (HOSPITAL_COMMUNITY): Payer: 59 | Attending: Orthopedic Surgery | Admitting: Occupational Therapy

## 2023-01-17 DIAGNOSIS — M25612 Stiffness of left shoulder, not elsewhere classified: Secondary | ICD-10-CM | POA: Diagnosis present

## 2023-01-17 DIAGNOSIS — R29898 Other symptoms and signs involving the musculoskeletal system: Secondary | ICD-10-CM | POA: Insufficient documentation

## 2023-01-17 DIAGNOSIS — M25512 Pain in left shoulder: Secondary | ICD-10-CM | POA: Insufficient documentation

## 2023-01-17 NOTE — Therapy (Unsigned)
OUTPATIENT OCCUPATIONAL THERAPY ORTHO TREATMENT NOTE  Patient Name: Derrick Dean MRN: [id number] DOB:11-May-1940, 82 y.o., male Today's Date: 01/18/2023   END OF SESSION:  OT End of Session - 01/17/23 1431     Visit Number 14    Number of Visits 17    Date for OT Re-Evaluation 01/26/23    Authorization Type [employer] Dual Complete    OT Start Time 1353    OT Stop Time 1431    OT Time Calculation (min) 38 min    Activity Tolerance Patient tolerated treatment well    Behavior During Therapy WFL for tasks assessed/performed             Past Medical History:  Diagnosis Date   Anemia    Appendicitis    Arthritis    B12 nutritional deficiency    on supplement   GERD (gastroesophageal reflux disease)    Gout    Hepatitis C infection    not treated - pending appointment in april 2015   History of heart artery stent    Hypertension    Presbyopia    wears bifocals   Trouble in sleeping    Past Surgical History:  Procedure Laterality Date   APPENDECTOMY     CARDIAC CATHETERIZATION     7 YRS AGO W/STENT   CHOLECYSTECTOMY     Left shoulder surgery     REVERSE SHOULDER ARTHROPLASTY Left 09/25/2022   Procedure: LEFT REVERSE SHOULDER ARTHROPLASTY;  Surgeon: Oliver Barre, MD;  Location: [facility];  Service: Orthopedics;  Laterality: Left;  RNFA NEEDED   Right Hip Replacement Right 01/27/2020   Right Shoulder surgery     TOTAL KNEE ARTHROPLASTY Bilateral    Patient Active Problem List   Diagnosis Date Noted   Osteoarthritis of hips, 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   Arthritis of left glenohumeral joint 09/25/2022   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   Other abnormalities of gait and mobility 01/03/2021   Gout 01/03/2021   Posttraumatic stress disorder 01/03/2021   Gastroesophageal reflux disease 01/03/2021   Hyperlipidemia 01/03/2021   Vitamin B12 deficiency 01/03/2021   Shoulder pain 01/03/2021   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   Coronary atherosclerosis  01/03/2021   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   Chronic midline low back pain without sciatica 05/24/2018   Lumbar spondylosis 05/24/2018   Pain syndrome, chronic 06/15/2017   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/15/2017   Sprain of right wrist 10/31/2016   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   S/P ORIF (open reduction internal fixation) fracture 05/05/2016   Closed fracture of right distal radius 04/18/2016   Common cold 09/25/2013   CAD S/P percutaneous coronary angioplasty 09/25/2013   HCV (hepatitis C virus) 05/15/2013   Cholelithiasis 05/15/2013   Abdominal pain, epigastric 05/15/2013   Pancreatitis, acute 05/13/2013   Essential hypertension 05/13/2013   Transaminasemia 05/13/2013   Chest pain 05/12/2013    PCP: Jeanice Lim [place] REFERRING PROVIDER: Thane Edu, MD  ONSET DATE: 09/26/22  REFERRING DIAG: L reverse total shoulder replacement  THERAPY DIAG:  Acute pain of left shoulder  Stiffness of left shoulder, not elsewhere classified  Other symptoms and signs involving the musculoskeletal system  Rationale for Evaluation and Treatment: Rehabilitation  SUBJECTIVE:   SUBJECTIVE STATEMENT: "I don't do a lot when I hurt"  PERTINENT HISTORY: Pt has had severe arthritis for years. No other PMH noted in chart.   PRECAUTIONS: Shoulder  WEIGHT BEARING RESTRICTIONS: Yes >2lbs  PAIN:  Are you having pain? Yes:  NPRS scale: 7/10 Pain location: anterior shoulder girdel Pain description: sore Aggravating factors: movement Relieving factors: sleep  FALLS: Has patient fallen in last 6 months? Yes. Number of falls frequently  PLOF: Independent  PATIENT GOALS: To get back to where I was  NEXT MD VISIT: 12/19/22  OBJECTIVE:   HAND DOMINANCE: Right  ADLs: Overall ADLs: Mod assist for dressing and bathing, difficulty with cooking and cleaning due to weakness and protocol  FUNCTIONAL OUTCOME MEASURES: FOTO: 40.12  UPPER EXTREMITY ROM:       Assessed  in supine, er/IR adducted  Passive ROM Left eval  Shoulder flexion 118  Shoulder abduction 87  Shoulder internal rotation 90  Shoulder external rotation 27  (Blank rows = not tested)   Assessed in seated, er/IR adducted  Active ROM Left eval Left 12/21/22  Shoulder flexion  121  Shoulder abduction  99  Shoulder internal rotation  90  Shoulder external rotation  43  (Blank rows = not tested)    UPPER EXTREMITY MMT:     Assessed in seating, er/IR adducted  MMT Left 12/21/22  Shoulder flexion 4/5  Shoulder abduction 4/5  Shoulder internal rotation 4/5  Shoulder external rotation 3+/5  (Blank rows = not tested)  OBSERVATIONS: Moderate to severe fascial restrictions    TODAY'S TREATMENT:                                                                                                                              DATE:   01/17/23 -Manual Therapy: myofascial release and trigger point applied to biceps, deltoid, trapezius, and scapular region in order to reduce fascial restrictions and pain, as well as improving ROM. -A/ROM: side lying, flexion, abduction, protraction,  horizontal abduction, er/IR, x10 -A/ROM: supine, flexion, abduction, protraction,  horizontal abduction, er/IR, x10 -Proximal shoulder strengthening: supine-paddles, criss cross, circles each direction, 10 reps each -Latissimus stretch: switching hand on top, 4x30"  01/15/23 -Manual Therapy: myofascial release and trigger point applied to biceps, deltoid, trapezius, and scapular region in order to reduce fascial restrictions and pain, as well as improving ROM. -A/ROM: supine, flexion, abduction, protraction,  horizontal abduction, er/IR, x12 -scapular strengthening: green band, extension, retraction, rows, x10 -ABC's on the wall, red weighted ball -UBE: level 1, 2' forward and backwards  01/02/23 -Manual Therapy: myofascial release and trigger point applied to biceps, deltoid, trapezius, and scapular region in  order to reduce fascial restrictions and pain, as well as improving ROM. -A/ROM: supine, flexion, abduction, protraction,  horizontal abduction, er/IR, x12 -Proximal shoulder strengthening: supine-paddles, criss cross, circles each direction, 10 reps each -Shoulder stretches: cross chest stretch, doorway stretch, flexion, 3x10" -A/ROM: seated, flexion, abduction, protraction, horizontal abduction, er/IR, x10 -Overhead lacing   PATIENT EDUCATION: Education details: Continue HEP Person educated: Patient Education method: Explanation, Demonstration, and Handouts Education comprehension: verbalized understanding and returned demonstration  HOME EXERCISE PROGRAM: 7/11: Tables slides and Pendulums 8/12: AA/ROM 8/19: Wall Slides 8/21: A/ROM 9/3: Isometrics 9/30: Scapular  strengthening   GOALS: Goals reviewed with patient? Yes   SHORT TERM GOALS: Target date: 11/21/22  Pt will be provided with and educated on HEP to improve mobility in LUE required for use during ADL completion.   Goal status: IN PROGRESS  2.  Pt will increase :UE P/ROM by 50 degrees to improve ability to use LUE during dressing tasks with minimal compensatory techniques.   Goal status: IN PROGRESS  3.  Pt will increase LUE strength to 3+/5 to improve ability to reach for items at waist to chest height during bathing and grooming tasks.   Goal status: IN PROGRESS   LONG TERM GOALS: Target date: 12/22/22  Pt will decrease pain in LUE to 3/10 or less to improve ability to sleep for 2+ consecutive hours without waking due to pain.   Goal status: IN PROGRESS  2.  Pt will decrease LUE fascial restrictions to min amounts or less to improve mobility required for functional reaching tasks.   Goal status: IN PROGRESS  3.  Pt will increase LUE A/ROM by 50 degrees to improve ability to use LUE when reaching overhead or behind back during dressing and bathing tasks.   Goal status: IN PROGRESS  4.  Pt will increase LUE  strength to 4+/5 or greater to improve ability to use LLUE when lifting or carrying items during meal preparation/housework/yardwork tasks.   Goal status: IN PROGRESS  5.  Pt will return to highest level of function using LUE as non-dominant during functional task completion.   Goal status: IN PROGRESS   ASSESSMENT:  CLINICAL IMPRESSION: This session, pt continuing to work on ROM. He achieved 65%of full ROM in side lying and 55% of full ROM in supine. Despite max encouragement, pt reports he is unable to push further due to pain. OT adding proximal shoulder strengthening for stability and strength, as well as completing a latissimus stretch due to stiffness and complaints of pain through scapula. Verbal and tactile cuing for positioning and technique.   PERFORMANCE DEFICITS: in functional skills including in functional skills including ADLs, IADLs, coordination, tone, ROM, strength, pain, fascial restrictions, muscle spasms, and UE functional use.   PLAN:  OT FREQUENCY: 2x/week  OT DURATION: 8 weeks  PLANNED INTERVENTIONS: self care/ADL training, therapeutic exercise, therapeutic activity, neuromuscular re-education, manual therapy, passive range of motion, splinting, electrical stimulation, ultrasound, moist heat, cryotherapy, patient/family education, and DME and/or AE instructions  CONSULTED AND AGREED WITH PLAN OF CARE: Patient  PLAN FOR NEXT SESSION: Manual Therapy, A/ROM, follow protocol   Trish Mage, OTR/L [phone] 01/18/2023, 8:55 PM

## 2023-01-22 ENCOUNTER — Encounter (HOSPITAL_COMMUNITY): Payer: 59 | Admitting: Occupational Therapy

## 2023-01-24 ENCOUNTER — Encounter (HOSPITAL_COMMUNITY): Payer: 59 | Admitting: Occupational Therapy

## 2023-01-25 ENCOUNTER — Encounter (HOSPITAL_COMMUNITY): Payer: Self-pay | Admitting: Occupational Therapy

## 2023-01-25 ENCOUNTER — Ambulatory Visit (HOSPITAL_COMMUNITY): Payer: 59 | Admitting: Occupational Therapy

## 2023-01-25 DIAGNOSIS — M25512 Pain in left shoulder: Secondary | ICD-10-CM

## 2023-01-25 DIAGNOSIS — M25612 Stiffness of left shoulder, not elsewhere classified: Secondary | ICD-10-CM

## 2023-01-25 DIAGNOSIS — R29898 Other symptoms and signs involving the musculoskeletal system: Secondary | ICD-10-CM

## 2023-01-25 NOTE — Therapy (Signed)
OUTPATIENT OCCUPATIONAL THERAPY ORTHO TREATMENT NOTE  Patient Name: Derrick Dean MRN: [id number] DOB:02-Oct-1940, 82 y.o., male Today's Date: 01/25/2023   END OF SESSION:  OT End of Session - 01/25/23 1430     Visit Number 15    Number of Visits 17    Date for OT Re-Evaluation 01/26/23    Authorization Type [employer] Dual Complete    OT Start Time 1350    OT Stop Time 1430    OT Time Calculation (min) 40 min    Activity Tolerance Patient tolerated treatment well    Behavior During Therapy WFL for tasks assessed/performed              Past Medical History:  Diagnosis Date   Anemia    Appendicitis    Arthritis    B12 nutritional deficiency    on supplement   GERD (gastroesophageal reflux disease)    Gout    Hepatitis C infection    not treated - pending appointment in april 2015   History of heart artery stent    Hypertension    Presbyopia    wears bifocals   Trouble in sleeping    Past Surgical History:  Procedure Laterality Date   APPENDECTOMY     CARDIAC CATHETERIZATION     7 YRS AGO W/STENT   CHOLECYSTECTOMY     Left shoulder surgery     REVERSE SHOULDER ARTHROPLASTY Left 09/25/2022   Procedure: LEFT REVERSE SHOULDER ARTHROPLASTY;  Surgeon: Oliver Barre, MD;  Location: [facility];  Service: Orthopedics;  Laterality: Left;  RNFA NEEDED   Right Hip Replacement Right 01/27/2020   Right Shoulder surgery     TOTAL KNEE ARTHROPLASTY Bilateral    Patient Active Problem List   Diagnosis Date Noted   Osteoarthritis of hips, 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   Arthritis of left glenohumeral joint 09/25/2022   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   Other abnormalities of gait and mobility 01/03/2021   Gout 01/03/2021   Posttraumatic stress disorder 01/03/2021   Gastroesophageal reflux disease 01/03/2021   Hyperlipidemia 01/03/2021   Vitamin B12 deficiency 01/03/2021   Shoulder pain 01/03/2021   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   Coronary atherosclerosis  01/03/2021   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   Chronic midline low back pain without sciatica 05/24/2018   Lumbar spondylosis 05/24/2018   Pain syndrome, chronic 06/15/2017   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/15/2017   Sprain of right wrist 10/31/2016   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   S/P ORIF (open reduction internal fixation) fracture 05/05/2016   Closed fracture of right distal radius 04/18/2016   Common cold 09/25/2013   CAD S/P percutaneous coronary angioplasty 09/25/2013   HCV (hepatitis C virus) 05/15/2013   Cholelithiasis 05/15/2013   Abdominal pain, epigastric 05/15/2013   Pancreatitis, acute 05/13/2013   Essential hypertension 05/13/2013   Transaminasemia 05/13/2013   Chest pain 05/12/2013    PCP: Jeanice Lim [place] REFERRING PROVIDER: Thane Edu, MD  ONSET DATE: 09/26/22  REFERRING DIAG: L reverse total shoulder replacement  THERAPY DIAG:  Acute pain of left shoulder  Stiffness of left shoulder, not elsewhere classified  Other symptoms and signs involving the musculoskeletal system  Rationale for Evaluation and Treatment: Rehabilitation  SUBJECTIVE:   SUBJECTIVE STATEMENT: "I think massaging it has helped a lot"  PERTINENT HISTORY: Pt has had severe arthritis for years. No other PMH noted in chart.   PRECAUTIONS: Shoulder  WEIGHT BEARING RESTRICTIONS: Yes >2lbs  PAIN:  Are you having pain?  Yes: NPRS scale: 5/10 Pain location: anterior shoulder girdel Pain description: sore Aggravating factors: movement Relieving factors: sleep  FALLS: Has patient fallen in last 6 months? Yes. Number of falls frequently  PLOF: Independent  PATIENT GOALS: To get back to where I was  NEXT MD VISIT: 12/19/22  OBJECTIVE:   HAND DOMINANCE: Right  ADLs: Overall ADLs: Mod assist for dressing and bathing, difficulty with cooking and cleaning due to weakness and protocol  FUNCTIONAL OUTCOME MEASURES: FOTO: 40.12 01/25/23: 62/100  UPPER  EXTREMITY ROM:       Assessed in supine, er/IR adducted  Passive ROM Left eval  Shoulder flexion 118  Shoulder abduction 87  Shoulder internal rotation 90  Shoulder external rotation 27  (Blank rows = not tested)   Assessed in seated, er/IR adducted  Active ROM Left eval Left 12/21/22 Left 01/25/23  Shoulder flexion  121 136  Shoulder abduction  99 118  Shoulder internal rotation  90 90  Shoulder external rotation  43 50  (Blank rows = not tested)    UPPER EXTREMITY MMT:     Assessed in seating, er/IR adducted  MMT Left 12/21/22 Left 01/25/23  Shoulder flexion 4/5 4+/5  Shoulder abduction 4/5 4+/5  Shoulder internal rotation 4/5 4/5  Shoulder external rotation 3+/5 4-/5  (Blank rows = not tested)  OBSERVATIONS: Moderate to severe fascial restrictions    TODAY'S TREATMENT:                                                                                                                              DATE:   01/25/23 -Manual Therapy: myofascial release and trigger point applied to biceps, deltoid, trapezius, and scapular region in order to reduce fascial restrictions and pain, as well as improving ROM. -AA/ROM: supine, flexion, abduction, protraction, horizontal abduction, er/IR, x10 -A/ROM: supine, flexion, abduction, protraction, horizontal abduction, er/IR, x10 -Measurements and FOTO for Reassessment -scapular strengthening: green band, extension, retraction, rows, x10  01/17/23 -Manual Therapy: myofascial release and trigger point applied to biceps, deltoid, trapezius, and scapular region in order to reduce fascial restrictions and pain, as well as improving ROM. -A/ROM: side lying, flexion, abduction, protraction,  horizontal abduction, er/IR, x10 -A/ROM: supine, flexion, abduction, protraction,  horizontal abduction, er/IR, x10 -Proximal shoulder strengthening: supine-paddles, criss cross, circles each direction, 10 reps each -Latissimus stretch: switching hand on  top, 4x30"  01/15/23 -Manual Therapy: myofascial release and trigger point applied to biceps, deltoid, trapezius, and scapular region in order to reduce fascial restrictions and pain, as well as improving ROM. -A/ROM: supine, flexion, abduction, protraction,  horizontal abduction, er/IR, x12 -scapular strengthening: green band, extension, retraction, rows, x10 -ABC's on the wall, red weighted ball -UBE: level 1, 2' forward and backwards   PATIENT EDUCATION: Education details: Continue HEP Person educated: Patient Education method: Explanation, Demonstration, and Handouts Education comprehension: verbalized understanding and returned demonstration  HOME EXERCISE PROGRAM: 7/11: Tables slides and Pendulums 8/12: AA/ROM 8/19: Wall Slides 8/21: A/ROM  9/3: Isometrics 9/30: Scapular strengthening   GOALS: Goals reviewed with patient? Yes   SHORT TERM GOALS: Target date: 11/21/22  Pt will be provided with and educated on HEP to improve mobility in LUE required for use during ADL completion.   Goal status: IN PROGRESS  2.  Pt will increase :UE P/ROM by 50 degrees to improve ability to use LUE during dressing tasks with minimal compensatory techniques.   Goal status: IN PROGRESS  3.  Pt will increase LUE strength to 3+/5 to improve ability to reach for items at waist to chest height during bathing and grooming tasks.   Goal status: IN PROGRESS   LONG TERM GOALS: Target date: 12/22/22  Pt will decrease pain in LUE to 3/10 or less to improve ability to sleep for 2+ consecutive hours without waking due to pain.   Goal status: IN PROGRESS  2.  Pt will decrease LUE fascial restrictions to min amounts or less to improve mobility required for functional reaching tasks.   Goal status: IN PROGRESS  3.  Pt will increase LUE A/ROM by 50 degrees to improve ability to use LUE when reaching overhead or behind back during dressing and bathing tasks.   Goal status: IN PROGRESS  4.  Pt will  increase LUE strength to 4+/5 or greater to improve ability to use LLUE when lifting or carrying items during meal preparation/housework/yardwork tasks.   Goal status: IN PROGRESS  5.  Pt will return to highest level of function using LUE as non-dominant during functional task completion.   Goal status: IN PROGRESS   ASSESSMENT:  CLINICAL IMPRESSION: Pt is continuing to work on ROM. He is demonstrating improved ROM and strength this session. With his reassessment, he is able to achieve 65-70% of full ROM actively and strength has improved in all motions. Pt continues to have discomfort and pain with movement. OT recommending pt continuing 2x/week for 4 weeks, to improve strength and stability. Verbal and tactile cuing provided for positioning and technique, throughout session.   PERFORMANCE DEFICITS: in functional skills including in functional skills including ADLs, IADLs, coordination, tone, ROM, strength, pain, fascial restrictions, muscle spasms, and UE functional use.   PLAN:  OT FREQUENCY: 2x/week  OT DURATION: 8 weeks  PLANNED INTERVENTIONS: self care/ADL training, therapeutic exercise, therapeutic activity, neuromuscular re-education, manual therapy, passive range of motion, splinting, electrical stimulation, ultrasound, moist heat, cryotherapy, patient/family education, and DME and/or AE instructions  CONSULTED AND AGREED WITH PLAN OF CARE: Patient  PLAN FOR NEXT SESSION: Manual Therapy, A/ROM, follow protocol   Trish Mage, OTR/L [phone] 01/25/2023, 3:59 PM

## 2023-01-25 NOTE — Patient Instructions (Signed)

## 2023-01-29 ENCOUNTER — Encounter (HOSPITAL_COMMUNITY): Payer: 59 | Admitting: Occupational Therapy

## 2023-01-29 ENCOUNTER — Ambulatory Visit (HOSPITAL_COMMUNITY): Payer: 59 | Admitting: Occupational Therapy

## 2023-01-29 ENCOUNTER — Encounter (HOSPITAL_COMMUNITY): Payer: Self-pay | Admitting: Occupational Therapy

## 2023-01-29 DIAGNOSIS — M25612 Stiffness of left shoulder, not elsewhere classified: Secondary | ICD-10-CM

## 2023-01-29 DIAGNOSIS — M25512 Pain in left shoulder: Secondary | ICD-10-CM

## 2023-01-29 DIAGNOSIS — R29898 Other symptoms and signs involving the musculoskeletal system: Secondary | ICD-10-CM

## 2023-01-29 NOTE — Addendum Note (Signed)
Addended by: Trish Mage E on: 01/29/2023 03:21 PM   Modules accepted: Orders

## 2023-01-29 NOTE — Therapy (Signed)
OUTPATIENT OCCUPATIONAL THERAPY ORTHO TREATMENT NOTE  Patient Name: Derrick Dean MRN: [id number] DOB:[birth year], 82 y.o., male Today's Date: 01/29/2023   END OF SESSION:  OT End of Session - 01/29/23 1514     Visit Number 16    Number of Visits 17    Date for OT Re-Evaluation 01/26/23    Authorization Type [employer] Dual Complete    OT Start Time 1448   pt arrived late   OT Stop Time 1515    OT Time Calculation (min) 27 min    Activity Tolerance Patient tolerated treatment well    Behavior During Therapy WFL for tasks assessed/performed               Past Medical History:  Diagnosis Date   Anemia    Appendicitis    Arthritis    B12 nutritional deficiency    on supplement   GERD (gastroesophageal reflux disease)    Gout    Hepatitis C infection    not treated - pending appointment in april 2015   History of heart artery stent    Hypertension    Presbyopia    wears bifocals   Trouble in sleeping    Past Surgical History:  Procedure Laterality Date   APPENDECTOMY     CARDIAC CATHETERIZATION     7 YRS AGO W/STENT   CHOLECYSTECTOMY     Left shoulder surgery     REVERSE SHOULDER ARTHROPLASTY Left 09/25/2022   Procedure: LEFT REVERSE SHOULDER ARTHROPLASTY;  Surgeon: Oliver Barre, MD;  Location: [facility];  Service: Orthopedics;  Laterality: Left;  RNFA NEEDED   Right Hip Replacement Right 01/27/2020   Right Shoulder surgery     TOTAL KNEE ARTHROPLASTY Bilateral    Patient Active Problem List   Diagnosis Date Noted   Osteoarthritis of hips, 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   Arthritis of left glenohumeral joint 09/25/2022   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   Other abnormalities of gait and mobility 01/03/2021   Gout 01/03/2021   Posttraumatic stress disorder 01/03/2021   Gastroesophageal reflux disease 01/03/2021   Hyperlipidemia 01/03/2021   Vitamin B12 deficiency 01/03/2021   Shoulder pain 01/03/2021   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    Coronary atherosclerosis 01/03/2021   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   Chronic midline low back pain without sciatica 05/24/2018   Lumbar spondylosis 05/24/2018   Pain syndrome, chronic 06/15/2017   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/15/2017   Sprain of right wrist 10/31/2016   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   S/P ORIF (open reduction internal fixation) fracture 05/05/2016   Closed fracture of right distal radius 04/18/2016   Common cold 09/25/2013   CAD S/P percutaneous coronary angioplasty 09/25/2013   HCV (hepatitis C virus) 05/15/2013   Cholelithiasis 05/15/2013   Abdominal pain, epigastric 05/15/2013   Pancreatitis, acute 05/13/2013   Essential hypertension 05/13/2013   Transaminasemia 05/13/2013   Chest pain 05/12/2013    PCP: Jeanice Lim [place] REFERRING PROVIDER: Thane Edu, MD  ONSET DATE: 09/26/22  REFERRING DIAG: L reverse total shoulder replacement  THERAPY DIAG:  Acute pain of left shoulder  Stiffness of left shoulder, not elsewhere classified  Other symptoms and signs involving the musculoskeletal system  Rationale for Evaluation and Treatment: Rehabilitation  SUBJECTIVE:   SUBJECTIVE STATEMENT: "I'm hurting a lot today."  PERTINENT HISTORY: Pt has had severe arthritis for years. No other PMH noted in chart.   PRECAUTIONS: Shoulder  WEIGHT BEARING RESTRICTIONS: Yes >2lbs  PAIN:  Are you  having pain? Yes: NPRS scale: 8/10 Pain location: anterior shoulder girdel Pain description: aching Aggravating factors: movement Relieving factors: sleep  FALLS: Has patient fallen in last 6 months? Yes. Number of falls frequently  PLOF: Independent  PATIENT GOALS: To get back to where I was  NEXT MD VISIT: 02/01/23  OBJECTIVE:   HAND DOMINANCE: Right  ADLs: Overall ADLs: Mod assist for dressing and bathing, difficulty with cooking and cleaning due to weakness and protocol  FUNCTIONAL OUTCOME MEASURES: FOTO: 40.12 01/25/23:  62/100  UPPER EXTREMITY ROM:       Assessed in supine, er/IR adducted  Passive ROM Left eval  Shoulder flexion 118  Shoulder abduction 87  Shoulder internal rotation 90  Shoulder external rotation 27  (Blank rows = not tested)   Assessed in seated, er/IR adducted  Active ROM Left eval Left 12/21/22 Left 01/25/23  Shoulder flexion  121 136  Shoulder abduction  99 118  Shoulder internal rotation  90 90  Shoulder external rotation  43 50  (Blank rows = not tested)    UPPER EXTREMITY MMT:     Assessed in seating, er/IR adducted  MMT Left 12/21/22 Left 01/25/23  Shoulder flexion 4/5 4+/5  Shoulder abduction 4/5 4+/5  Shoulder internal rotation 4/5 4/5  Shoulder external rotation 3+/5 4-/5  (Blank rows = not tested)  OBSERVATIONS: Moderate to severe fascial restrictions    TODAY'S TREATMENT:                                                                                                                              DATE:   01/29/23 -P/ROM: supine-flexion, abduction, er, horizontal abduction, 10 reps -A/ROM: supine-protraction, flexion, horizontal abduction, er, abduction, 10 reps -AA/ROM: seated-protraction, flexion, abduction, horizontal abduction, er/IR, x10 -Pulleys: 1' flexion, 1' abduction -Scapular theraband: red band-row, extension, 10 reps -UBE: level 1, 2' forward, pace: 6.0  01/25/23 -Manual Therapy: myofascial release and trigger point applied to biceps, deltoid, trapezius, and scapular region in order to reduce fascial restrictions and pain, as well as improving ROM. -AA/ROM: supine, flexion, abduction, protraction, horizontal abduction, er/IR, x10 -A/ROM: supine, flexion, abduction, protraction, horizontal abduction, er/IR, x10 -Measurements and FOTO for Reassessment -scapular strengthening: green band, extension, retraction, rows, x10  01/17/23 -Manual Therapy: myofascial release and trigger point applied to biceps, deltoid, trapezius, and scapular  region in order to reduce fascial restrictions and pain, as well as improving ROM. -A/ROM: side lying, flexion, abduction, protraction,  horizontal abduction, er/IR, x10 -A/ROM: supine, flexion, abduction, protraction,  horizontal abduction, er/IR, x10 -Proximal shoulder strengthening: supine-paddles, criss cross, circles each direction, 10 reps each -Latissimus stretch: switching hand on top, 4x30"    PATIENT EDUCATION: Education details: Continue HEP Person educated: Patient Education method: Explanation, Demonstration, and Handouts Education comprehension: verbalized understanding and returned demonstration  HOME EXERCISE PROGRAM: 7/11: Tables slides and Pendulums 8/12: AA/ROM 8/19: Wall Slides 8/21: A/ROM 9/3: Isometrics 9/30: Scapular strengthening   GOALS: Goals reviewed with patient? Yes  SHORT TERM GOALS: Target date: 11/21/22  Pt will be provided with and educated on HEP to improve mobility in LUE required for use during ADL completion.   Goal status: IN PROGRESS  2.  Pt will increase :UE P/ROM by 50 degrees to improve ability to use LUE during dressing tasks with minimal compensatory techniques.   Goal status: IN PROGRESS  3.  Pt will increase LUE strength to 3+/5 to improve ability to reach for items at waist to chest height during bathing and grooming tasks.   Goal status: IN PROGRESS   LONG TERM GOALS: Target date: 12/22/22  Pt will decrease pain in LUE to 3/10 or less to improve ability to sleep for 2+ consecutive hours without waking due to pain.   Goal status: IN PROGRESS  2.  Pt will decrease LUE fascial restrictions to min amounts or less to improve mobility required for functional reaching tasks.   Goal status: IN PROGRESS  3.  Pt will increase LUE A/ROM by 50 degrees to improve ability to use LUE when reaching overhead or behind back during dressing and bathing tasks.   Goal status: IN PROGRESS  4.  Pt will increase LUE strength to 4+/5 or  greater to improve ability to use LLUE when lifting or carrying items during meal preparation/housework/yardwork tasks.   Goal status: IN PROGRESS  5.  Pt will return to highest level of function using LUE as non-dominant during functional task completion.   Goal status: IN PROGRESS   ASSESSMENT:  CLINICAL IMPRESSION: Pt reports he has a lot of pain today and feel like he is having a relapse. Pt late for session, states his scheduled said 2:45, OT asked him to bring his schedule to review at next session. Completed passive stretching and A/ROM in supine, pt only achieving 50% ROM for abduction due to pain today. Pt completing pulleys for passive stretch, added row and extension scapular strengthening. Verbal cuing for form and technique.   PERFORMANCE DEFICITS: in functional skills including in functional skills including ADLs, IADLs, coordination, tone, ROM, strength, pain, fascial restrictions, muscle spasms, and UE functional use.   PLAN:  OT FREQUENCY: 2x/week  OT DURATION: 8 weeks  PLANNED INTERVENTIONS: self care/ADL training, therapeutic exercise, therapeutic activity, neuromuscular re-education, manual therapy, passive range of motion, splinting, electrical stimulation, ultrasound, moist heat, cryotherapy, patient/family education, and DME and/or AE instructions  CONSULTED AND AGREED WITH PLAN OF CARE: Patient  PLAN FOR NEXT SESSION: Manual Therapy, A/ROM, follow protocol   Ezra Sites, OTR/L  [phone] 01/29/2023, 4:06 PM

## 2023-01-31 ENCOUNTER — Encounter (HOSPITAL_COMMUNITY): Payer: Self-pay | Admitting: Occupational Therapy

## 2023-01-31 ENCOUNTER — Encounter (HOSPITAL_COMMUNITY): Payer: 59 | Admitting: Occupational Therapy

## 2023-01-31 ENCOUNTER — Ambulatory Visit (HOSPITAL_COMMUNITY): Payer: 59 | Admitting: Occupational Therapy

## 2023-01-31 DIAGNOSIS — M25512 Pain in left shoulder: Secondary | ICD-10-CM

## 2023-01-31 DIAGNOSIS — M25612 Stiffness of left shoulder, not elsewhere classified: Secondary | ICD-10-CM

## 2023-01-31 DIAGNOSIS — R29898 Other symptoms and signs involving the musculoskeletal system: Secondary | ICD-10-CM

## 2023-01-31 NOTE — Therapy (Signed)
OUTPATIENT OCCUPATIONAL THERAPY ORTHO TREATMENT NOTE  Patient Name: Derrick Dean MRN: [id number] DOB:26-Jan-1941, 82 y.o., male Today's Date: 01/31/2023   END OF SESSION:  OT End of Session - 01/31/23 1436     Visit Number 17    Number of Visits 24    Date for OT Re-Evaluation 03/02/23    Authorization Type [employer] Dual Complete    OT Start Time 1347    OT Stop Time 1429    OT Time Calculation (min) 42 min    Activity Tolerance Patient tolerated treatment well    Behavior During Therapy WFL for tasks assessed/performed                Past Medical History:  Diagnosis Date   Anemia    Appendicitis    Arthritis    B12 nutritional deficiency    on supplement   GERD (gastroesophageal reflux disease)    Gout    Hepatitis C infection    not treated - pending appointment in april 2015   History of heart artery stent    Hypertension    Presbyopia    wears bifocals   Trouble in sleeping    Past Surgical History:  Procedure Laterality Date   APPENDECTOMY     CARDIAC CATHETERIZATION     7 YRS AGO W/STENT   CHOLECYSTECTOMY     Left shoulder surgery     REVERSE SHOULDER ARTHROPLASTY Left 09/25/2022   Procedure: LEFT REVERSE SHOULDER ARTHROPLASTY;  Surgeon: Oliver Barre, MD;  Location: [facility];  Service: Orthopedics;  Laterality: Left;  RNFA NEEDED   Right Hip Replacement Right 01/27/2020   Right Shoulder surgery     TOTAL KNEE ARTHROPLASTY Bilateral    Patient Active Problem List   Diagnosis Date Noted   Osteoarthritis of hips, 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   Arthritis of left glenohumeral joint 09/25/2022   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   Other abnormalities of gait and mobility 01/03/2021   Gout 01/03/2021   Posttraumatic stress disorder 01/03/2021   Gastroesophageal reflux disease 01/03/2021   Hyperlipidemia 01/03/2021   Vitamin B12 deficiency 01/03/2021   Shoulder pain 01/03/2021   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   Coronary  atherosclerosis 01/03/2021   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   Chronic midline low back pain without sciatica 05/24/2018   Lumbar spondylosis 05/24/2018   Pain syndrome, chronic 06/15/2017   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/15/2017   Sprain of right wrist 10/31/2016   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   S/P ORIF (open reduction internal fixation) fracture 05/05/2016   Closed fracture of right distal radius 04/18/2016   Common cold 09/25/2013   CAD S/P percutaneous coronary angioplasty 09/25/2013   HCV (hepatitis C virus) 05/15/2013   Cholelithiasis 05/15/2013   Abdominal pain, epigastric 05/15/2013   Pancreatitis, acute 05/13/2013   Essential hypertension 05/13/2013   Transaminasemia 05/13/2013   Chest pain 05/12/2013    PCP: Jeanice Lim [place] REFERRING PROVIDER: Thane Edu, MD  ONSET DATE: 09/26/22  REFERRING DIAG: L reverse total shoulder replacement  THERAPY DIAG:  Acute pain of left shoulder  Stiffness of left shoulder, not elsewhere classified  Other symptoms and signs involving the musculoskeletal system  Rationale for Evaluation and Treatment: Rehabilitation  SUBJECTIVE:   SUBJECTIVE STATEMENT: "I've been in pain so I haven't been able to do much."   PERTINENT HISTORY: Pt has had severe arthritis for years. No other PMH noted in chart.   PRECAUTIONS: Shoulder  WEIGHT BEARING RESTRICTIONS: Yes >2lbs  PAIN:  Are you having pain? Yes: NPRS scale: 7/10 Pain location: anterior shoulder girdel Pain description: aching Aggravating factors: movement Relieving factors: sleep  FALLS: Has patient fallen in last 6 months? Yes. Number of falls frequently  PLOF: Independent  PATIENT GOALS: To get back to where I was  NEXT MD VISIT: 02/01/23  OBJECTIVE:   HAND DOMINANCE: Right  ADLs: Overall ADLs: Mod assist for dressing and bathing, difficulty with cooking and cleaning due to weakness and protocol  FUNCTIONAL OUTCOME MEASURES: FOTO:  40.12 01/25/23: 62/100  UPPER EXTREMITY ROM:       Assessed in supine, er/IR adducted  Passive ROM Left eval Left 01/31/23  Shoulder flexion 118 135  Shoulder abduction 87 140  Shoulder internal rotation 90 90  Shoulder external rotation 27 65  (Blank rows = not tested)   Assessed in seated, er/IR adducted  Active ROM Left eval Left 12/21/22 Left 01/25/23 Left 01/31/23  Shoulder flexion  121 136 128  Shoulder abduction  99 118 115  Shoulder internal rotation  90 90 90  Shoulder external rotation  43 50 47  (Blank rows = not tested)    UPPER EXTREMITY MMT:     Assessed in seating, er/IR adducted  MMT Left 12/21/22 Left 01/25/23 Left 01/31/23  Shoulder flexion 4/5 4+/5 4+/5  Shoulder abduction 4/5 4+/5 4+/5  Shoulder internal rotation 4/5 4/5 4+/5  Shoulder external rotation 3+/5 4-/5 4-/5  (Blank rows = not tested)  OBSERVATIONS: Moderate to severe fascial restrictions    TODAY'S TREATMENT:                                                                                                                              DATE:   01/31/23 -P/ROM: supine-flexion, abduction, er, horizontal abduction, 5 reps -A/ROM: supine-protraction, flexion, horizontal abduction, er, abduction, 10 reps -Shoulder stretches: flexion, cross chest stretch, corner stretch, IR behind back with horizontal towel, 2x15" holds -A/ROM: standing-protraction, flexion, horizontal abduction, er, abduction, 10 reps -Functional reaching: pt placing 10 cones on top shelf of overhead cabinet in flexion, removing in abduction -Scapular theraband: red band-row, extension, 10 reps -Overhead lacing: pt seated, lacing from top down then reversing  01/29/23 -P/ROM: supine-flexion, abduction, er, horizontal abduction, 10 reps -A/ROM: supine-protraction, flexion, horizontal abduction, er, abduction, 10 reps -AA/ROM: seated-protraction, flexion, abduction, horizontal abduction, er/IR, x10 -Pulleys: 1' flexion,  1' abduction -Scapular theraband: red band-row, extension, 10 reps -UBE: level 1, 2' forward, pace: 6.0  01/25/23 -Manual Therapy: myofascial release and trigger point applied to biceps, deltoid, trapezius, and scapular region in order to reduce fascial restrictions and pain, as well as improving ROM. -AA/ROM: supine, flexion, abduction, protraction, horizontal abduction, er/IR, x10 -A/ROM: supine, flexion, abduction, protraction, horizontal abduction, er/IR, x10 -Measurements and FOTO for Reassessment -scapular strengthening: green band, extension, retraction, rows, x10     PATIENT EDUCATION: Education details: Continue HEP Person educated: Patient Education method: Explanation, Demonstration, and Handouts Education comprehension: verbalized understanding and  returned demonstration  HOME EXERCISE PROGRAM: 7/11: Tables slides and Pendulums 8/12: AA/ROM 8/19: Wall Slides 8/21: A/ROM 9/3: Isometrics 9/30: Scapular strengthening   GOALS: Goals reviewed with patient? Yes   SHORT TERM GOALS: Target date: 11/21/22  Pt will be provided with and educated on HEP to improve mobility in LUE required for use during ADL completion.   Goal status: IN PROGRESS  2.  Pt will increase :UE P/ROM by 50 degrees to improve ability to use LUE during dressing tasks with minimal compensatory techniques.   Goal status: IN PROGRESS  3.  Pt will increase LUE strength to 3+/5 to improve ability to reach for items at waist to chest height during bathing and grooming tasks.   Goal status: MET   LONG TERM GOALS: Target date: 12/22/22  Pt will decrease pain in LUE to 3/10 or less to improve ability to sleep for 2+ consecutive hours without waking due to pain.   Goal status: IN PROGRESS  2.  Pt will decrease LUE fascial restrictions to min amounts or less to improve mobility required for functional reaching tasks.   Goal status: IN PROGRESS  3.  Pt will increase LUE A/ROM by 50 degrees to improve  ability to use LUE when reaching overhead or behind back during dressing and bathing tasks.   Goal status: IN PROGRESS  4.  Pt will increase LUE strength to 4+/5 or greater to improve ability to use LLUE when lifting or carrying items during meal preparation/housework/yardwork tasks.   Goal status: IN PROGRESS  5.  Pt will return to highest level of function using LUE as non-dominant during functional task completion.   Goal status: IN PROGRESS   ASSESSMENT:  CLINICAL IMPRESSION: Pt reports he has not done his HEP in approximately 1 week due because he does not like to exercise when he is hurting. Measurements taken for MD appt tomorrow, pt with minimal improvements in ROM, discussed importance of completing HEP and expectations of soreness with exercises. Continued with A/ROM and resumed shoulder stretches today. Pt completing functional reaching today, reaching to top shelf of overhead cabinet with minimal difficulty. Added overhead lacing and continued scapular theraband with adding extension. Verbal cuing for form and technique.   PERFORMANCE DEFICITS: in functional skills including in functional skills including ADLs, IADLs, coordination, tone, ROM, strength, pain, fascial restrictions, muscle spasms, and UE functional use.   PLAN:  OT FREQUENCY: 2x/week  OT DURATION: 8 weeks  PLANNED INTERVENTIONS: self care/ADL training, therapeutic exercise, therapeutic activity, neuromuscular re-education, manual therapy, passive range of motion, splinting, electrical stimulation, ultrasound, moist heat, cryotherapy, patient/family education, and DME and/or AE instructions  CONSULTED AND AGREED WITH PLAN OF CARE: Patient  PLAN FOR NEXT SESSION: Manual Therapy, A/ROM, follow protocol   Ezra Sites, OTR/L  [phone] 01/31/2023, 2:37 PM

## 2023-02-09 ENCOUNTER — Encounter (HOSPITAL_COMMUNITY): Payer: 59 | Admitting: Occupational Therapy

## 2023-02-12 ENCOUNTER — Encounter (HOSPITAL_COMMUNITY): Payer: Self-pay | Admitting: Occupational Therapy

## 2023-02-12 ENCOUNTER — Ambulatory Visit (HOSPITAL_COMMUNITY): Payer: 59 | Admitting: Occupational Therapy

## 2023-02-12 DIAGNOSIS — R29898 Other symptoms and signs involving the musculoskeletal system: Secondary | ICD-10-CM

## 2023-02-12 DIAGNOSIS — M25512 Pain in left shoulder: Secondary | ICD-10-CM | POA: Diagnosis not present

## 2023-02-12 DIAGNOSIS — M25612 Stiffness of left shoulder, not elsewhere classified: Secondary | ICD-10-CM

## 2023-02-12 NOTE — Therapy (Signed)
OUTPATIENT OCCUPATIONAL THERAPY ORTHO TREATMENT NOTE  Patient Name: Derrick Dean MRN: [id number] DOB:[birth year], 82 y.o., male Today's Date: 02/12/2023   END OF SESSION:  OT End of Session - 02/12/23 1606     Visit Number 18    Number of Visits 24    Date for OT Re-Evaluation 03/02/23    Authorization Type [employer] Dual Complete    OT Start Time 1533    OT Stop Time 1602    OT Time Calculation (min) 29 min    Activity Tolerance Patient tolerated treatment well    Behavior During Therapy WFL for tasks assessed/performed               *pt arrived 18 mins late  Past Medical History:  Diagnosis Date   Anemia    Appendicitis    Arthritis    B12 nutritional deficiency    on supplement   GERD (gastroesophageal reflux disease)    Gout    Hepatitis C infection    not treated - pending appointment in april 2015   History of heart artery stent    Hypertension    Presbyopia    wears bifocals   Trouble in sleeping    Past Surgical History:  Procedure Laterality Date   APPENDECTOMY     CARDIAC CATHETERIZATION     7 YRS AGO W/STENT   CHOLECYSTECTOMY     Left shoulder surgery     REVERSE SHOULDER ARTHROPLASTY Left 09/25/2022   Procedure: LEFT REVERSE SHOULDER ARTHROPLASTY;  Surgeon: Oliver Barre, MD;  Location: [facility];  Service: Orthopedics;  Laterality: Left;  RNFA NEEDED   Right Hip Replacement Right 01/27/2020   Right Shoulder surgery     TOTAL KNEE ARTHROPLASTY Bilateral    Patient Active Problem List   Diagnosis Date Noted   Osteoarthritis of hips, 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   Arthritis of left glenohumeral joint 09/25/2022   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   Other abnormalities of gait and mobility 01/03/2021   Gout 01/03/2021   Posttraumatic stress disorder 01/03/2021   Gastroesophageal reflux disease 01/03/2021   Hyperlipidemia 01/03/2021   Vitamin B12 deficiency 01/03/2021   Shoulder pain 01/03/2021   Recurrent major depression (HCC)  01/03/2021   Coronary atherosclerosis 01/03/2021   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   Chronic midline low back pain without sciatica 05/24/2018   Lumbar spondylosis 05/24/2018   Pain syndrome, chronic 06/15/2017   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/15/2017   Sprain of right wrist 10/31/2016   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   S/P ORIF (open reduction internal fixation) fracture 05/05/2016   Closed fracture of right distal radius 04/18/2016   Common cold 09/25/2013   CAD S/P percutaneous coronary angioplasty 09/25/2013   HCV (hepatitis C virus) 05/15/2013   Cholelithiasis 05/15/2013   Abdominal pain, epigastric 05/15/2013   Pancreatitis, acute 05/13/2013   Essential hypertension 05/13/2013   Transaminasemia 05/13/2013   Chest pain 05/12/2013    PCP: Jeanice Lim [place] REFERRING PROVIDER: Thane Edu, MD  ONSET DATE: 09/26/22  REFERRING DIAG: L reverse total shoulder replacement  THERAPY DIAG:  Acute pain of left shoulder  Stiffness of left shoulder, not elsewhere classified  Other symptoms and signs involving the musculoskeletal system  Rationale for Evaluation and Treatment: Rehabilitation  SUBJECTIVE:   SUBJECTIVE STATEMENT: "I did some exercises this morning."   PERTINENT HISTORY: Pt has had severe arthritis for years. No other PMH noted in chart.   PRECAUTIONS: Shoulder  WEIGHT BEARING RESTRICTIONS: Yes >2lbs  PAIN:  Are you having pain? Yes: NPRS scale: 6/10 Pain location: anterior shoulder girdel Pain description: aching Aggravating factors: movement Relieving factors: sleep  FALLS: Has patient fallen in last 6 months? Yes. Number of falls frequently  PLOF: Independent  PATIENT GOALS: To get back to where I was  NEXT MD VISIT: 02/01/23  OBJECTIVE:   HAND DOMINANCE: Right  ADLs: Overall ADLs: Mod assist for dressing and bathing, difficulty with cooking and cleaning due to weakness and protocol  FUNCTIONAL OUTCOME  MEASURES: FOTO: 40.12 01/25/23: 62/100  UPPER EXTREMITY ROM:       Assessed in supine, er/IR adducted  Passive ROM Left eval Left 01/31/23  Shoulder flexion 118 135  Shoulder abduction 87 140  Shoulder internal rotation 90 90  Shoulder external rotation 27 65  (Blank rows = not tested)   Assessed in seated, er/IR adducted  Active ROM Left eval Left 12/21/22 Left 01/25/23 Left 01/31/23  Shoulder flexion  121 136 128  Shoulder abduction  99 118 115  Shoulder internal rotation  90 90 90  Shoulder external rotation  43 50 47  (Blank rows = not tested)    UPPER EXTREMITY MMT:     Assessed in seating, er/IR adducted  MMT Left 12/21/22 Left 01/25/23 Left 01/31/23  Shoulder flexion 4/5 4+/5 4+/5  Shoulder abduction 4/5 4+/5 4+/5  Shoulder internal rotation 4/5 4/5 4+/5  Shoulder external rotation 3+/5 4-/5 4-/5  (Blank rows = not tested)  OBSERVATIONS: Moderate to severe fascial restrictions    TODAY'S TREATMENT:                                                                                                                              DATE:   02/12/23 -A/ROM: seated, flexion, abduction, protraction, horizontal abduction, er/IR, x12 -Proximal strengthening: paddles, criss cross, circles both directions, x10 -Scapular Strengthening: green band, extension, retractions, rows, x15 -Shoulder Strengthening: green band, horizontal abduction, er, IR, flexion, abduction, x12  01/31/23 -P/ROM: supine-flexion, abduction, er, horizontal abduction, 5 reps -A/ROM: supine-protraction, flexion, horizontal abduction, er, abduction, 10 reps -Shoulder stretches: flexion, cross chest stretch, corner stretch, IR behind back with horizontal towel, 2x15" holds -A/ROM: standing-protraction, flexion, horizontal abduction, er, abduction, 10 reps -Functional reaching: pt placing 10 cones on top shelf of overhead cabinet in flexion, removing in abduction -Scapular theraband: red band-row,  extension, 10 reps -Overhead lacing: pt seated, lacing from top down then reversing  01/29/23 -P/ROM: supine-flexion, abduction, er, horizontal abduction, 10 reps -A/ROM: supine-protraction, flexion, horizontal abduction, er, abduction, 10 reps -AA/ROM: seated-protraction, flexion, abduction, horizontal abduction, er/IR, x10 -Pulleys: 1' flexion, 1' abduction -Scapular theraband: red band-row, extension, 10 reps -UBE: level 1, 2' forward, pace: 6.0   PATIENT EDUCATION: Education details: Continue HEP Person educated: Patient Education method: Explanation, Demonstration, and Handouts Education comprehension: verbalized understanding and returned demonstration  HOME EXERCISE PROGRAM: 7/11: Tables slides and Pendulums 8/12: AA/ROM 8/19: Wall Slides 8/21: A/ROM 9/3: Isometrics 9/30: Scapular strengthening  GOALS: Goals reviewed with patient? Yes   SHORT TERM GOALS: Target date: 11/21/22  Pt will be provided with and educated on HEP to improve mobility in LUE required for use during ADL completion.   Goal status: IN PROGRESS  2.  Pt will increase :UE P/ROM by 50 degrees to improve ability to use LUE during dressing tasks with minimal compensatory techniques.   Goal status: IN PROGRESS  3.  Pt will increase LUE strength to 3+/5 to improve ability to reach for items at waist to chest height during bathing and grooming tasks.   Goal status: MET   LONG TERM GOALS: Target date: 12/22/22  Pt will decrease pain in LUE to 3/10 or less to improve ability to sleep for 2+ consecutive hours without waking due to pain.   Goal status: IN PROGRESS  2.  Pt will decrease LUE fascial restrictions to min amounts or less to improve mobility required for functional reaching tasks.   Goal status: IN PROGRESS  3.  Pt will increase LUE A/ROM by 50 degrees to improve ability to use LUE when reaching overhead or behind back during dressing and bathing tasks.   Goal status: IN PROGRESS  4.   Pt will increase LUE strength to 4+/5 or greater to improve ability to use LLUE when lifting or carrying items during meal preparation/housework/yardwork tasks.   Goal status: IN PROGRESS  5.  Pt will return to highest level of function using LUE as non-dominant during functional task completion.   Goal status: IN PROGRESS   ASSESSMENT:  CLINICAL IMPRESSION: This session pt presented 18 mins late. Session focused on overall strengthening and improving mobility. He reports that resistance band exercises increase his pain, however he is able to complete them with no rest break. Pt did fatigue with UBE this session, stopping every min for a rest break. OT providing verbal and tactile cuing for positioning and technique.   PERFORMANCE DEFICITS: in functional skills including in functional skills including ADLs, IADLs, coordination, tone, ROM, strength, pain, fascial restrictions, muscle spasms, and UE functional use.   PLAN:  OT FREQUENCY: 2x/week  OT DURATION: 8 weeks  PLANNED INTERVENTIONS: self care/ADL training, therapeutic exercise, therapeutic activity, neuromuscular re-education, manual therapy, passive range of motion, splinting, electrical stimulation, ultrasound, moist heat, cryotherapy, patient/family education, and DME and/or AE instructions  CONSULTED AND AGREED WITH PLAN OF CARE: Patient  PLAN FOR NEXT SESSION: Manual Therapy, A/ROM, follow protocol   Trish Mage, OTR/L [phone] 02/12/2023, 4:07 PM

## 2023-02-12 NOTE — Patient Instructions (Signed)

## 2023-02-14 ENCOUNTER — Encounter (HOSPITAL_COMMUNITY): Payer: 59 | Admitting: Occupational Therapy

## 2023-02-19 ENCOUNTER — Ambulatory Visit (HOSPITAL_COMMUNITY): Payer: No Typology Code available for payment source | Attending: Orthopedic Surgery | Admitting: Occupational Therapy

## 2023-02-19 ENCOUNTER — Encounter (HOSPITAL_COMMUNITY): Payer: Self-pay | Admitting: Occupational Therapy

## 2023-02-19 DIAGNOSIS — M25612 Stiffness of left shoulder, not elsewhere classified: Secondary | ICD-10-CM | POA: Insufficient documentation

## 2023-02-19 DIAGNOSIS — R29898 Other symptoms and signs involving the musculoskeletal system: Secondary | ICD-10-CM | POA: Diagnosis not present

## 2023-02-19 DIAGNOSIS — M25512 Pain in left shoulder: Secondary | ICD-10-CM | POA: Diagnosis present

## 2023-02-19 NOTE — Therapy (Unsigned)
OUTPATIENT OCCUPATIONAL THERAPY ORTHO TREATMENT NOTE  Patient Name: Derrick Dean MRN: [id number] DOB:1940-08-01, 82 y.o., male Today's Date: 02/21/2023   END OF SESSION:    02/19/23 1556  OT Visits / Re-Eval  Visit Number 19  Number of Visits 24  Date for OT Re-Evaluation 03/02/23  Authorization  Authorization Type [employer] Dual Complete  OT Time Calculation  OT Start Time 1515  OT Stop Time 1556  OT Time Calculation (min) 41 min  End of Session  Activity Tolerance Patient tolerated treatment well  Behavior During Therapy WFL for tasks assessed/performed     Past Medical History:  Diagnosis Date   Anemia    Appendicitis    Arthritis    B12 nutritional deficiency    on supplement   GERD (gastroesophageal reflux disease)    Gout    Hepatitis C infection    not treated - pending appointment in april 2015   History of heart artery stent    Hypertension    Presbyopia    wears bifocals   Trouble in sleeping    Past Surgical History:  Procedure Laterality Date   APPENDECTOMY     CARDIAC CATHETERIZATION     7 YRS AGO W/STENT   CHOLECYSTECTOMY     Left shoulder surgery     REVERSE SHOULDER ARTHROPLASTY Left 09/25/2022   Procedure: LEFT REVERSE SHOULDER ARTHROPLASTY;  Surgeon: Oliver Barre, MD;  Location: [facility];  Service: Orthopedics;  Laterality: Left;  RNFA NEEDED   Right Hip Replacement Right 01/27/2020   Right Shoulder surgery     TOTAL KNEE ARTHROPLASTY Bilateral    Patient Active Problem List   Diagnosis Date Noted   Osteoarthritis of hips, 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   Arthritis of left glenohumeral joint 09/25/2022   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   Other abnormalities of gait and mobility 01/03/2021   Gout 01/03/2021   Posttraumatic stress disorder 01/03/2021   Gastroesophageal reflux disease 01/03/2021   Hyperlipidemia 01/03/2021   Vitamin B12 deficiency 01/03/2021   Shoulder pain 01/03/2021   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    Coronary atherosclerosis 01/03/2021   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   Chronic midline low back pain without sciatica 05/24/2018   Lumbar spondylosis 05/24/2018   Pain syndrome, chronic 06/15/2017   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/15/2017   Sprain of right wrist 10/31/2016   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   S/P ORIF (open reduction internal fixation) fracture 05/05/2016   Closed fracture of right distal radius 04/18/2016   Common cold 09/25/2013   CAD S/P percutaneous coronary angioplasty 09/25/2013   HCV (hepatitis C virus) 05/15/2013   Cholelithiasis 05/15/2013   Abdominal pain, epigastric 05/15/2013   Pancreatitis, acute 05/13/2013   Essential hypertension 05/13/2013   Transaminasemia 05/13/2013   Chest pain 05/12/2013    PCP: Jeanice Lim [place] REFERRING PROVIDER: Thane Edu, MD  ONSET DATE: 09/26/22  REFERRING DIAG: L reverse total shoulder replacement  THERAPY DIAG:  Acute pain of left shoulder  Stiffness of left shoulder, not elsewhere classified  Other symptoms and signs involving the musculoskeletal system  Rationale for Evaluation and Treatment: Rehabilitation  SUBJECTIVE:   SUBJECTIVE STATEMENT: "I did some exercises this morning."   PERTINENT HISTORY: Pt has had severe arthritis for years. No other PMH noted in chart.   PRECAUTIONS: Shoulder  WEIGHT BEARING RESTRICTIONS: Yes >2lbs  PAIN:  Are you having pain? Yes: NPRS scale: 6/10 Pain location: anterior shoulder girdel Pain description: aching Aggravating factors: movement Relieving factors:  sleep  FALLS: Has patient fallen in last 6 months? Yes. Number of falls frequently  PLOF: Independent  PATIENT GOALS: To get back to where I was  NEXT MD VISIT: 02/01/23  OBJECTIVE:   HAND DOMINANCE: Right  ADLs: Overall ADLs: Mod assist for dressing and bathing, difficulty with cooking and cleaning due to weakness and protocol  FUNCTIONAL OUTCOME MEASURES: FOTO:  40.12 01/25/23: 62/100  UPPER EXTREMITY ROM:       Assessed in supine, er/IR adducted  Passive ROM Left eval Left 01/31/23  Shoulder flexion 118 135  Shoulder abduction 87 140  Shoulder internal rotation 90 90  Shoulder external rotation 27 65  (Blank rows = not tested)   Assessed in seated, er/IR adducted  Active ROM Left eval Left 12/21/22 Left 01/25/23 Left 01/31/23  Shoulder flexion  121 136 128  Shoulder abduction  99 118 115  Shoulder internal rotation  90 90 90  Shoulder external rotation  43 50 47  (Blank rows = not tested)    UPPER EXTREMITY MMT:     Assessed in seating, er/IR adducted  MMT Left 12/21/22 Left 01/25/23 Left 01/31/23  Shoulder flexion 4/5 4+/5 4+/5  Shoulder abduction 4/5 4+/5 4+/5  Shoulder internal rotation 4/5 4/5 4+/5  Shoulder external rotation 3+/5 4-/5 4-/5  (Blank rows = not tested)  OBSERVATIONS: Moderate to severe fascial restrictions    TODAY'S TREATMENT:                                                                                                                              DATE:   02/19/23 -Manual Therapy: myofascial release and trigger point applied to biceps, deltoid, scapular region, and trapezius in order to reduce fascial restrictions and pain, to improve ROM.  -A/ROM: seated, flexion, abduction, protraction, horizontal abduction, er/IR, x15 -X to V arms, x15 -Goal Post arms, x15 -Shoulder Strengthening: 2#, horizontal abduction, er, IR, flexion, abduction, x12  02/12/23 -A/ROM: seated, flexion, abduction, protraction, horizontal abduction, er/IR, x12 -Proximal strengthening: paddles, criss cross, circles both directions, x10 -Scapular Strengthening: green band, extension, retractions, rows, x15 -Shoulder Strengthening: green band, horizontal abduction, er, IR, flexion, abduction, x12  01/31/23 -P/ROM: supine-flexion, abduction, er, horizontal abduction, 5 reps -A/ROM: supine-protraction, flexion, horizontal  abduction, er, abduction, 10 reps -Shoulder stretches: flexion, cross chest stretch, corner stretch, IR behind back with horizontal towel, 2x15" holds -A/ROM: standing-protraction, flexion, horizontal abduction, er, abduction, 10 reps -Functional reaching: pt placing 10 cones on top shelf of overhead cabinet in flexion, removing in abduction -Scapular theraband: red band-row, extension, 10 reps -Overhead lacing: pt seated, lacing    PATIENT EDUCATION: Education details: Continue HEP Person educated: Patient Education method: Explanation, Demonstration, and Handouts Education comprehension: verbalized understanding and returned demonstration  HOME EXERCISE PROGRAM: 7/11: Tables slides and Pendulums 8/12: AA/ROM 8/19: Wall Slides 8/21: A/ROM 9/3: Isometrics 9/30: Scapular strengthening   GOALS: Goals reviewed with patient? Yes   SHORT TERM GOALS: Target date: 11/21/22  Pt will be provided with and educated on HEP to improve mobility in LUE required for use during ADL completion.   Goal status: IN PROGRESS  2.  Pt will increase :UE P/ROM by 50 degrees to improve ability to use LUE during dressing tasks with minimal compensatory techniques.   Goal status: IN PROGRESS  3.  Pt will increase LUE strength to 3+/5 to improve ability to reach for items at waist to chest height during bathing and grooming tasks.   Goal status: MET   LONG TERM GOALS: Target date: 12/22/22  Pt will decrease pain in LUE to 3/10 or less to improve ability to sleep for 2+ consecutive hours without waking due to pain.   Goal status: IN PROGRESS  2.  Pt will decrease LUE fascial restrictions to min amounts or less to improve mobility required for functional reaching tasks.   Goal status: IN PROGRESS  3.  Pt will increase LUE A/ROM by 50 degrees to improve ability to use LUE when reaching overhead or behind back during dressing and bathing tasks.   Goal status: IN PROGRESS  4.  Pt will increase LUE  strength to 4+/5 or greater to improve ability to use LLUE when lifting or carrying items during meal preparation/housework/yardwork tasks.   Goal status: IN PROGRESS  5.  Pt will return to highest level of function using LUE as non-dominant during functional task completion.   Goal status: IN PROGRESS   ASSESSMENT:  CLINICAL IMPRESSION: This session pt continuing to demonstrate mildly limited ROM due to moderate to severe fascial restrictions along the biceps and scapular region. He reports that his pain is overall fair, however has times that it spikes up high and limits his ability to complete ADL's. OT providing continued education on importance of completing HEP at home to improve strength and mobility, as well as decreasing pain. Verbal and tactile cuing provided for positioning and technique throughout session.   PERFORMANCE DEFICITS: in functional skills including in functional skills including ADLs, IADLs, coordination, tone, ROM, strength, pain, fascial restrictions, muscle spasms, and UE functional use.   PLAN:  OT FREQUENCY: 2x/week  OT DURATION: 8 weeks  PLANNED INTERVENTIONS: self care/ADL training, therapeutic exercise, therapeutic activity, neuromuscular re-education, manual therapy, passive range of motion, splinting, electrical stimulation, ultrasound, moist heat, cryotherapy, patient/family education, and DME and/or AE instructions  CONSULTED AND AGREED WITH PLAN OF CARE: Patient  PLAN FOR NEXT SESSION: Manual Therapy, A/ROM, follow protocol   Trish Mage, OTR/L [phone] 02/21/2023, 11:32 AM

## 2023-02-21 ENCOUNTER — Encounter (HOSPITAL_COMMUNITY): Payer: Self-pay | Admitting: Occupational Therapy

## 2023-02-21 ENCOUNTER — Ambulatory Visit (HOSPITAL_COMMUNITY): Payer: No Typology Code available for payment source | Admitting: Occupational Therapy

## 2023-02-21 DIAGNOSIS — M25512 Pain in left shoulder: Secondary | ICD-10-CM | POA: Diagnosis not present

## 2023-02-21 DIAGNOSIS — M25612 Stiffness of left shoulder, not elsewhere classified: Secondary | ICD-10-CM

## 2023-02-21 DIAGNOSIS — R29898 Other symptoms and signs involving the musculoskeletal system: Secondary | ICD-10-CM

## 2023-02-21 NOTE — Therapy (Unsigned)
OUTPATIENT OCCUPATIONAL THERAPY ORTHO TREATMENT NOTE  Patient Name: Derrick Dean MRN: [id number] DOB:01-15-1941, 82 y.o., male Today's Date: 02/22/2023   END OF SESSION:  OT End of Session - 02/21/23 1431     Visit Number 20    Number of Visits 24    Date for OT Re-Evaluation 03/02/23    Authorization Type [employer] Dual Complete    OT Start Time 1351    OT Stop Time 1431    OT Time Calculation (min) 40 min    Activity Tolerance Patient tolerated treatment well    Behavior During Therapy WFL for tasks assessed/performed             Past Medical History:  Diagnosis Date   Anemia    Appendicitis    Arthritis    B12 nutritional deficiency    on supplement   GERD (gastroesophageal reflux disease)    Gout    Hepatitis C infection    not treated - pending appointment in april 2015   History of heart artery stent    Hypertension    Presbyopia    wears bifocals   Trouble in sleeping    Past Surgical History:  Procedure Laterality Date   APPENDECTOMY     CARDIAC CATHETERIZATION     7 YRS AGO W/STENT   CHOLECYSTECTOMY     Left shoulder surgery     REVERSE SHOULDER ARTHROPLASTY Left 09/25/2022   Procedure: LEFT REVERSE SHOULDER ARTHROPLASTY;  Surgeon: Oliver Barre, MD;  Location: [facility];  Service: Orthopedics;  Laterality: Left;  RNFA NEEDED   Right Hip Replacement Right 01/27/2020   Right Shoulder surgery     TOTAL KNEE ARTHROPLASTY Bilateral    Patient Active Problem List   Diagnosis Date Noted   Osteoarthritis of hips, 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   Arthritis of left glenohumeral joint 09/25/2022   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   Other abnormalities of gait and mobility 01/03/2021   Gout 01/03/2021   Posttraumatic stress disorder 01/03/2021   Gastroesophageal reflux disease 01/03/2021   Hyperlipidemia 01/03/2021   Vitamin B12 deficiency 01/03/2021   Shoulder pain 01/03/2021   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   Coronary atherosclerosis  01/03/2021   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   Chronic midline low back pain without sciatica 05/24/2018   Lumbar spondylosis 05/24/2018   Pain syndrome, chronic 06/15/2017   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/15/2017   Sprain of right wrist 10/31/2016   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   S/P ORIF (open reduction internal fixation) fracture 05/05/2016   Closed fracture of right distal radius 04/18/2016   Common cold 09/25/2013   CAD S/P percutaneous coronary angioplasty 09/25/2013   HCV (hepatitis C virus) 05/15/2013   Cholelithiasis 05/15/2013   Abdominal pain, epigastric 05/15/2013   Pancreatitis, acute 05/13/2013   Essential hypertension 05/13/2013   Transaminasemia 05/13/2013   Chest pain 05/12/2013    PCP: [facility] [place] REFERRING PROVIDER: Thane Edu, MD  ONSET DATE: 09/26/22  REFERRING DIAG: L reverse total shoulder replacement  THERAPY DIAG:  Acute pain of left shoulder  Other symptoms and signs involving the musculoskeletal system  Stiffness of left shoulder, not elsewhere classified  Rationale for Evaluation and Treatment: Rehabilitation  SUBJECTIVE:   SUBJECTIVE STATEMENT: "I don't use my arm when it hurts"   PERTINENT HISTORY: Pt has had severe arthritis for years. No other PMH noted in chart.   PRECAUTIONS: Shoulder  WEIGHT BEARING RESTRICTIONS: Yes >2lbs  PAIN:  Are you having pain?  Yes: NPRS scale: 6/10 Pain location: anterior shoulder girdel Pain description: aching Aggravating factors: movement Relieving factors: sleep  FALLS: Has patient fallen in last 6 months? Yes. Number of falls frequently  PLOF: Independent  PATIENT GOALS: To get back to where I was  NEXT MD VISIT: 02/01/23  OBJECTIVE:   HAND DOMINANCE: Right  ADLs: Overall ADLs: Mod assist for dressing and bathing, difficulty with cooking and cleaning due to weakness and protocol  FUNCTIONAL OUTCOME MEASURES: FOTO: 40.12 01/25/23: 62/100  UPPER  EXTREMITY ROM:       Assessed in supine, er/IR adducted  Passive ROM Left eval Left 01/31/23  Shoulder flexion 118 135  Shoulder abduction 87 140  Shoulder internal rotation 90 90  Shoulder external rotation 27 65  (Blank rows = not tested)   Assessed in seated, er/IR adducted  Active ROM Left eval Left 12/21/22 Left 01/25/23 Left 01/31/23  Shoulder flexion  121 136 128  Shoulder abduction  99 118 115  Shoulder internal rotation  90 90 90  Shoulder external rotation  43 50 47  (Blank rows = not tested)    UPPER EXTREMITY MMT:     Assessed in seating, er/IR adducted  MMT Left 12/21/22 Left 01/25/23 Left 01/31/23  Shoulder flexion 4/5 4+/5 4+/5  Shoulder abduction 4/5 4+/5 4+/5  Shoulder internal rotation 4/5 4/5 4+/5  Shoulder external rotation 3+/5 4-/5 4-/5  (Blank rows = not tested)  OBSERVATIONS: Moderate to severe fascial restrictions    TODAY'S TREATMENT:                                                                                                                              DATE:   02/21/23 -Manual Therapy: myofascial release and trigger point applied to biceps, deltoid, scapular region, and trapezius in order to reduce fascial restrictions and pain, to improve ROM.  -A/ROM: seated, flexion, abduction, protraction, horizontal abduction, er/IR, x15 -X to V arms, x15 -Goal Post arms, x15 -Wall Slides: flexion, abduction, x10 -UBE, level 1, 2.5' forwards and backwards  02/19/23 -Manual Therapy: myofascial release and trigger point applied to biceps, deltoid, scapular region, and trapezius in order to reduce fascial restrictions and pain, to improve ROM.  -A/ROM: seated, flexion, abduction, protraction, horizontal abduction, er/IR, x15 -X to V arms, x15 -Goal Post arms, x15 -Shoulder Strengthening: 2#, horizontal abduction, er, IR, flexion, abduction, x12  02/12/23 -A/ROM: seated, flexion, abduction, protraction, horizontal abduction, er/IR,  x12 -Proximal strengthening: paddles, criss cross, circles both directions, x10 -Scapular Strengthening: green band, extension, retractions, rows, x15 -Shoulder Strengthening: green band, horizontal abduction, er, IR, flexion, abduction, x12    PATIENT EDUCATION: Education details: Continue HEP Person educated: Patient Education method: Explanation, Demonstration, and Handouts Education comprehension: verbalized understanding and returned demonstration  HOME EXERCISE PROGRAM: 7/11: Tables slides and Pendulums 8/12: AA/ROM 8/19: Wall Slides 8/21: A/ROM 9/3: Isometrics 9/30: Scapular strengthening   GOALS: Goals reviewed with patient? Yes   SHORT TERM GOALS: Target  date: 11/21/22  Pt will be provided with and educated on HEP to improve mobility in LUE required for use during ADL completion.   Goal status: IN PROGRESS  2.  Pt will increase :UE P/ROM by 50 degrees to improve ability to use LUE during dressing tasks with minimal compensatory techniques.   Goal status: IN PROGRESS  3.  Pt will increase LUE strength to 3+/5 to improve ability to reach for items at waist to chest height during bathing and grooming tasks.   Goal status: MET   LONG TERM GOALS: Target date: 12/22/22  Pt will decrease pain in LUE to 3/10 or less to improve ability to sleep for 2+ consecutive hours without waking due to pain.   Goal status: IN PROGRESS  2.  Pt will decrease LUE fascial restrictions to min amounts or less to improve mobility required for functional reaching tasks.   Goal status: IN PROGRESS  3.  Pt will increase LUE A/ROM by 50 degrees to improve ability to use LUE when reaching overhead or behind back during dressing and bathing tasks.   Goal status: IN PROGRESS  4.  Pt will increase LUE strength to 4+/5 or greater to improve ability to use LLUE when lifting or carrying items during meal preparation/housework/yardwork tasks.   Goal status: IN PROGRESS  5.  Pt will return to  highest level of function using LUE as non-dominant during functional task completion.   Goal status: IN PROGRESS   ASSESSMENT:  CLINICAL IMPRESSION: Pt reporting increased pain this session during A/ROM, requiring increased time for breaks and self stretching. He required frequent cuing to stay on task and complete exercises this session due to focus on pain and fatigue. Pt continues to require significant education on why it is import to complete HEP to improve mobility and decrease pain. OT providing verbal and tactile cuing for positioning and technique throughout session.   PERFORMANCE DEFICITS: in functional skills including in functional skills including ADLs, IADLs, coordination, tone, ROM, strength, pain, fascial restrictions, muscle spasms, and UE functional use.   PLAN:  OT FREQUENCY: 2x/week  OT DURATION: 8 weeks  PLANNED INTERVENTIONS: self care/ADL training, therapeutic exercise, therapeutic activity, neuromuscular re-education, manual therapy, passive range of motion, splinting, electrical stimulation, ultrasound, moist heat, cryotherapy, patient/family education, and DME and/or AE instructions  CONSULTED AND AGREED WITH PLAN OF CARE: Patient  PLAN FOR NEXT SESSION: Manual Therapy, A/ROM, follow protocol   Trish Mage, OTR/L [phone] 02/22/2023, 9:21 PM

## 2023-03-01 ENCOUNTER — Emergency Department (HOSPITAL_COMMUNITY): Payer: 59

## 2023-03-01 ENCOUNTER — Encounter (HOSPITAL_COMMUNITY): Payer: 59 | Admitting: Occupational Therapy

## 2023-03-01 ENCOUNTER — Encounter (HOSPITAL_COMMUNITY): Payer: Self-pay

## 2023-03-01 ENCOUNTER — Other Ambulatory Visit: Payer: Self-pay

## 2023-03-01 ENCOUNTER — Emergency Department (HOSPITAL_COMMUNITY)
Admission: EM | Admit: 2023-03-01 | Discharge: 2023-03-02 | Disposition: A | Discharge status: Home / Self Care | Payer: 59 | Attending: Emergency Medicine | Admitting: Emergency Medicine

## 2023-03-01 DIAGNOSIS — I6523 Occlusion and stenosis of bilateral carotid arteries: Secondary | ICD-10-CM | POA: Diagnosis not present

## 2023-03-01 DIAGNOSIS — R519 Headache, unspecified: Secondary | ICD-10-CM | POA: Insufficient documentation

## 2023-03-01 DIAGNOSIS — I10 Essential (primary) hypertension: Secondary | ICD-10-CM | POA: Diagnosis not present

## 2023-03-01 DIAGNOSIS — S60511A Abrasion of right hand, initial encounter: Secondary | ICD-10-CM | POA: Insufficient documentation

## 2023-03-01 DIAGNOSIS — I2510 Atherosclerotic heart disease of native coronary artery without angina pectoris: Secondary | ICD-10-CM | POA: Insufficient documentation

## 2023-03-01 DIAGNOSIS — S80211A Abrasion, right knee, initial encounter: Secondary | ICD-10-CM | POA: Insufficient documentation

## 2023-03-01 DIAGNOSIS — J8410 Pulmonary fibrosis, unspecified: Secondary | ICD-10-CM | POA: Diagnosis not present

## 2023-03-01 DIAGNOSIS — J849 Interstitial pulmonary disease, unspecified: Secondary | ICD-10-CM | POA: Insufficient documentation

## 2023-03-01 DIAGNOSIS — T07XXXA Unspecified multiple injuries, initial encounter: Secondary | ICD-10-CM

## 2023-03-01 DIAGNOSIS — I700 Atherosclerosis of aorta: Secondary | ICD-10-CM | POA: Diagnosis not present

## 2023-03-01 DIAGNOSIS — J439 Emphysema, unspecified: Secondary | ICD-10-CM | POA: Insufficient documentation

## 2023-03-01 DIAGNOSIS — M542 Cervicalgia: Secondary | ICD-10-CM | POA: Insufficient documentation

## 2023-03-01 DIAGNOSIS — K5730 Diverticulosis of large intestine without perforation or abscess without bleeding: Secondary | ICD-10-CM | POA: Insufficient documentation

## 2023-03-01 DIAGNOSIS — Z79899 Other long term (current) drug therapy: Secondary | ICD-10-CM | POA: Diagnosis not present

## 2023-03-01 DIAGNOSIS — S80811A Abrasion, right lower leg, initial encounter: Secondary | ICD-10-CM | POA: Insufficient documentation

## 2023-03-01 DIAGNOSIS — T148XXA Other injury of unspecified body region, initial encounter: Secondary | ICD-10-CM | POA: Insufficient documentation

## 2023-03-01 DIAGNOSIS — R911 Solitary pulmonary nodule: Secondary | ICD-10-CM | POA: Diagnosis not present

## 2023-03-01 DIAGNOSIS — S6991XA Unspecified injury of right wrist, hand and finger(s), initial encounter: Secondary | ICD-10-CM | POA: Diagnosis present

## 2023-03-01 DIAGNOSIS — Y92410 Unspecified street and highway as the place of occurrence of the external cause: Secondary | ICD-10-CM | POA: Diagnosis not present

## 2023-03-01 LAB — URINALYSIS, ROUTINE W REFLEX MICROSCOPIC
Bilirubin Urine: NEGATIVE
Glucose, UA: NEGATIVE mg/dL
Hgb urine dipstick: NEGATIVE
Ketones, ur: NEGATIVE mg/dL
Leukocytes,Ua: NEGATIVE
Nitrite: NEGATIVE
Protein, ur: NEGATIVE mg/dL
Specific Gravity, Urine: 1.009 (ref 1.005–1.030)
pH: 7 (ref 5.0–8.0)

## 2023-03-01 LAB — COMPREHENSIVE METABOLIC PANEL
ALT: 13 U/L (ref 0–44)
AST: 26 U/L (ref 15–41)
Albumin: 4 g/dL (ref 3.5–5.0)
Alkaline Phosphatase: 81 U/L (ref 38–126)
Anion gap: 8 (ref 5–15)
BUN: 11 mg/dL (ref 8–23)
CO2: 24 mmol/L (ref 22–32)
Calcium: 9.4 mg/dL (ref 8.9–10.3)
Chloride: 107 mmol/L (ref 98–111)
Creatinine, Ser: 0.78 mg/dL (ref 0.61–1.24)
GFR, Estimated: >60 mL/min (ref >60)
Glucose, Bld: 97 mg/dL (ref 70–99)
Potassium: 3.7 mmol/L (ref 3.5–5.1)
Sodium: 139 mmol/L (ref 135–145)
Total Bilirubin: 0.9 mg/dL (ref <1.2)
Total Protein: 8 g/dL (ref 6.5–8.1)

## 2023-03-01 LAB — CBC
HCT: 39.7 % (ref 39.0–52.0)
Hemoglobin: 13.5 g/dL (ref 13.0–17.0)
MCH: 30.3 pg (ref 26.0–34.0)
MCHC: 34 g/dL (ref 30.0–36.0)
MCV: 89 fL (ref 80.0–100.0)
Platelets: 289 10*3/uL (ref 150–400)
RBC: 4.46 MIL/uL (ref 4.22–5.81)
RDW: 14.9 % (ref 11.5–15.5)
WBC: 5.4 10*3/uL (ref 4.0–10.5)
nRBC: 0 % (ref 0.0–0.2)

## 2023-03-01 LAB — ETHANOL: Alcohol, Ethyl (B): <10 mg/dL (ref <10)

## 2023-03-01 MED ORDER — IOHEXOL 300 MG/ML  SOLN
75.0000 mL | Freq: Once | INTRAMUSCULAR | Status: AC | PRN
Start: 2023-03-01 — End: 2023-03-01
  Administered 2023-03-01: 75 mL via INTRAVENOUS

## 2023-03-01 MED ORDER — ACETAMINOPHEN 500 MG PO TABS
1000.0000 mg | ORAL_TABLET | Freq: Once | ORAL | Status: AC
  Administered 2023-03-01: 1000 mg via ORAL
  Filled 2023-03-01: qty 2
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
## 2023-03-02 MED ORDER — NAPROXEN 250 MG PO TABS
500.0000 mg | ORAL_TABLET | Freq: Once | ORAL | Status: AC
  Administered 2023-03-02: 500 mg via ORAL
  Filled 2023-03-02: qty 2
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
## 2023-03-02 NOTE — ED Notes (Signed)
Pt in NAD at d/c from ED. A&O. Ambulatory. Respirations even & unlabored. Skin warm & dry. Pt verbalized understanding of d/c teaching including follow up care and reasons to return to the ED. No needs or questions expressed at d/c.

## 2023-03-05 ENCOUNTER — Encounter (HOSPITAL_COMMUNITY): Payer: 59 | Admitting: Occupational Therapy

## 2023-03-08 ENCOUNTER — Encounter (HOSPITAL_COMMUNITY): Payer: 59 | Admitting: Occupational Therapy

## 2023-03-09 ENCOUNTER — Other Ambulatory Visit: Payer: Self-pay

## 2023-03-09 ENCOUNTER — Encounter (HOSPITAL_COMMUNITY): Payer: Self-pay

## 2023-03-09 ENCOUNTER — Emergency Department (HOSPITAL_COMMUNITY)
Admission: EM | Admit: 2023-03-09 | Discharge: 2023-03-09 | Disposition: A | Discharge status: Home / Self Care | Payer: No Typology Code available for payment source | Attending: Emergency Medicine | Admitting: Emergency Medicine

## 2023-03-09 ENCOUNTER — Emergency Department (HOSPITAL_COMMUNITY): Payer: No Typology Code available for payment source

## 2023-03-09 DIAGNOSIS — M25511 Pain in right shoulder: Secondary | ICD-10-CM | POA: Diagnosis present

## 2023-03-09 DIAGNOSIS — Y92410 Unspecified street and highway as the place of occurrence of the external cause: Secondary | ICD-10-CM | POA: Diagnosis not present

## 2023-03-09 DIAGNOSIS — I10 Essential (primary) hypertension: Secondary | ICD-10-CM | POA: Insufficient documentation

## 2023-03-09 DIAGNOSIS — Z79899 Other long term (current) drug therapy: Secondary | ICD-10-CM | POA: Diagnosis not present

## 2023-03-09 MED ORDER — LIDOCAINE 5 % EX PTCH
1.0000 | MEDICATED_PATCH | CUTANEOUS | Status: DC
  Administered 2023-03-09: 1 via TRANSDERMAL
  Filled 2023-03-09: qty 1
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
## 2023-03-09 NOTE — ED Notes (Signed)
Noted that pt had pelvis XRAY and CT abd/pelvis  Order for RIGHT shoulder XRAY ordered for pt
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
## 2023-03-12 ENCOUNTER — Encounter (HOSPITAL_COMMUNITY): Payer: 59 | Admitting: Occupational Therapy

## 2023-03-14 ENCOUNTER — Encounter (HOSPITAL_COMMUNITY): Payer: 59 | Admitting: Occupational Therapy

## 2023-03-19 ENCOUNTER — Encounter (HOSPITAL_COMMUNITY): Payer: 59 | Admitting: Occupational Therapy

## 2023-03-20 ENCOUNTER — Ambulatory Visit (INDEPENDENT_AMBULATORY_CARE_PROVIDER_SITE_OTHER): Payer: No Typology Code available for payment source | Admitting: Orthopedic Surgery

## 2023-03-20 ENCOUNTER — Encounter: Payer: Self-pay | Admitting: Orthopedic Surgery

## 2023-03-20 ENCOUNTER — Other Ambulatory Visit (INDEPENDENT_AMBULATORY_CARE_PROVIDER_SITE_OTHER): Payer: No Typology Code available for payment source

## 2023-03-20 VITALS — BP 158/75 | HR 68 | Ht 71.0 in | Wt 169.0 lb

## 2023-03-20 DIAGNOSIS — Z96612 Presence of left artificial shoulder joint: Secondary | ICD-10-CM | POA: Diagnosis not present

## 2023-03-20 MED ORDER — PREDNISONE 10 MG (21) PO TBPK: ORAL_TABLET | ORAL | 0 refills | Status: DC

## 2023-03-20 NOTE — Progress Notes (Unsigned)
Orthopaedic Postop Note  Assessment: Derrick Dean is a 82 y.o. male s/p Left Reverse Shoulder Arthroplasty  DOS: 09/25/22  Plan: Derrick Dean is doing well.  His motion and strength is better.  He is working well with PT but is ready to start with a home exercise program.  Radiographs look good.  No issues with his surgical incisions.  I am pleased with his progress.  He states his understanding.  I would like see him back in 3 months.   Follow-up: No follow-ups on file.  XR at next visit: Left shoulder  Subjective:  Chief Complaint  Patient presents with   Routine Post Op    L shoulder DOS 09/25/22    History of Present Illness: Derrick Dean is a 82 y.o. male who presents following the above stated procedure.  Surgery was approximately 3 months ago.  He is getting better.  No issues with his surgical incision.  He is still working with PT.  No medications on a consistent basis.    Review of Systems: No fevers or chills No numbness or tingling No Chest Pain No shortness of breath   Objective: BP (!) 158/75   Pulse 68   Ht 5\' 11"  (1.803 m)   Wt 169 lb (76.7 kg)   BMI 23.57 kg/m   Physical Exam:  Alert and oriented.  No acute distress.  Anterior based shoulder incision is healing well.  No surrounding erythema or drainage.  Sensation is intact in the axillary nerve distribution.  2+ radial pulse.  Sensation intact throughout the left hand.  Active forward flexion to 140, abduction to 100.  Passive ER to 30.  Mild weakness on strength testing.   IMAGING: I personally ordered and reviewed the following images:  X-rays left shoulder obtained in clinic today.  He is compared to prior x-rays.  There is a well-positioned shoulder arthroplasty.  No evidence of subsidence.  No fractures.  No lucency around the prosthesis.  No Bony lesions.  Impression: Reverse shoulder arthroplasty in stable position, without evidence of hardware failure or subsidence   Oliver Barre, MD 03/20/2023 3:18 PM

## 2023-03-21 ENCOUNTER — Encounter: Payer: Self-pay | Admitting: Orthopedic Surgery

## 2023-03-21 ENCOUNTER — Encounter (HOSPITAL_COMMUNITY): Payer: 59 | Admitting: Occupational Therapy

## 2023-05-21 ENCOUNTER — Ambulatory Visit (HOSPITAL_COMMUNITY): Payer: 59 | Attending: Orthopedic Surgery | Admitting: Occupational Therapy

## 2023-05-21 ENCOUNTER — Encounter (HOSPITAL_COMMUNITY): Payer: Self-pay | Admitting: Occupational Therapy

## 2023-05-21 DIAGNOSIS — Z471 Aftercare following joint replacement surgery: Secondary | ICD-10-CM | POA: Insufficient documentation

## 2023-05-21 DIAGNOSIS — M542 Cervicalgia: Secondary | ICD-10-CM | POA: Insufficient documentation

## 2023-05-21 DIAGNOSIS — R29898 Other symptoms and signs involving the musculoskeletal system: Secondary | ICD-10-CM | POA: Diagnosis present

## 2023-05-21 DIAGNOSIS — M6281 Muscle weakness (generalized): Secondary | ICD-10-CM | POA: Diagnosis not present

## 2023-05-21 DIAGNOSIS — M5382 Other specified dorsopathies, cervical region: Secondary | ICD-10-CM | POA: Insufficient documentation

## 2023-05-21 DIAGNOSIS — M25512 Pain in left shoulder: Secondary | ICD-10-CM | POA: Diagnosis present

## 2023-05-21 DIAGNOSIS — Z96612 Presence of left artificial shoulder joint: Secondary | ICD-10-CM | POA: Diagnosis not present

## 2023-05-21 DIAGNOSIS — M25612 Stiffness of left shoulder, not elsewhere classified: Secondary | ICD-10-CM | POA: Insufficient documentation

## 2023-05-21 NOTE — Patient Instructions (Signed)
 1) SHOULDER: Flexion On Table   Place hands on towel placed on table, elbows straight. Lean forward with you upper body, pushing towel away from body.  ___ reps per set, ___ sets per day  2) Abduction (Passive)   With arm out to side, resting on towel placed on table with palm DOWN, keeping trunk away from table, lean to the side while pushing towel away from body.  Repeat ____ times. Do ____ sessions per day.  Copyright  VHI. All rights reserved.     3) Internal Rotation (Assistive)   Seated with elbow bent at right angle and held against side, slide arm on table surface in an inward arc keeping elbow anchored in place. Repeat ____ times. Do ____ sessions per day. Activity: Use this motion to brush crumbs off the table.  Copyright  VHI. All rights reserved.    Perform each exercise ____10-15____ reps. 2-3x days.   1) Protraction   Start by holding a wand or cane at chest height.  Next, slowly push the wand outwards in front of your body so that your elbows become fully straightened. Then, return to the original position.     2) Shoulder FLEXION   In the standing position, hold a wand/cane with both arms, palms down on both sides. Raise up the wand/cane allowing your unaffected arm to perform most of the effort. Your affected arm should be partially relaxed.      3) Internal/External ROTATION   In the standing position, hold a wand/cane with both hands keeping your elbows bent. Move your arms and wand/cane to one side.  Your affected arm should be partially relaxed while your unaffected arm performs most of the effort.       4) Shoulder ABDUCTION   While holding a wand/cane palm face up on the injured side and palm face down on the uninjured side, slowly raise up your injured arm to the side.        5) Horizontal Abduction/Adduction      Straight arms holding cane at shoulder height, bring cane to right, center, left. Repeat starting to  left.   Copyright  VHI. All rights reserved.
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
## 2023-05-22 ENCOUNTER — Other Ambulatory Visit (INDEPENDENT_AMBULATORY_CARE_PROVIDER_SITE_OTHER): Payer: 59

## 2023-05-22 ENCOUNTER — Encounter: Payer: Self-pay | Admitting: Orthopedic Surgery

## 2023-05-22 ENCOUNTER — Ambulatory Visit (INDEPENDENT_AMBULATORY_CARE_PROVIDER_SITE_OTHER): Payer: 59 | Admitting: Orthopedic Surgery

## 2023-05-22 DIAGNOSIS — Z96612 Presence of left artificial shoulder joint: Secondary | ICD-10-CM
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
## 2023-05-24 ENCOUNTER — Encounter (HOSPITAL_COMMUNITY): Payer: Self-pay | Admitting: Occupational Therapy

## 2023-05-24 ENCOUNTER — Ambulatory Visit (HOSPITAL_COMMUNITY): Payer: 59 | Admitting: Occupational Therapy

## 2023-05-24 DIAGNOSIS — M25612 Stiffness of left shoulder, not elsewhere classified: Secondary | ICD-10-CM

## 2023-05-24 DIAGNOSIS — M25512 Pain in left shoulder: Secondary | ICD-10-CM

## 2023-05-24 DIAGNOSIS — Z471 Aftercare following joint replacement surgery: Secondary | ICD-10-CM | POA: Diagnosis not present

## 2023-05-24 DIAGNOSIS — R29898 Other symptoms and signs involving the musculoskeletal system: Secondary | ICD-10-CM
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
## 2023-05-29 ENCOUNTER — Encounter (HOSPITAL_COMMUNITY): Payer: Self-pay | Admitting: Occupational Therapy

## 2023-05-29 ENCOUNTER — Ambulatory Visit (HOSPITAL_COMMUNITY): Payer: 59 | Admitting: Occupational Therapy

## 2023-05-29 DIAGNOSIS — R29898 Other symptoms and signs involving the musculoskeletal system: Secondary | ICD-10-CM

## 2023-05-29 DIAGNOSIS — M25512 Pain in left shoulder: Secondary | ICD-10-CM

## 2023-05-29 DIAGNOSIS — M25612 Stiffness of left shoulder, not elsewhere classified: Secondary | ICD-10-CM

## 2023-05-29 DIAGNOSIS — Z471 Aftercare following joint replacement surgery: Secondary | ICD-10-CM | POA: Diagnosis not present
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
## 2023-05-31 ENCOUNTER — Encounter (HOSPITAL_COMMUNITY): Payer: 59 | Admitting: Occupational Therapy

## 2023-06-04 ENCOUNTER — Encounter (HOSPITAL_COMMUNITY): Payer: Self-pay | Admitting: Occupational Therapy

## 2023-06-04 ENCOUNTER — Ambulatory Visit (HOSPITAL_COMMUNITY): Payer: 59 | Admitting: Occupational Therapy

## 2023-06-04 DIAGNOSIS — M25512 Pain in left shoulder: Secondary | ICD-10-CM

## 2023-06-04 DIAGNOSIS — M25612 Stiffness of left shoulder, not elsewhere classified: Secondary | ICD-10-CM

## 2023-06-04 DIAGNOSIS — Z471 Aftercare following joint replacement surgery: Secondary | ICD-10-CM | POA: Diagnosis not present

## 2023-06-04 DIAGNOSIS — R29898 Other symptoms and signs involving the musculoskeletal system: Secondary | ICD-10-CM
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
## 2023-06-06 ENCOUNTER — Encounter (HOSPITAL_COMMUNITY): Payer: 59 | Admitting: Occupational Therapy

## 2023-06-11 ENCOUNTER — Ambulatory Visit (HOSPITAL_COMMUNITY): Payer: 59 | Admitting: Occupational Therapy

## 2023-06-11 ENCOUNTER — Encounter (HOSPITAL_COMMUNITY): Payer: Self-pay

## 2023-06-11 ENCOUNTER — Encounter (HOSPITAL_COMMUNITY): Payer: Self-pay | Admitting: Occupational Therapy

## 2023-06-11 ENCOUNTER — Ambulatory Visit (HOSPITAL_COMMUNITY): Payer: 59

## 2023-06-11 ENCOUNTER — Other Ambulatory Visit: Payer: Self-pay

## 2023-06-11 DIAGNOSIS — M25612 Stiffness of left shoulder, not elsewhere classified: Secondary | ICD-10-CM

## 2023-06-11 DIAGNOSIS — M5382 Other specified dorsopathies, cervical region: Secondary | ICD-10-CM

## 2023-06-11 DIAGNOSIS — M542 Cervicalgia: Secondary | ICD-10-CM

## 2023-06-11 DIAGNOSIS — M6281 Muscle weakness (generalized): Secondary | ICD-10-CM

## 2023-06-11 DIAGNOSIS — R29898 Other symptoms and signs involving the musculoskeletal system: Secondary | ICD-10-CM

## 2023-06-11 DIAGNOSIS — Z471 Aftercare following joint replacement surgery: Secondary | ICD-10-CM | POA: Diagnosis not present

## 2023-06-11 DIAGNOSIS — M25512 Pain in left shoulder: Secondary | ICD-10-CM
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
## 2023-06-13 ENCOUNTER — Ambulatory Visit (HOSPITAL_COMMUNITY): Payer: 59 | Admitting: Occupational Therapy

## 2023-06-13 ENCOUNTER — Encounter (HOSPITAL_COMMUNITY): Payer: Self-pay | Admitting: Occupational Therapy

## 2023-06-13 DIAGNOSIS — M25612 Stiffness of left shoulder, not elsewhere classified: Secondary | ICD-10-CM

## 2023-06-13 DIAGNOSIS — R29898 Other symptoms and signs involving the musculoskeletal system: Secondary | ICD-10-CM

## 2023-06-13 DIAGNOSIS — Z471 Aftercare following joint replacement surgery: Secondary | ICD-10-CM | POA: Diagnosis not present

## 2023-06-13 DIAGNOSIS — M25512 Pain in left shoulder: Secondary | ICD-10-CM
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
## 2023-06-13 NOTE — Patient Instructions (Signed)

## 2023-06-18 ENCOUNTER — Encounter (HOSPITAL_COMMUNITY): Payer: 59 | Admitting: Physical Therapy

## 2023-06-18 ENCOUNTER — Encounter (HOSPITAL_COMMUNITY): Payer: 59 | Admitting: Occupational Therapy

## 2023-06-20 ENCOUNTER — Encounter (HOSPITAL_COMMUNITY): Payer: 59 | Admitting: Physical Therapy

## 2023-06-29 ENCOUNTER — Encounter (HOSPITAL_COMMUNITY): Payer: 59

## 2023-07-02 ENCOUNTER — Encounter (HOSPITAL_COMMUNITY): Payer: 59 | Admitting: Physical Therapy

## 2023-07-05 ENCOUNTER — Encounter (HOSPITAL_COMMUNITY): Payer: 59 | Admitting: Physical Therapy

## 2023-07-09 ENCOUNTER — Encounter (HOSPITAL_COMMUNITY): Payer: Self-pay

## 2023-07-09 ENCOUNTER — Ambulatory Visit (HOSPITAL_COMMUNITY): Attending: Orthopedic Surgery

## 2023-07-09 ENCOUNTER — Encounter (HOSPITAL_COMMUNITY): Payer: 59

## 2023-07-09 DIAGNOSIS — M25612 Stiffness of left shoulder, not elsewhere classified: Secondary | ICD-10-CM | POA: Insufficient documentation

## 2023-07-09 DIAGNOSIS — M25512 Pain in left shoulder: Secondary | ICD-10-CM | POA: Diagnosis present

## 2023-07-09 DIAGNOSIS — R29898 Other symptoms and signs involving the musculoskeletal system: Secondary | ICD-10-CM | POA: Insufficient documentation

## 2023-07-09 DIAGNOSIS — M542 Cervicalgia: Secondary | ICD-10-CM | POA: Insufficient documentation
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
## 2023-07-11 ENCOUNTER — Encounter (HOSPITAL_COMMUNITY): Payer: 59 | Admitting: Physical Therapy

## 2023-07-16 ENCOUNTER — Ambulatory Visit (HOSPITAL_COMMUNITY)

## 2023-07-16 DIAGNOSIS — M542 Cervicalgia: Secondary | ICD-10-CM

## 2023-07-16 DIAGNOSIS — M5382 Other specified dorsopathies, cervical region: Secondary | ICD-10-CM

## 2023-07-16 DIAGNOSIS — M25512 Pain in left shoulder: Secondary | ICD-10-CM | POA: Diagnosis not present
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
## 2023-07-19 ENCOUNTER — Encounter (HOSPITAL_COMMUNITY)

## 2023-07-23 ENCOUNTER — Ambulatory Visit (HOSPITAL_COMMUNITY): Attending: Orthopedic Surgery

## 2023-07-23 DIAGNOSIS — M542 Cervicalgia: Secondary | ICD-10-CM | POA: Insufficient documentation

## 2023-07-23 DIAGNOSIS — M6281 Muscle weakness (generalized): Secondary | ICD-10-CM | POA: Diagnosis present

## 2023-07-23 DIAGNOSIS — M5382 Other specified dorsopathies, cervical region: Secondary | ICD-10-CM | POA: Insufficient documentation
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
## 2023-07-25 ENCOUNTER — Encounter (HOSPITAL_COMMUNITY): Payer: Self-pay

## 2023-07-25 ENCOUNTER — Ambulatory Visit (HOSPITAL_COMMUNITY)

## 2023-07-25 DIAGNOSIS — M6281 Muscle weakness (generalized): Secondary | ICD-10-CM

## 2023-07-25 DIAGNOSIS — M5382 Other specified dorsopathies, cervical region: Secondary | ICD-10-CM

## 2023-07-25 DIAGNOSIS — M542 Cervicalgia: Secondary | ICD-10-CM | POA: Diagnosis not present
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
## 2023-07-30 ENCOUNTER — Encounter (HOSPITAL_COMMUNITY)

## 2023-08-01 ENCOUNTER — Encounter (HOSPITAL_COMMUNITY)

## 2023-08-02 ENCOUNTER — Telehealth (HOSPITAL_COMMUNITY): Payer: Self-pay

## 2023-08-02 NOTE — Telephone Encounter (Signed)
 Called patient regarding missed appointment on 08/01/23. Started to leave message about missed appointment and patient not having any future appointments on voicemail and sounded like someone answered phone, could hear talking in the background but no one would respond. Attempted to call back twice but phone line was busy. Will attempt to call again tomorrow regarding wanting to extend or discharge from PT services.   6:18 PM, 08/02/23 Marysue Sola, PT, DPT [facility] [place]

## 2023-08-06 ENCOUNTER — Ambulatory Visit (INDEPENDENT_AMBULATORY_CARE_PROVIDER_SITE_OTHER): Payer: Self-pay | Admitting: Family Medicine

## 2023-08-06 ENCOUNTER — Encounter: Payer: Self-pay | Admitting: Family Medicine

## 2023-08-06 VITALS — BP 127/69 | HR 69 | Ht 71.0 in | Wt 170.0 lb

## 2023-08-06 DIAGNOSIS — R7301 Impaired fasting glucose: Secondary | ICD-10-CM

## 2023-08-06 DIAGNOSIS — G4700 Insomnia, unspecified: Secondary | ICD-10-CM | POA: Diagnosis not present

## 2023-08-06 DIAGNOSIS — E559 Vitamin D deficiency, unspecified: Secondary | ICD-10-CM

## 2023-08-06 DIAGNOSIS — M542 Cervicalgia: Secondary | ICD-10-CM

## 2023-08-06 DIAGNOSIS — G894 Chronic pain syndrome: Secondary | ICD-10-CM | POA: Diagnosis not present

## 2023-08-06 DIAGNOSIS — M160 Bilateral primary osteoarthritis of hip: Secondary | ICD-10-CM

## 2023-08-06 DIAGNOSIS — E038 Other specified hypothyroidism: Secondary | ICD-10-CM

## 2023-08-06 DIAGNOSIS — E7849 Other hyperlipidemia: Secondary | ICD-10-CM

## 2023-08-06 DIAGNOSIS — G8929 Other chronic pain: Secondary | ICD-10-CM

## 2023-08-06 DIAGNOSIS — Z1159 Encounter for screening for other viral diseases: Secondary | ICD-10-CM

## 2023-08-06 DIAGNOSIS — M25512 Pain in left shoulder: Secondary | ICD-10-CM | POA: Diagnosis not present

## 2023-08-06 DIAGNOSIS — M47816 Spondylosis without myelopathy or radiculopathy, lumbar region: Secondary | ICD-10-CM

## 2023-08-06 DIAGNOSIS — M48061 Spinal stenosis, lumbar region without neurogenic claudication: Secondary | ICD-10-CM

## 2023-08-06 DIAGNOSIS — Z114 Encounter for screening for human immunodeficiency virus [HIV]: Secondary | ICD-10-CM

## 2023-08-06 MED ORDER — METHOCARBAMOL 750 MG PO TABS: 750.0000 mg | ORAL_TABLET | Freq: Four times a day (QID) | ORAL | 1 refills | Status: AC

## 2023-08-06 MED ORDER — TRAZODONE HCL 100 MG PO TABS: 100.0000 mg | ORAL_TABLET | Freq: Every day | ORAL | 1 refills | Status: AC

## 2023-08-06 NOTE — Progress Notes (Signed)
 New Patient Office Visit  Subjective:  Patient ID: Derrick Dean, male    DOB: 1940-11-26  Age: 83 y.o. MRN: [id number]  CC:  Chief Complaint  Patient presents with   Establish Care    Physical  Neck and hip pain  Request medication to help w/ sleep. What he has is not working .    HPI Derrick Dean is a 83 y.o. male with past medical history of  chronic pain syndrome and insomnia presents for establishing care.  For the details of today's visit, please refer to the assessment and plan.     Past Medical History:  Diagnosis Date   Anemia    Appendicitis    Arthritis    B12 nutritional deficiency    on supplement   GERD (gastroesophageal reflux disease)    Gout    Hepatitis C infection    not treated - pending appointment in april 2015   History of heart artery stent    Hypertension    Presbyopia    wears bifocals   Trouble in sleeping     Past Surgical History:  Procedure Laterality Date   APPENDECTOMY     CARDIAC CATHETERIZATION     7 YRS AGO W/STENT   CHOLECYSTECTOMY     Left shoulder surgery     REVERSE SHOULDER ARTHROPLASTY Left 09/25/2022   Procedure: LEFT REVERSE SHOULDER ARTHROPLASTY;  Surgeon: Tonita Frater, MD;  Location: [facility];  Service: Orthopedics;  Laterality: Left;  RNFA NEEDED   Right Hip Replacement Right 01/27/2020   Right Shoulder surgery     TOTAL KNEE ARTHROPLASTY Bilateral     Family History  Problem Relation Age of Onset   Coronary artery disease Mother    Thyroid disease Mother    Heart disease Mother    Coronary artery disease Father    Heart disease Father     Social History   Socioeconomic History   Marital status: Single    Spouse name: Not on file   Number of children: 6   Years of education: Not on file   Highest education level: Not on file  Occupational History   Occupation: diabled veteran   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  Tobacco Use   Smoking status: Former    Current packs/day: 0.00    Types:  Cigarettes    Quit date: 09/14/2001    Years since quitting: 21.9   Smokeless tobacco: Never  Vaping Use   Vaping status: Never Used  Substance and Sexual Activity   Alcohol use: Yes    Alcohol/week: 1.0 standard drink of alcohol    Types: 1 Cans of beer per week    Comment: 3x week   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   Sexual activity: Not Currently  Other Topics Concern   Not on file  Social History Narrative   Divorced 2 times, few daughters   Social Drivers of Corporate investment banker Strain: Not on file  Food Insecurity: Not on file  Transportation Needs: Not on file  Physical Activity: Not on file  Stress: Not on file  Social Connections: Unknown (08/29/2021)   Received from [facility]   Social Network    Social Network: Not on file  Intimate Partner Violence: Unknown (07/21/2021)   Received from [facility], [facility]   HITS    Physically Hurt: Not on file    Insult or Talk  Down To: Not on file    Threaten Physical Harm: Not on file    Scream or Curse: Not on file    ROS Review of Systems  Constitutional:  Negative for fatigue and fever.  Eyes:  Negative for visual disturbance.  Respiratory:  Negative for chest tightness and shortness of breath.   Cardiovascular:  Negative for chest pain and palpitations.  Musculoskeletal:        Neck pain and shoulder pain  Neurological:  Negative for dizziness and headaches.    Objective:   Today's Vitals: BP 127/69   Pulse 69   Ht 5\' 11"  (1.803 m)   Wt 170 lb 0.6 oz (77.1 kg)   SpO2 96%   BMI 23.72 kg/m   Physical Exam HENT:     Head: Normocephalic.     Right Ear: External ear normal.     Left Ear: External ear normal.     Nose: No congestion or rhinorrhea.     Mouth/Throat:     Mouth: Mucous membranes are moist.  Cardiovascular:     Rate and Rhythm: Regular rhythm.     Heart sounds: No murmur heard. Pulmonary:     Effort: No  respiratory distress.     Breath sounds: Normal breath sounds.  Musculoskeletal:     Comments: Limited ROM of the neck  Neurological:     Mental Status: He is alert.      Assessment & Plan:   Pain syndrome, chronic Assessment & Plan: Chronic Condition Pending urine drug screening prior to prescribing narcotics. Referral placed to pain management for ongoing management of the patient's chronic pain.   Orders: -     Ambulatory referral to Pain Clinic -     ToxASSURE Select 13 (MW), Urine  Insomnia, unspecified type Assessment & Plan: Chronic Condition Encouraged the patient to start taking trazodone  100 mg at bedtime. For managing insomnia non-pharmacologically, consider the following sleep hygiene practices: Establish a Bedtime Routine: Create a consistent routine before bed to signal to your body that it is time to wind down. Maintain a Regular Sleep-Wake Schedule: Go to bed and wake up at the same time every day, even on weekends. Avoid Electronics Before Bed: Limit the use of computers and other electronic devices at least one hour before bedtime to reduce blue light exposure. Limit Time in Bed: If unable to fall asleep within 15 minutes, get out of bed and engage in a relaxing activity before trying again. Manage Daily Stress: Incorporate stress-reducing activities into your daily routine to alleviate anxiety and tension. Avoid Late Exercise: Refrain from vigorous exercise close to bedtime, as it can interfere with sleep. Use Bed for Sleep and Intimacy Only: Reserve the bed for sleep and sexual activity to strengthen the association between your bed and rest. Remove Electronics from [employer] Area: Consider keeping electronic devices away from the bedroom to minimize distractions.   Orders: -     traZODone  HCl; Take 1 tablet (100 mg total) by mouth at bedtime.  Dispense: 60 tablet; Refill: 1  Chronic left shoulder pain Assessment & Plan: Referral placed to orthopedic  surgery in [place] for proximity of care.   Orders: -     Ambulatory referral to Occupational Therapy  Neck pain, bilateral Assessment & Plan: Chronic Condition with Limited Range of Motion (ROM) of the Neck: The patient reports limited ROM of the neck with no numbness or tingling. Referral placed to physical therapy for further evaluation and management. Encouraged the patient to  continue taking his muscle relaxer, apply heat and cold therapy to the affected area, and use topical creams such as over-the-counter Voltaren gel. Advised the patient to follow up if symptoms worsen or fail to improve.   Orders: -     Ambulatory referral to Physical Therapy  Vitamin D  deficiency -     VITAMIN D  25 Hydroxy (Vit-D Deficiency, Fractures)  IFG (impaired fasting glucose) -     Hemoglobin A1c  Need for hepatitis C screening test -     Hepatitis C antibody  Encounter for screening for HIV -     HIV Antibody (routine testing w rflx)  TSH (thyroid-stimulating hormone deficiency) -     TSH + free T4  Other hyperlipidemia -     Lipid panel -     CMP14+EGFR -     CBC with Differential/Platelet  Degenerative lumbar spinal stenosis -     Methocarbamol ; Take 1 tablet (750 mg total) by mouth 4 (four) times daily.  Dispense: 90 tablet; Refill: 1  Primary osteoarthritis of both hips  Lumbar spondylosis -     Methocarbamol ; Take 1 tablet (750 mg total) by mouth 4 (four) times daily.  Dispense: 90 tablet; Refill: 1  Note: This chart has been completed using Engineer, civil (consulting) software, and while attempts have been made to ensure accuracy, certain words and phrases may not be transcribed as intended.     Follow-up: Return in about 3 months (around 11/05/2023).   Galileah Piggee, FNP

## 2023-08-06 NOTE — Patient Instructions (Addendum)
 I appreciate the opportunity to provide care to you today!    Follow up:  3 months  Fasting Labs: please stop by the lab during the week to get your blood drawn (CBC, CMP, TSH, Lipid profile, HgA1c, Vit D)  Screening: HIV and Hep C  Schedule medicare annual wellness visit  Chronic Pain: Start taking Robaxin  (methocarbamol ) 750 mg, up to four times daily as needed for muscle-related pain. If excessive daytime drowsiness occurs, consider taking the medication primarily at bedtime. Avoid taking Robaxin  when engaging in activities that require mental alertness, such as driving or operating machinery, as it may cause drowsiness. After reviewing the urine drug screen, a prescription for Percocet has been sent to the pharmacy to take as needed for chronic pain. Referrals to physical therapy and occupational therapy have been placed for additional support with mobility, function, and pain management.  Insomnia: Start taking Trazodone  100 mg at bedtime to help with sleep. Recommended nonpharmacological interventions for insomnia, including: Maintaining a consistent sleep schedule, even on weekends Avoiding screens, caffeine, and heavy meals before bedtime Creating a relaxing bedtime routine Using the bed only for sleep (not for reading, watching TV, or phone use) Exercising regularly, but not too close to bedtime Ensuring the sleep environment is cool, dark, and quiet    Please stop by your local pharmacy and get your Tdap and Shingles vaccine  Referrals today-  Physical therapy, occupational therapy  Please continue to a heart-healthy diet and increase your physical activities. Try to exercise for at least five days a week.    It was a pleasure to see you and I look forward to continuing to work together on your health and well-being. Please do not hesitate to call the office if you need care or have questions about your care.  In case of emergency, please visit the Emergency  Department for urgent care, or contact our clinic at [phone] to schedule an appointment. We're here to help you!   Have a wonderful day and week. With Gratitude, Chanelle Hodsdon MSN, FNP-BC

## 2023-08-07 LAB — CBC WITH DIFFERENTIAL/PLATELET
Basophils Absolute: 0 10*3/uL (ref 0.0–0.2)
Basos: 1 %
EOS (ABSOLUTE): 0.1 10*3/uL (ref 0.0–0.4)
Eos: 2 %
Hematocrit: 38 % (ref 37.5–51.0)
Hemoglobin: 12.6 g/dL — ABNORMAL LOW (ref 13.0–17.7)
Immature Grans (Abs): 0 10*3/uL (ref 0.0–0.1)
Immature Granulocytes: 0 %
Lymphocytes Absolute: 1.9 10*3/uL (ref 0.7–3.1)
Lymphs: 38 %
MCH: 30.3 pg (ref 26.6–33.0)
MCHC: 33.2 g/dL (ref 31.5–35.7)
MCV: 91 fL (ref 79–97)
Monocytes Absolute: 0.7 10*3/uL (ref 0.1–0.9)
Monocytes: 13 %
Neutrophils Absolute: 2.3 10*3/uL (ref 1.4–7.0)
Neutrophils: 46 %
Platelets: 299 10*3/uL (ref 150–450)
RBC: 4.16 x10E6/uL (ref 4.14–5.80)
RDW: 14.5 % (ref 11.6–15.4)
WBC: 5 10*3/uL (ref 3.4–10.8)

## 2023-08-07 LAB — HEMOGLOBIN A1C
Est. average glucose Bld gHb Est-mCnc: 128 mg/dL
Hgb A1c MFr Bld: 6.1 % — ABNORMAL HIGH (ref 4.8–5.6)

## 2023-08-07 LAB — LIPID PANEL
Chol/HDL Ratio: 4 ratio (ref 0.0–5.0)
Cholesterol, Total: 170 mg/dL (ref 100–199)
HDL: 43 mg/dL (ref >39)
LDL Chol Calc (NIH): 103 mg/dL — ABNORMAL HIGH (ref 0–99)
Triglycerides: 137 mg/dL (ref 0–149)
VLDL Cholesterol Cal: 24 mg/dL (ref 5–40)

## 2023-08-07 LAB — VITAMIN D 25 HYDROXY (VIT D DEFICIENCY, FRACTURES): Vit D, 25-Hydroxy: 24.2 ng/mL — ABNORMAL LOW (ref 30.0–100.0)

## 2023-08-07 LAB — CMP14+EGFR
ALT: 12 IU/L (ref 0–44)
AST: 21 IU/L (ref 0–40)
Albumin: 4.4 g/dL (ref 3.7–4.7)
Alkaline Phosphatase: 109 IU/L (ref 44–121)
BUN/Creatinine Ratio: 13 (ref 10–24)
BUN: 14 mg/dL (ref 8–27)
Bilirubin Total: 0.6 mg/dL (ref 0.0–1.2)
CO2: 23 mmol/L (ref 20–29)
Calcium: 9.4 mg/dL (ref 8.6–10.2)
Chloride: 105 mmol/L (ref 96–106)
Creatinine, Ser: 1.1 mg/dL (ref 0.76–1.27)
Globulin, Total: 3.2 g/dL (ref 1.5–4.5)
Glucose: 82 mg/dL (ref 70–99)
Potassium: 4.5 mmol/L (ref 3.5–5.2)
Sodium: 141 mmol/L (ref 134–144)
Total Protein: 7.6 g/dL (ref 6.0–8.5)
eGFR: 67 mL/min/{1.73_m2} (ref >59)

## 2023-08-07 LAB — HIV ANTIBODY (ROUTINE TESTING W REFLEX): HIV Screen 4th Generation wRfx: NONREACTIVE

## 2023-08-07 LAB — TSH+FREE T4
Free T4: 1.08 ng/dL (ref 0.82–1.77)
TSH: 3.23 u[IU]/mL (ref 0.450–4.500)

## 2023-08-07 LAB — HEPATITIS C ANTIBODY: Hep C Virus Ab: REACTIVE — AB

## 2023-08-08 ENCOUNTER — Other Ambulatory Visit: Payer: Self-pay | Admitting: Family Medicine

## 2023-08-08 DIAGNOSIS — R768 Other specified abnormal immunological findings in serum: Secondary | ICD-10-CM

## 2023-08-08 NOTE — Progress Notes (Signed)
 Please inform the patient that his hemoglobin A1c is 6.1, indicating that he is prediabetic. I recommend decreasing his intake of high-sugar foods and beverages and increasing physical activity. Please also inform the patient that his hepatitis C antibody test was reactive, and follow-up testing has been ordered to determine whether he has an active HCV infection. I encouraged him to return for lab work. His vitamin D  levels are slightly low, and a prescription for a weekly vitamin D  supplement has been sent to his pharmacy.

## 2023-08-09 ENCOUNTER — Other Ambulatory Visit: Payer: Self-pay | Admitting: Family Medicine

## 2023-08-09 DIAGNOSIS — G8929 Other chronic pain: Secondary | ICD-10-CM

## 2023-08-09 LAB — TOXASSURE SELECT 13 (MW), URINE

## 2023-08-09 MED ORDER — OXYCODONE-ACETAMINOPHEN 5-325 MG PO TABS: 1.0000 | ORAL_TABLET | Freq: Four times a day (QID) | ORAL | 0 refills | Status: AC | PRN

## 2023-08-09 NOTE — Progress Notes (Signed)
 Please inform the patient that a prescription for Percocet 5-325 mg, to be taken every 6 hours as needed for pain relief, has been sent to his pharmacy.

## 2023-08-10 DIAGNOSIS — M542 Cervicalgia: Secondary | ICD-10-CM | POA: Insufficient documentation

## 2023-08-10 DIAGNOSIS — G8929 Other chronic pain: Secondary | ICD-10-CM | POA: Insufficient documentation

## 2023-08-10 DIAGNOSIS — G4700 Insomnia, unspecified: Secondary | ICD-10-CM | POA: Insufficient documentation

## 2023-08-10 NOTE — Assessment & Plan Note (Signed)
 Chronic Condition Pending urine drug screening prior to prescribing narcotics. Referral placed to pain management for ongoing management of the patient's chronic pain.

## 2023-08-10 NOTE — Assessment & Plan Note (Signed)
 Referral placed to orthopedic surgery in [place] for proximity of care.

## 2023-08-10 NOTE — Assessment & Plan Note (Signed)
 Chronic Condition Encouraged the patient to start taking trazodone  100 mg at bedtime. For managing insomnia non-pharmacologically, consider the following sleep hygiene practices: Establish a Bedtime Routine: Create a consistent routine before bed to signal to your body that it is time to wind down. Maintain a Regular Sleep-Wake Schedule: Go to bed and wake up at the same time every day, even on weekends. Avoid Electronics Before Bed: Limit the use of computers and other electronic devices at least one hour before bedtime to reduce blue light exposure. Limit Time in Bed: If unable to fall asleep within 15 minutes, get out of bed and engage in a relaxing activity before trying again. Manage Daily Stress: Incorporate stress-reducing activities into your daily routine to alleviate anxiety and tension. Avoid Late Exercise: Refrain from vigorous exercise close to bedtime, as it can interfere with sleep. Use Bed for Sleep and Intimacy Only: Reserve the bed for sleep and sexual activity to strengthen the association between your bed and rest. Remove Electronics from [employer] Area: Consider keeping electronic devices away from the bedroom to minimize distractions.

## 2023-08-10 NOTE — Assessment & Plan Note (Addendum)
 Chronic Condition with Limited Range of Motion (ROM) of the Neck: The patient reports limited ROM of the neck with no numbness or tingling. Referral placed to physical therapy for further evaluation and management. Encouraged the patient to continue taking his muscle relaxer, apply heat and cold therapy to the affected area, and use topical creams such as over-the-counter Voltaren gel. Advised the patient to follow up if symptoms worsen or fail to improve.

## 2023-08-14 ENCOUNTER — Ambulatory Visit: Payer: Self-pay

## 2023-08-14 NOTE — Telephone Encounter (Signed)
  Chief Complaint: swelling to R foot, worse in morning, no injuries Symptoms: swelling, minimal Frequency: months Pertinent Negatives: Patient denies fever, redness, pain, injury, SOB Disposition: [] ED /[] Urgent Care (no appt availability in office) / [x] Appointment(In office/virtual)/ []  [facility]/ [] Home Care/ [] Refused Recommended Disposition /[] [facility]/ []  Follow-up with PCP Additional Notes: Pt originally called for questions pertaining to his recent lab work, specifically he was questioning his A1C. Pt was educated on decreasing/eliminating sugar as well as increasing activity level. During this time pt admitted that he is having foot swelling in the morning, pt states that it is worse in R foot and that it is very minimal. Pt states that he can still get his shoes on without complications. Pt states that he has hx of gout. Pt scheduled with PCP.  Copied from CRM [id number]. Topic: Clinical - Lab/Test Results >> Aug 14, 2023 10:28 AM Everette C wrote: Reason for CRM: The patient would like to speak with a member of clinical staff about their A1C and blood sugar concerns   The patient would like to review their abs from 08/06/23 further when possible Reason for Disposition  Foot pain is a chronic symptom (recurrent or ongoing AND present > 4 weeks)  Answer Assessment - Initial Assessment Questions 1. ONSET: "When did the pain start?"      months 2. LOCATION: "Where is the pain located?"      R foot swelling 3. PAIN: "How bad is the pain?"    (Scale 1-10; or mild, moderate, severe)  - MILD (1-3): doesn't interfere with normal activities.   - MODERATE (4-7): interferes with normal activities (e.g., work or school) or awakens from sleep, limping.   - SEVERE (8-10): excruciating pain, unable to do any normal activities, unable to walk.      Swelling in the morning, still can get shoes on but still swollen 4. WORK OR EXERCISE: "Has there been any recent work or  exercise that involved this part of the body?"      Denies, does have gout 5. CAUSE: "What do you think is causing the foot pain?"     unsure 6. OTHER SYMPTOMS: "Do you have any other symptoms?" (e.g., leg pain, rash, fever, numbness)     denies  Protocols used: Foot Pain-A-AH

## 2023-08-16 ENCOUNTER — Ambulatory Visit (INDEPENDENT_AMBULATORY_CARE_PROVIDER_SITE_OTHER): Admitting: Family Medicine

## 2023-08-16 ENCOUNTER — Encounter: Payer: Self-pay | Admitting: Family Medicine

## 2023-08-16 VITALS — BP 109/57 | HR 47 | Resp 18 | Ht 70.5 in | Wt 172.0 lb

## 2023-08-16 DIAGNOSIS — M1000 Idiopathic gout, unspecified site: Secondary | ICD-10-CM | POA: Diagnosis not present

## 2023-08-16 DIAGNOSIS — R7303 Prediabetes: Secondary | ICD-10-CM | POA: Diagnosis not present

## 2023-08-16 MED ORDER — COLCHICINE 0.6 MG PO TABS: ORAL_TABLET | ORAL | 0 refills | Status: DC

## 2023-08-16 NOTE — Patient Instructions (Addendum)
 I appreciate the opportunity to provide care to you today!   Gout Flare-Up - Medication Instructions: Start taking colchicine  as follows: Day 1: Take 1.2 mg (2 tablets of 0.6 mg) initially, followed by 0.6 mg one hour later. Day 2 and onward: Take 0.6 mg once or twice daily as directed, until the flare resolves. Do not exceed 1.8 mg in a 24-hour period. Discontinue once symptoms resolve, and contact the office if no improvement within a few days or if side effects occur (e.g., diarrhea, nausea).  Gout and Diet: Foods to Avoid or Limit To reduce your risk of gout flares, avoid or limit foods that are high in purines, which can raise uric acid levels in the body.  Foods to Avoid: Red meats (beef, lamb, pork) Organ meats (liver, kidney, sweetbreads) Shellfish (shrimp, lobster, crab, scallops) Certain fish (sardines, anchovies, mackerel, herring, tuna) High-purine processed meats (bacon, hot dogs, sausages) Sugary drinks and foods (sodas, fruit juices, candy, baked goods) Alcohol, especially: Beer (contains high purine content) Liquor (vodka, whiskey, etc.) Excess wine (in moderation may be okay--discuss with your provider)  Foods to Limit: High-fat dairy products (whole milk, ice cream) Refined carbohydrates (white bread, white rice, pastries) Moderate-purine vegetables (e.g., spinach, asparagus, mushrooms) -- generally safe but limit if eating in excess  Gout-Friendly Foods: Low-fat dairy (milk, yogurt, cheese) Whole grains (brown rice, oats, whole wheat) Fruits and vegetables (especially cherries, which may help lower uric acid) Legumes (lentils, beans -- despite modest purine levels, they are not linked to gout flares) Plenty of water  (at least 64 oz/day to help flush uric acid)  Tip: Maintaining a healthy weight, staying active, and drinking plenty of water  are also key to managing  gout.  http://www.green.com/ https://my.https://www.smith.com/  Please stop by your local pharmacy and get your Tdap and Shingles vaccine  Referrals today-   Attached with your AVS, you will find valuable resources for self-education. I highly recommend dedicating some time to thoroughly examine them.   Please continue to a heart-healthy diet and increase your physical activities. Try to exercise for at least five days a week.    It was a pleasure to see you and I look forward to continuing to work together on your health and well-being. Please do not hesitate to call the office if you need care or have questions about your care.  In case of emergency, please visit the Emergency Department for urgent care, or contact our clinic at [phone] to schedule an appointment. We're here to help you!   Have a wonderful day and week. With Gratitude, Tyliah Schlereth MSN, FNP-BC

## 2023-08-16 NOTE — Progress Notes (Addendum)
 Established Patient Office Visit  Subjective:  Patient ID: Derrick Dean, male    DOB: 12-19-1940  Age: 83 y.o. MRN: [id number]  CC:  Chief Complaint  Patient presents with   Foot Swelling    Right swelling in rt foot for last few months. Believes it may be a gout flare up    Diabetes    Would like to discuss his prediabetic dx. States he is eating right so wants to know what else he needs to be doing     HPI Derrick Dean is a 84 y.o. male with past medical history of Gout presents with complains of swelling of his feet with pain. The patient reports drinking alcohol and eating high purines foods.  Past Medical History:  Diagnosis Date   Anemia    Appendicitis    Arthritis    B12 nutritional deficiency    on supplement   GERD (gastroesophageal reflux disease)    Gout    Hepatitis C infection    not treated - pending appointment in april 2015   History of heart artery stent    Hypertension    Presbyopia    wears bifocals   Trouble in sleeping     Past Surgical History:  Procedure Laterality Date   APPENDECTOMY     CARDIAC CATHETERIZATION     7 YRS AGO W/STENT   CHOLECYSTECTOMY     Left shoulder surgery     REVERSE SHOULDER ARTHROPLASTY Left 09/25/2022   Procedure: LEFT REVERSE SHOULDER ARTHROPLASTY;  Surgeon: Tonita Frater, MD;  Location: [facility];  Service: Orthopedics;  Laterality: Left;  RNFA NEEDED   Right Hip Replacement Right 01/27/2020   Right Shoulder surgery     TOTAL KNEE ARTHROPLASTY Bilateral     Family History  Problem Relation Age of Onset   Coronary artery disease Mother    Thyroid disease Mother    Heart disease Mother    Coronary artery disease Father    Heart disease Father     Social History   Socioeconomic History   Marital status: Single    Spouse name: Not on file   Number of children: 6   Years of education: Not on file   Highest education level: Not on file  Occupational History   Occupation: diabled veteran    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  Tobacco Use   Smoking status: Former    Current packs/day: 0.00    Types: Cigarettes    Quit date: 09/14/2001    Years since quitting: 21.9   Smokeless tobacco: Never  Vaping Use   Vaping status: Never Used  Substance and Sexual Activity   Alcohol use: Yes    Alcohol/week: 1.0 standard drink of alcohol    Types: 1 Cans of beer per week    Comment: 3x week   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   Sexual activity: Not Currently  Other Topics Concern   Not on file  Social History Narrative   Divorced 2 times, few daughters   Social Drivers of Corporate investment banker Strain: Not on file  Food Insecurity: Not on file  Transportation Needs: Not on file  Physical Activity: Not on file  Stress: Not on file  Social Connections: Unknown (08/29/2021)   Received from [facility], [facility]   Social Network    Social Network: Not on file  Intimate Partner Violence: Unknown (07/21/2021)   Received  from [facility], [facility]   HITS    Physically Hurt: Not on file    Insult or Talk Down To: Not on file    Threaten Physical Harm: Not on file    Scream or Curse: Not on file    Outpatient Medications Prior to Visit  Medication Sig Dispense Refill   amLODipine  (NORVASC ) 10 MG tablet Take 10 mg by mouth daily.     cholecalciferol (VITAMIN D ) 1000 UNITS tablet Take 1,000 Units by mouth daily.     Cholecalciferol 1.25 MG (50000 UT) TABS Take 25 mcg by mouth daily as needed (in the morning w/ breakfast.).     losartan  (COZAAR ) 50 MG tablet Take 50 mg by mouth daily.     methocarbamol  (ROBAXIN -750) 750 MG tablet Take 1 tablet (750 mg total) by mouth 4 (four) times daily. 90 tablet 1   traZODone  (DESYREL ) 100 MG tablet Take 1 tablet (100 mg total) by mouth at bedtime. 60 tablet 1   allopurinol  (ZYLOPRIM ) 100 MG tablet Take 3 tablets (300 mg total) by mouth daily. Do not start until after resolution  of active gout flare (Patient taking differently: Take 300 mg by mouth daily as needed (gout flare).) 30 tablet 0   docusate sodium  (COLACE) 100 MG capsule Take 1 capsule (100 mg total) by mouth 2 (two) times daily. Take daily while taking pain medication; hold for diarrhea (Patient not taking: Reported on 08/16/2023) 60 capsule 0   polyethylene glycol (MIRALAX ) 17 g packet Take 17 g by mouth daily as needed for severe constipation. (Patient not taking: Reported on 08/16/2023) 14 each 0   pravastatin  (PRAVACHOL ) 40 MG tablet Take 40 mg by mouth at bedtime. (Patient not taking: Reported on 08/06/2023)     predniSONE  (STERAPRED UNI-PAK 21 TAB) 10 MG (21) TBPK tablet 10 mg DS 12 as directed (Patient not taking: Reported on 08/16/2023) 48 tablet 0   colchicine  0.6 MG tablet Take 1 tablet (0.6 mg total) by mouth daily. Take 2 tablets initially (1.2 mg), then 1 tablet one hour later. Can repeat 1 tablet up to three times. (Patient not taking: Reported on 11/21/2022) 5 tablet 0   No facility-administered medications prior to visit.    Allergies  Allergen Reactions   Lisinopril     Made me feel funny    ROS Review of Systems  Constitutional:  Negative for fatigue and fever.  Eyes:  Negative for visual disturbance.  Respiratory:  Negative for chest tightness and shortness of breath.   Cardiovascular:  Negative for chest pain and palpitations.  Musculoskeletal:        Swelling and tenderness with palpation of the feet  Neurological:  Negative for dizziness and headaches.      Objective:    Physical Exam HENT:     Head: Normocephalic.     Right Ear: External ear normal.     Left Ear: External ear normal.     Nose: No congestion or rhinorrhea.     Mouth/Throat:     Mouth: Mucous membranes are moist.  Cardiovascular:     Rate and Rhythm: Regular rhythm.     Heart sounds: No murmur heard. Pulmonary:     Effort: No respiratory distress.     Breath sounds: Normal breath sounds.  Musculoskeletal:      Right lower leg: Edema present.  Neurological:     Mental Status: He is alert.     BP (!) 109/57   Pulse (!) 47   Resp  18   Ht 5' 10.5" (1.791 m)   Wt 172 lb (78 kg)   SpO2 93%   BMI 24.33 kg/m  Wt Readings from Last 3 Encounters:  08/16/23 172 lb (78 kg)  08/06/23 170 lb 0.6 oz (77.1 kg)  03/20/23 169 lb (76.7 kg)    Lab Results  Component Value Date   TSH 3.230 08/06/2023   Lab Results  Component Value Date   WBC 5.0 08/06/2023   HGB 12.6 (L) 08/06/2023   HCT 38.0 08/06/2023   MCV 91 08/06/2023   PLT 299 08/06/2023   Lab Results  Component Value Date   NA 141 08/06/2023   K 4.5 08/06/2023   CO2 23 08/06/2023   GLUCOSE 82 08/06/2023   BUN 14 08/06/2023   CREATININE 1.10 08/06/2023   BILITOT 0.6 08/06/2023   ALKPHOS 109 08/06/2023   AST 21 08/06/2023   ALT 12 08/06/2023   PROT 7.6 08/06/2023   ALBUMIN 4.4 08/06/2023   CALCIUM  9.4 08/06/2023   ANIONGAP 8 03/01/2023   EGFR 67 08/06/2023   Lab Results  Component Value Date   CHOL 170 08/06/2023   Lab Results  Component Value Date   HDL 43 08/06/2023   Lab Results  Component Value Date   LDLCALC 103 (H) 08/06/2023   Lab Results  Component Value Date   TRIG 137 08/06/2023   Lab Results  Component Value Date   CHOLHDL 4.0 08/06/2023   Lab Results  Component Value Date   HGBA1C 6.1 (H) 08/06/2023      Assessment & Plan:  Acute idiopathic gout, unspecified site Assessment & Plan: Will initiate therapy on colchicine  0.6 mg  Encouraged to follow up for a uric acid levels after symptoms resolution on 2 weeks Discussed Gout Flare-Up - Medication Instructions: Start taking colchicine  as follows: Day 1: Take 1.2 mg (2 tablets of 0.6 mg) initially, followed by 0.6 mg one hour later. Day 2 and onward: Take 0.6 mg once or twice daily as directed, until the flare resolves. Do not exceed 1.8 mg in a 24-hour period. Discontinue once symptoms resolve, and contact the office if no improvement  within a few days or if side effects occur (e.g., diarrhea, nausea).  Discussed Gout and Diet: Foods to Avoid or Limit To reduce your risk of gout flares, avoid or limit foods that are high in purines, which can raise uric acid levels in the body.  Discussed Foods to Avoid: Red meats (beef, lamb, pork) Organ meats (liver, kidney, sweetbreads) Shellfish (shrimp, lobster, crab, scallops) Certain fish (sardines, anchovies, mackerel, herring, tuna) High-purine processed meats (bacon, hot dogs, sausages) Sugary drinks and foods (sodas, fruit juices, candy, baked goods) Alcohol, especially: Beer (contains high purine content) Liquor (vodka, whiskey, etc.) Excess wine (in moderation may be okay--discuss with your provider)  Foods to Limit: High-fat dairy products (whole milk, ice cream) Refined carbohydrates (white bread, white rice, pastries) Moderate-purine vegetables (e.g., spinach, asparagus, mushrooms) -- generally safe but limit if eating in excess   Orders: -     Uric acid -     Colchicine ; Take 2 tablets initially (1.2 mg), followed by 0.6 mg after 1 hour. Day 2 and thereafter: take 0.6 mg once or twice daily until flare resolves  Dispense: 10 tablet; Refill: 0 -     Allopurinol ; Take 3 tablets (300 mg total) by mouth daily. Do not start until after resolution of active gout flare  Dispense: 90 tablet; Refill: 2  Prediabetes Assessment & Plan: Encouraged  alcohol cessation, increased physical activity, and continuation of lifestyle modifications, including reducing intake of high-sugar foods and beverages.    Note: This chart has been completed using Engineer, civil (consulting) software, and while attempts have been made to ensure accuracy, certain words and phrases may not be transcribed as intended.    Follow-up: No follow-ups on file.   Mekaila Tarnow, FNP

## 2023-08-18 DIAGNOSIS — R7303 Prediabetes: Secondary | ICD-10-CM | POA: Insufficient documentation

## 2023-08-18 MED ORDER — ALLOPURINOL 100 MG PO TABS: 300.0000 mg | ORAL_TABLET | Freq: Every day | ORAL | 2 refills | Status: AC

## 2023-08-18 NOTE — Assessment & Plan Note (Addendum)
 Will initiate therapy on colchicine  0.6 mg  Encouraged to follow up for a uric acid levels after symptoms resolution on 2 weeks Discussed Gout Flare-Up - Medication Instructions: Start taking colchicine  as follows: Day 1: Take 1.2 mg (2 tablets of 0.6 mg) initially, followed by 0.6 mg one hour later. Day 2 and onward: Take 0.6 mg once or twice daily as directed, until the flare resolves. Do not exceed 1.8 mg in a 24-hour period. Discontinue once symptoms resolve, and contact the office if no improvement within a few days or if side effects occur (e.g., diarrhea, nausea).  Discussed Gout and Diet: Foods to Avoid or Limit To reduce your risk of gout flares, avoid or limit foods that are high in purines, which can raise uric acid levels in the body.  Discussed Foods to Avoid: Red meats (beef, lamb, pork) Organ meats (liver, kidney, sweetbreads) Shellfish (shrimp, lobster, crab, scallops) Certain fish (sardines, anchovies, mackerel, herring, tuna) High-purine processed meats (bacon, hot dogs, sausages) Sugary drinks and foods (sodas, fruit juices, candy, baked goods) Alcohol, especially: Beer (contains high purine content) Liquor (vodka, whiskey, etc.) Excess wine (in moderation may be okay--discuss with your provider)  Foods to Limit: High-fat dairy products (whole milk, ice cream) Refined carbohydrates (white bread, white rice, pastries) Moderate-purine vegetables (e.g., spinach, asparagus, mushrooms) -- generally safe but limit if eating in excess

## 2023-08-18 NOTE — Assessment & Plan Note (Signed)
 Encouraged alcohol cessation, increased physical activity, and continuation of lifestyle modifications, including reducing intake of high-sugar foods and beverages.

## 2023-08-23 ENCOUNTER — Encounter (HOSPITAL_COMMUNITY): Payer: Self-pay

## 2023-08-23 ENCOUNTER — Ambulatory Visit (HOSPITAL_COMMUNITY): Attending: Orthopedic Surgery

## 2023-08-23 ENCOUNTER — Telehealth: Payer: Self-pay | Admitting: Family Medicine

## 2023-08-23 DIAGNOSIS — M542 Cervicalgia: Secondary | ICD-10-CM | POA: Diagnosis present

## 2023-08-23 DIAGNOSIS — M5382 Other specified dorsopathies, cervical region: Secondary | ICD-10-CM

## 2023-08-23 DIAGNOSIS — M6281 Muscle weakness (generalized): Secondary | ICD-10-CM
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
## 2023-08-23 NOTE — Telephone Encounter (Signed)
 Copied from CRM [id number]. Topic: Referral - Question >> Aug 23, 2023  3:46 PM DeAngela L wrote: Reason for CRM: [place] with [facility] calling to ask if Dr Zarwolo could combine Ot and PT for head and neck into 1 Physical therapy referral order, cause the patient is not consistent enough and it would make it easier for him to get treated, she also said she could fax over a request that just only need signing aand help get the new request  Ladavia with [facility] number [phone]

## 2023-08-23 NOTE — Telephone Encounter (Signed)
 Kindly fax request that only need signing

## 2023-08-23 NOTE — Addendum Note (Signed)
 Addended by: Marysue Sola E on: 08/23/2023 04:31 PM   Modules accepted: Orders

## 2023-08-27 ENCOUNTER — Ambulatory Visit (HOSPITAL_COMMUNITY)

## 2023-08-27 ENCOUNTER — Encounter (HOSPITAL_COMMUNITY): Payer: Self-pay

## 2023-08-27 DIAGNOSIS — M542 Cervicalgia: Secondary | ICD-10-CM

## 2023-08-27 DIAGNOSIS — M5382 Other specified dorsopathies, cervical region: Secondary | ICD-10-CM

## 2023-08-27 DIAGNOSIS — M6281 Muscle weakness (generalized): Secondary | ICD-10-CM

## 2023-08-27 DIAGNOSIS — M25612 Stiffness of left shoulder, not elsewhere classified: Secondary | ICD-10-CM
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
## 2023-08-28 ENCOUNTER — Encounter (HOSPITAL_COMMUNITY): Payer: Self-pay

## 2023-08-28 ENCOUNTER — Emergency Department (HOSPITAL_COMMUNITY)
Admission: EM | Admit: 2023-08-28 | Discharge: 2023-08-28 | Disposition: A | Discharge status: Home / Self Care | Attending: Emergency Medicine | Admitting: Emergency Medicine

## 2023-08-28 ENCOUNTER — Other Ambulatory Visit: Payer: Self-pay

## 2023-08-28 ENCOUNTER — Emergency Department (HOSPITAL_COMMUNITY)

## 2023-08-28 DIAGNOSIS — M79671 Pain in right foot: Secondary | ICD-10-CM | POA: Diagnosis not present

## 2023-08-28 DIAGNOSIS — M79672 Pain in left foot: Secondary | ICD-10-CM | POA: Diagnosis present

## 2023-08-28 LAB — CBC WITH DIFFERENTIAL/PLATELET
Abs Immature Granulocytes: 0 10*3/uL (ref 0.00–0.07)
Basophils Absolute: 0 10*3/uL (ref 0.0–0.1)
Basophils Relative: 1 %
Eosinophils Absolute: 0.1 10*3/uL (ref 0.0–0.5)
Eosinophils Relative: 4 %
HCT: 33 % — ABNORMAL LOW (ref 39.0–52.0)
Hemoglobin: 11.5 g/dL — ABNORMAL LOW (ref 13.0–17.0)
Immature Granulocytes: 0 %
Lymphocytes Relative: 39 %
Lymphs Abs: 1.3 10*3/uL (ref 0.7–4.0)
MCH: 31.3 pg (ref 26.0–34.0)
MCHC: 34.8 g/dL (ref 30.0–36.0)
MCV: 89.9 fL (ref 80.0–100.0)
Monocytes Absolute: 0.5 10*3/uL (ref 0.1–1.0)
Monocytes Relative: 16 %
Neutro Abs: 1.3 10*3/uL — ABNORMAL LOW (ref 1.7–7.7)
Neutrophils Relative %: 40 %
Platelets: 232 10*3/uL (ref 150–400)
RBC: 3.67 MIL/uL — ABNORMAL LOW (ref 4.22–5.81)
RDW: 14.4 % (ref 11.5–15.5)
WBC: 3.2 10*3/uL — ABNORMAL LOW (ref 4.0–10.5)
nRBC: 0 % (ref 0.0–0.2)

## 2023-08-28 LAB — I-STAT CHEM 8, ED
BUN: 16 mg/dL (ref 8–23)
Calcium, Ion: 1.2 mmol/L (ref 1.15–1.40)
Chloride: 106 mmol/L (ref 98–111)
Creatinine, Ser: 1 mg/dL (ref 0.61–1.24)
Glucose, Bld: 88 mg/dL (ref 70–99)
HCT: 34 % — ABNORMAL LOW (ref 39.0–52.0)
Hemoglobin: 11.6 g/dL — ABNORMAL LOW (ref 13.0–17.0)
Potassium: 3.8 mmol/L (ref 3.5–5.1)
Sodium: 140 mmol/L (ref 135–145)
TCO2: 23 mmol/L (ref 22–32)

## 2023-08-28 MED ORDER — MELOXICAM 7.5 MG PO TABS: 7.5000 mg | ORAL_TABLET | Freq: Every day | ORAL | 0 refills | Status: AC

## 2023-08-28 NOTE — Discharge Instructions (Signed)
 Take the medication as directed.  Try to elevate your feet when possible.  Please follow-up with your primary care provider for recheck.

## 2023-08-28 NOTE — ED Triage Notes (Signed)
 Pt arrived via POV c/o gout flare up in both feet X 3 days. Pt reports taking Allopurinol  w/o relief.

## 2023-08-30 NOTE — ED Provider Notes (Signed)
 [place] EMERGENCY DEPARTMENT AT [facility] Provider Note   CSN: [id number] Arrival date & time: 08/28/23  1159     History  Chief Complaint  Patient presents with   Gout    Derrick Dean is a 83 y.o. male.  HPI      Derrick Dean is a 83 y.o. male who presents to the Emergency Department complaining of bilateral foot pain with left greater than right.  Symptoms are recurrent, worse x 3 days.  Denies known injury.  Believes that his symptoms are secondary to a gout flare.  He has been taking allopurinol  without relief.  He has taken colchicine  in the past but is currently out of this medication.  He denies known injury, numbness or tingling of his feet, lower leg pain or swelling of his calf.  No chest pain or shortness of breath.  No history of DVTs.   Home Medications Prior to Admission medications   Medication Sig Start Date End Date Taking? Authorizing Provider  meloxicam (MOBIC) 7.5 MG tablet Take 1 tablet (7.5 mg total) by mouth daily. Take with food 08/28/23  Yes Maeghan Canny, PA-C  allopurinol  (ZYLOPRIM ) 100 MG tablet Take 3 tablets (300 mg total) by mouth daily. Do not start until after resolution of active gout flare 08/18/23   Zarwolo, Gloria, FNP  amLODipine  (NORVASC ) 10 MG tablet Take 10 mg by mouth daily.    [provider]  cholecalciferol (VITAMIN D ) 1000 UNITS tablet Take 1,000 Units by mouth daily.    [provider]  Cholecalciferol 1.25 MG (50000 UT) TABS Take 25 mcg by mouth daily as needed (in the morning w/ breakfast.). 02/14/23   [provider]  docusate sodium  (COLACE) 100 MG capsule Take 1 capsule (100 mg total) by mouth 2 (two) times daily. Take daily while taking pain medication; hold for diarrhea Patient not taking: Reported on 08/16/2023 10/11/22   Tonita Frater, MD  losartan  (COZAAR ) 50 MG tablet Take 50 mg by mouth daily.    [provider]  methocarbamol  (ROBAXIN -750) 750 MG tablet Take 1  tablet (750 mg total) by mouth 4 (four) times daily. 08/06/23   Zarwolo, Gloria, FNP  polyethylene glycol (MIRALAX ) 17 g packet Take 17 g by mouth daily as needed for severe constipation. Patient not taking: Reported on 08/16/2023 10/11/22   Tonita Frater, MD  pravastatin  (PRAVACHOL ) 40 MG tablet Take 40 mg by mouth at bedtime. Patient not taking: Reported on 08/06/2023    [provider]  predniSONE  (STERAPRED UNI-PAK 21 TAB) 10 MG (21) TBPK tablet 10 mg DS 12 as directed Patient not taking: Reported on 08/16/2023 03/20/23   Tonita Frater, MD  traZODone  (DESYREL ) 100 MG tablet Take 1 tablet (100 mg total) by mouth at bedtime. 08/06/23   Zarwolo, Gloria, FNP      Allergies    Lisinopril    Review of Systems   Review of Systems  Constitutional:  Negative for appetite change and fever.  Respiratory:  Negative for shortness of breath.   Cardiovascular:  Negative for chest pain.  Gastrointestinal:  Negative for abdominal pain, nausea and vomiting.  Musculoskeletal:  Positive for arthralgias (Bilateral foot pain).  Skin:  Negative for color change and rash.  Neurological:  Negative for weakness and numbness.    Physical Exam Updated Vital Signs BP 130/80   Pulse 70   Temp 97.8 F (36.6 C) (Temporal)   Resp 18   Ht 5' 10.5" (1.791 m)  Wt 78 kg   SpO2 100%   BMI 24.32 kg/m  Physical Exam Vitals and nursing note reviewed.  Constitutional:      General: He is not in acute distress.    Appearance: Normal appearance. He is not ill-appearing or toxic-appearing.  Cardiovascular:     Rate and Rhythm: Normal rate and regular rhythm.     Pulses: Normal pulses.  Pulmonary:     Effort: Pulmonary effort is normal.  Chest:     Chest wall: No tenderness.  Musculoskeletal:        General: Tenderness present. No signs of injury.     Right lower leg: No edema.     Left lower leg: No edema.     Right foot: Normal capillary refill. Tenderness present. No deformity or Charcot foot. Normal  pulse.     Left foot: Normal capillary refill. Tenderness present. No swelling, deformity or Charcot foot. Normal pulse.     Comments: Tender along the lateral aspect of the bilateral feet, left greater than right.  Mild edema noted to the dorsal aspect of the right foot.  Dorsalis pedis pulses are strong and palpable bilaterally.  No bony deformities on exam.  No calf pain or swelling  Skin:    General: Skin is warm.     Capillary Refill: Capillary refill takes less than 2 seconds.     Findings: No bruising, erythema or rash.  Neurological:     General: No focal deficit present.     Mental Status: He is alert.     Sensory: No sensory deficit.     Motor: No weakness.     ED Results / Procedures / Treatments   Labs (all labs ordered are listed, but only abnormal results are displayed) Labs Reviewed  CBC WITH DIFFERENTIAL/PLATELET - Abnormal; Notable for the following components:      Result Value   WBC 3.2 (*)    RBC 3.67 (*)    Hemoglobin 11.5 (*)    HCT 33.0 (*)    Neutro Abs 1.3 (*)    All other components within normal limits  I-STAT CHEM 8, ED - Abnormal; Notable for the following components:   Hemoglobin 11.6 (*)    HCT 34.0 (*)    All other components within normal limits    EKG None  Radiology DG Foot Complete Left Result Date: 08/28/2023 CLINICAL DATA:  Pain and swelling.  Gout flare up.  3 days. EXAM: LEFT FOOT - COMPLETE 3+ VIEW COMPARISON:  Left foot radiographs 03/10/2022 FINDINGS: There is diffuse decreased bone mineralization. Mild-to-moderate plantar calcaneal heel spur. Minimal talonavicular and tarsometatarsal joint space narrowing and subchondral sclerosis diffusely. No cortical erosion is seen.  No acute fracture dislocation. IMPRESSION: 1. Mild-to-moderate plantar calcaneal heel spur. 2. Minimal talonavicular and tarsometatarsal osteoarthritis. 3. No radiographic evidence of gout. Electronically Signed   By: Bertina Broccoli M.D.   On: 08/28/2023 14:23   DG  Foot Complete Right Result Date: 08/28/2023 CLINICAL DATA:  Gout flare up.  Bilateral feet.  3 days. EXAM: RIGHT FOOT COMPLETE - 3+ VIEW COMPARISON:  Right foot radiographs 02/05/2021 FINDINGS: There is diffuse decreased bone mineralization. There is an oval lucency measuring up to 8 mm at the far dorsal medial aspect of the great toe metatarsal head, consistent with a punched out lytic erosion with thin sclerotic border and not significantly changed from prior. Minimal great toe metatarsophalangeal joint space narrowing and peripheral spurring. Mild second tarsometatarsal joint space narrowing and subchondral sclerosis. Chronic  enthesopathic spur measuring up to 12 mm in length at the Achilles tendon insertion on the calcaneus. Tiny plantar calcaneal heel spur. Mild talonavicular joint space narrowing. No acute fracture or dislocation. IMPRESSION: 1. Unchanged punched out lytic erosion at the far dorsal medial aspect of the great toe metatarsal head. This is compatible with the patient's reported history of gout. No calcified gout tophi. 2. Mild great toe metatarsophalangeal and second tarsometatarsal osteoarthritis. 3. Chronic enthesopathic spur at the Achilles tendon insertion on the calcaneus. Electronically Signed   By: Bertina Broccoli M.D.   On: 08/28/2023 14:21     Procedures Procedures    Medications Ordered in ED Medications - No data to display  ED Course/ Medical Decision Making/ A&P                                 Medical Decision Making Patient with history of gout, takes allopurinol  daily, ran out of his colchicine  recently.  Denies any known injury.  He has bilateral lateral foot pain.  No tenderness at the metatarsal heads, no erythema open wounds or lymphangitis.  Extremities are neurovascularly intact.  There is no calf pain or swelling.  His feet are warm and pink without evidence of cellulitis.  Symptoms may be related to plantar fasciitis, arthritic changes, gout flare, fracture  and dislocation also considered but felt less likely given absence of trauma no open wounds suggestive of a cellulitis  Amount and/or Complexity of Data Reviewed Labs: ordered.    Details: Labs without significant abnormality Radiology: ordered.    Details: X-ray left foot shows OA  X-ray of right foot shows unchanged lytic erosion at the metatarsal head of the great toe.  There is no tenderness of the first metatarsal head on clinical exam Discussion of management or test interpretation with external provider(s): I suspect patient's symptoms are related to his osteoarthritis.  He does have a history of gout, there is no edema or erythema on exam suggestive of acute gout flare.  Extremities are neurovascularly intact.  There is no pain or swelling of the calfs to suggest a DVT.  Patient has reassuring chemistries, he may benefit from short course of anti-inflammatory.  Prescription for meloxicam.  He will follow-up closely outpatient with his PCP  Risk Prescription drug management.           Final Clinical Impression(s) / ED Diagnoses Final diagnoses:  Bilateral foot pain    Rx / DC Orders ED Discharge Orders          Ordered    meloxicam (MOBIC) 7.5 MG tablet  Daily        08/28/23 1521              Catherne Clubs, PA-C 08/30/23 1509    Tonya Fredrickson, MD 08/31/23 1102

## 2023-08-31 ENCOUNTER — Encounter: Payer: Self-pay | Admitting: Physical Medicine and Rehabilitation

## 2023-09-03 ENCOUNTER — Encounter (HOSPITAL_COMMUNITY): Payer: Self-pay

## 2023-09-03 ENCOUNTER — Ambulatory Visit (HOSPITAL_COMMUNITY)

## 2023-09-03 DIAGNOSIS — M542 Cervicalgia: Secondary | ICD-10-CM | POA: Diagnosis not present

## 2023-09-03 DIAGNOSIS — M5382 Other specified dorsopathies, cervical region: Secondary | ICD-10-CM

## 2023-09-03 DIAGNOSIS — M6281 Muscle weakness (generalized): Secondary | ICD-10-CM
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
## 2023-09-04 NOTE — Telephone Encounter (Signed)
 Lmtrc, on seeing if order was ever faxed over

## 2023-09-15 ENCOUNTER — Emergency Department (HOSPITAL_COMMUNITY)
Admission: EM | Admit: 2023-09-15 | Discharge: 2023-09-15 | Discharge status: Absent without leave | Source: Home / Self Care

## 2023-09-18 ENCOUNTER — Ambulatory Visit (HOSPITAL_COMMUNITY): Attending: Orthopedic Surgery

## 2023-09-18 DIAGNOSIS — M5382 Other specified dorsopathies, cervical region: Secondary | ICD-10-CM | POA: Insufficient documentation

## 2023-09-18 DIAGNOSIS — R29898 Other symptoms and signs involving the musculoskeletal system: Secondary | ICD-10-CM | POA: Diagnosis present

## 2023-09-18 DIAGNOSIS — M6281 Muscle weakness (generalized): Secondary | ICD-10-CM | POA: Diagnosis present

## 2023-09-18 DIAGNOSIS — M25512 Pain in left shoulder: Secondary | ICD-10-CM | POA: Diagnosis present

## 2023-09-18 DIAGNOSIS — M25612 Stiffness of left shoulder, not elsewhere classified: Secondary | ICD-10-CM | POA: Diagnosis present

## 2023-09-18 DIAGNOSIS — M542 Cervicalgia: Secondary | ICD-10-CM | POA: Insufficient documentation
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
## 2023-09-19 ENCOUNTER — Encounter (HOSPITAL_COMMUNITY)

## 2023-09-22 ENCOUNTER — Ambulatory Visit
Admission: RE | Admit: 2023-09-22 | Discharge: 2023-09-22 | Disposition: A | Discharge status: Home / Self Care | Source: Ambulatory Visit | Attending: Nurse Practitioner

## 2023-09-22 VITALS — BP 161/67 | HR 61 | Temp 97.9°F | Resp 20

## 2023-09-22 DIAGNOSIS — M79671 Pain in right foot: Secondary | ICD-10-CM | POA: Diagnosis not present

## 2023-09-22 DIAGNOSIS — M79672 Pain in left foot: Secondary | ICD-10-CM | POA: Diagnosis not present

## 2023-09-22 DIAGNOSIS — Z8669 Personal history of other diseases of the nervous system and sense organs: Secondary | ICD-10-CM

## 2023-09-22 MED ORDER — PREDNISONE 20 MG PO TABS: 40.0000 mg | ORAL_TABLET | Freq: Every day | ORAL | 0 refills | Status: AC

## 2023-09-22 NOTE — Discharge Instructions (Addendum)
 Take medication as prescribed.  You may take over-the-counter Tylenol  as needed for pain or discomfort. Elevate the feet above the level of the heart is much as possible to help with pain and swelling. You may perform warm Epsom salt soaks at least 2-3 times daily as needed for foot pain or discomfort. Make sure you are wearing shoes with good insole and support when you are engaged in prolonged or strenuous activities. Recommend follow-up with your primary care physician here in [place] for further evaluation of your foot pain. Follow-up as needed.

## 2023-09-22 NOTE — ED Triage Notes (Signed)
 Bilateral feet tingling x 1 year.

## 2023-09-22 NOTE — ED Provider Notes (Signed)
 [facility]    CSN: [id number] Arrival date & time: 09/22/23  1153      History   Chief Complaint No chief complaint on file.   HPI Derrick Dean is a 83 y.o. male.   The history is provided by the patient.   Patient presents for complaints of bilateral foot pain is been present for the past year.  Patient denies injury or trauma.  Describes the pain as "numbness and tingling."  He also endorses a "burning" feeling on the bottom of his feet.  Patient denies the inability to bear weight.  Patient was seen for the same or similar symptoms in the emergency department last month.  X-rays of his feet were performed, both were negative for fracture or dislocation.  It was suspected that he may be having a flare of his osteoarthritis.  Patient was prescribed meloxicam  at that time to help with inflammation.  Patient reports underlying history of neuropathy and gout. 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   Arthritis of left glenohumeral joint 09/25/2022   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   Other abnormalities of gait and mobility 01/03/2021   Gout 01/03/2021   Posttraumatic stress disorder 01/03/2021   Gastroesophageal reflux disease 01/03/2021   Hyperlipidemia 01/03/2021   Vitamin B12 deficiency 01/03/2021   Shoulder pain 01/03/2021   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   Coronary atherosclerosis 01/03/2021    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   Chronic midline low back pain without sciatica 05/24/2018   Lumbar spondylosis 05/24/2018   Pain syndrome, chronic 06/15/2017   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/15/2017   Sprain of right wrist 10/31/2016   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   S/P ORIF (open reduction internal fixation) fracture 05/05/2016   Closed fracture of right distal radius 04/18/2016   Common cold 09/25/2013   CAD S/P percutaneous coronary angioplasty 09/25/2013   HCV (hepatitis C virus) 05/15/2013   Cholelithiasis 05/15/2013   Abdominal pain, epigastric 05/15/2013   Pancreatitis, acute 05/13/2013   Essential hypertension 05/13/2013   Transaminasemia 05/13/2013   Chest pain 05/12/2013    Past Surgical History:  Procedure Laterality Date   APPENDECTOMY     CARDIAC CATHETERIZATION     7 YRS AGO W/STENT   CHOLECYSTECTOMY     Left shoulder surgery     REVERSE SHOULDER ARTHROPLASTY Left 09/25/2022   Procedure: LEFT REVERSE SHOULDER ARTHROPLASTY;  Surgeon: Tonita Frater, MD;  Location: [facility];  Service: Orthopedics;  Laterality: Left;  RNFA NEEDED   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 Authorizing Provider  allopurinol  (ZYLOPRIM ) 100 MG tablet  Take 3 tablets (300 mg total) by mouth daily. Do not start until after resolution of active gout flare 08/18/23   Zarwolo, Gloria, FNP  amLODipine  (NORVASC ) 10 MG tablet Take 10 mg by mouth daily.    [provider]  cholecalciferol (VITAMIN D ) 1000 UNITS tablet Take 1,000 Units by mouth daily.    [provider]  Cholecalciferol 1.25 MG (50000 UT) TABS Take 25 mcg by mouth daily as needed (in the morning w/ breakfast.). 02/14/23   [provider]  losartan  (COZAAR ) 50 MG tablet Take 50 mg by mouth daily.     [provider]  meloxicam  (MOBIC ) 7.5 MG tablet Take 1 tablet (7.5 mg total) by mouth daily. Take with food 08/28/23   Triplett, Tammy, PA-C  methocarbamol  (ROBAXIN -750) 750 MG tablet Take 1 tablet (750 mg total) by mouth 4 (four) times daily. 08/06/23   Zarwolo, Gloria, FNP  traZODone  (DESYREL ) 100 MG tablet Take 1 tablet (100 mg total) by mouth at bedtime. 08/06/23   Zarwolo, Gloria, FNP    Family History Family History  Problem Relation Age of Onset   Coronary artery disease Mother    Thyroid disease Mother    Heart disease Mother    Coronary artery disease Father    Heart disease Father     Social History Social History   Tobacco Use   Smoking status: Former    Current packs/day: 0.00    Types: Cigarettes    Quit date: 09/14/2001    Years since quitting: 22.0   Smokeless tobacco: Never  Vaping Use   Vaping status: Never Used  Substance Use Topics   Alcohol use: Yes    Alcohol/week: 1.0 standard drink of alcohol    Types: 1 Cans of beer per week    Comment: 3x week   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  Updated Vital Signs BP (!) 161/67 (BP Location: Right Arm)   Pulse 61   Temp 97.9 F (36.6 C) (Oral)   Resp 20   SpO2 95%   Visual Acuity Right Eye Distance:   Left Eye Distance:   Bilateral  Distance:    Right Eye Near:   Left Eye Near:    Bilateral Near:     Physical Exam Vitals and nursing note reviewed.  Constitutional:      General: He is not in acute distress.    Appearance: Normal appearance.  HENT:  Head: Normocephalic.  Eyes:     Extraocular Movements: Extraocular movements intact.     Pupils: Pupils are equal, round, and reactive to light.  Cardiovascular:     Rate and Rhythm: Normal rate and regular rhythm.     Pulses: Normal pulses.     Heart sounds: Normal heart sounds.  Pulmonary:     Effort: Pulmonary effort is normal. No respiratory distress.     Breath sounds: Normal breath sounds. No stridor. No wheezing, rhonchi or rales.  Abdominal:     General: Bowel sounds are normal.     Palpations: Abdomen is soft.     Tenderness: There is no abdominal tenderness.  Musculoskeletal:     Cervical back: Normal range of motion.  Feet:     Right foot:     Skin integrity: No ulcer, erythema or warmth.     Toenail Condition: Right toenails are normal.     Left foot:     Skin integrity: No ulcer, erythema or warmth.     Toenail Condition: Left toenails are normal.     Comments: +2 DP/PT pulses bilaterally. No swelling, deformity or erythema present.  Skin:    General: Skin is warm and dry.  Neurological:     General: No focal deficit present.     Mental Status: He is alert and oriented to person, place, and time.  Psychiatric:        Mood and Affect: Mood normal.        Behavior: Behavior normal.      UC Treatments / Results  Labs (all labs ordered are listed, but only abnormal results are displayed) Labs Reviewed - No data to display  EKG   Radiology No results found.  Procedures Procedures (including critical care time)  Medications Ordered in UC Medications - No data to display  Initial Impression / Assessment and Plan / UC Course  I have reviewed the triage vital signs and the nursing notes.  Pertinent labs & imaging results that  were available during my care of the patient were reviewed by me and considered in my medical decision making (see chart for details).  Patient presents for complaints of numbness and tingling with the burning sensation in his feet for the past year.  Patient with previous imaging done last month which was negative for fracture or dislocation.  Patient reports underlying history of gout and neuropathy.  Neurovascular status of both feet were intact.  Patient's symptoms are consistent with neuropathic pain.  Will start prednisone  40 mg for the next 5 days.  Patient was advised it is recommended that he follow-up with his PCP for further evaluation of his neuropathy.  Supportive care recommendations were provided and discussed with the patient to include over-the-counter analgesics, elevating the lower extremities, and wearing shoes with good insole and support.  Patient was in agreement with this plan of care and verbalizes understanding.  All questions were answered.  Patient stable for discharge.   Final Clinical Impressions(s) / UC Diagnoses   Final diagnoses:  Bilateral foot pain  History of neuropathy   Discharge Instructions   None    ED Prescriptions   None    PDMP not reviewed this encounter.   Hardy Lia, NP 09/22/23 1242

## 2023-09-26 ENCOUNTER — Encounter (HOSPITAL_COMMUNITY)

## 2023-09-28 ENCOUNTER — Encounter (HOSPITAL_COMMUNITY)

## 2023-10-01 ENCOUNTER — Encounter (HOSPITAL_COMMUNITY)

## 2023-10-03 ENCOUNTER — Encounter (HOSPITAL_COMMUNITY)

## 2023-10-23 ENCOUNTER — Ambulatory Visit
Admission: EM | Admit: 2023-10-23 | Discharge: 2023-10-23 | Disposition: A | Discharge status: Home / Self Care | Source: Ambulatory Visit | Attending: Nurse Practitioner | Admitting: Nurse Practitioner

## 2023-10-23 DIAGNOSIS — Z8739 Personal history of other diseases of the musculoskeletal system and connective tissue: Secondary | ICD-10-CM

## 2023-10-23 DIAGNOSIS — G5793 Unspecified mononeuropathy of bilateral lower limbs: Secondary | ICD-10-CM

## 2023-10-23 DIAGNOSIS — Z8669 Personal history of other diseases of the nervous system and sense organs: Secondary | ICD-10-CM | POA: Diagnosis not present

## 2023-10-23 MED ORDER — GABAPENTIN 100 MG PO CAPS: 100.0000 mg | ORAL_CAPSULE | Freq: Three times a day (TID) | ORAL | 0 refills | Status: AC

## 2023-10-23 MED ORDER — PREDNISONE 20 MG PO TABS: 40.0000 mg | ORAL_TABLET | Freq: Every day | ORAL | 0 refills | Status: AC

## 2023-10-23 NOTE — Discharge Instructions (Addendum)
 Take medication as prescribed.  You may take over-the-counter Tylenol  as needed for pain or discomfort. Elevate the feet above the level of the heart is much as possible to help with pain and swelling. You may perform warm Epsom salt soaks at least 2-3 times daily as needed for foot pain or discomfort. Make sure you are wearing shoes with good insoles and support when you are engaged in prolonged or strenuous activities. Please follow-up with your primary care physician for further evaluation. Follow-up as needed.

## 2023-10-23 NOTE — ED Provider Notes (Signed)
 [facility]    CSN: [id number] Arrival date & time: 10/23/23  1009      History   Chief Complaint No chief complaint on file.   HPI Derrick Dean is a 83 y.o. male.   The history is provided by the patient.   Patient presents for complaints of pain and burning in his bilateral feet that been present for the past 3 days.  Patient has been seen for the same or similar symptoms over the past 2 months, patient with history of neuropathy.  States he has not seen his PCP since he was last seen in this office.  Patient states that the pain became so bad in his feet that he could not walk for the past several days.  He states that he has not noticed any swelling or bruising to his feet.  States he has not taken any medication for his symptoms.  Per review of his previous visit note, x-rays of his feet were performed in the emergency department in May, both were negative.  Patient also with underlying history of gout.  The patient denies fever, chills, injury, trauma, bruising, or radiation of pain.  Patient states he is attempting to see his PCP at the [facility] for his symptoms.  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   Arthritis of left glenohumeral joint 09/25/2022   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   Other abnormalities of gait and mobility 01/03/2021   Gout 01/03/2021   Posttraumatic stress disorder 01/03/2021    Gastroesophageal reflux disease 01/03/2021   Hyperlipidemia 01/03/2021   Vitamin B12 deficiency 01/03/2021   Shoulder pain 01/03/2021   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   Coronary atherosclerosis 01/03/2021   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   Chronic midline low back pain without sciatica 05/24/2018   Lumbar spondylosis 05/24/2018   Pain syndrome, chronic 06/15/2017   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/15/2017   Sprain of right wrist 10/31/2016   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   S/P ORIF (open reduction internal fixation) fracture 05/05/2016   Closed fracture of right distal radius 04/18/2016   Common cold 09/25/2013   CAD S/P percutaneous coronary angioplasty 09/25/2013   HCV (hepatitis C virus) 05/15/2013   Cholelithiasis 05/15/2013   Abdominal pain, epigastric 05/15/2013   Pancreatitis, acute 05/13/2013   Essential hypertension 05/13/2013   Transaminasemia 05/13/2013   Chest pain 05/12/2013    Past Surgical History:  Procedure Laterality Date   APPENDECTOMY     CARDIAC CATHETERIZATION     7 YRS AGO W/STENT   CHOLECYSTECTOMY     Left shoulder surgery     REVERSE SHOULDER ARTHROPLASTY Left 09/25/2022   Procedure: LEFT REVERSE SHOULDER ARTHROPLASTY;  Surgeon: Onesimo Oneil DELENA, MD;  Location: [facility];  Service: Orthopedics;  Laterality: Left;  RNFA NEEDED  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 Authorizing Provider  allopurinol  (ZYLOPRIM ) 100 MG tablet Take 3 tablets (300 mg total) by mouth daily. Do not start until after resolution of active gout flare 08/18/23   Zarwolo, Gloria, FNP  amLODipine  (NORVASC ) 10 MG tablet Take 10 mg by mouth daily.    [provider]  cholecalciferol (VITAMIN D ) 1000 UNITS tablet Take 1,000 Units by mouth daily.    [provider]  Cholecalciferol 1.25 MG (50000 UT) TABS Take 25 mcg by mouth daily as needed (in the morning w/ breakfast.). 02/14/23   [provider]  losartan  (COZAAR ) 50 MG tablet Take 50 mg by mouth daily.    [provider]  meloxicam  (MOBIC ) 7.5 MG tablet Take 1 tablet (7.5 mg total) by mouth daily. Take with food 08/28/23   Triplett, Tammy, PA-C  methocarbamol  (ROBAXIN -750) 750 MG tablet Take 1 tablet (750 mg total) by mouth 4 (four) times daily. 08/06/23   Zarwolo, Gloria, FNP  traZODone  (DESYREL ) 100 MG tablet Take 1 tablet (100 mg total) by mouth at bedtime. 08/06/23   Zarwolo, Gloria, FNP    Family History Family History  Problem Relation Age of Onset   Coronary artery disease Mother    Thyroid disease Mother    Heart disease Mother    Coronary artery disease Father    Heart disease Father     Social History Social History   Tobacco Use   Smoking status: Former    Current packs/day: 0.00    Types: Cigarettes    Quit date: 09/14/2001    Years since quitting: 22.1   Smokeless tobacco: Never  Vaping Use   Vaping status: Never Used  Substance Use Topics   Alcohol use: Yes    Alcohol/week: 1.0 standard drink of alcohol    Types: 1 Cans of beer per week    Comment: 3x week   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     Allergies   Lisinopril   Review of Systems Review of Systems Per HPI  Physical Exam Triage Vital Signs ED Triage Vitals  Encounter Vitals Group     BP 10/23/23 1018 132/69     Girls Systolic BP Percentile --      Girls Diastolic BP Percentile --      Boys Systolic BP Percentile --      Boys Diastolic BP Percentile --      Pulse Rate 10/23/23 1018 67     Resp --      Temp 10/23/23 1018 98.7 F (37.1 C)     Temp Source 10/23/23 1018 Oral     SpO2 --      Weight --      Height --      Head Circumference --      Peak Flow --      Pain Score 10/23/23 1021 5     Pain Loc --       Pain Education --      Exclude from Growth Chart --    No data found.  Updated Vital Signs BP 132/69 (BP Location: Right Arm)   Pulse 67   Temp 98.7 F (37.1 C) (Oral)   Visual Acuity Right Eye  Distance:   Left Eye Distance:   Bilateral Distance:    Right Eye Near:   Left Eye Near:    Bilateral Near:     Physical Exam Vitals and nursing note reviewed.  Constitutional:      General: He is not in acute distress.    Appearance: Normal appearance.  HENT:     Head: Normocephalic.  Eyes:     Extraocular Movements: Extraocular movements intact.     Conjunctiva/sclera: Conjunctivae normal.     Pupils: Pupils are equal, round, and reactive to light.  Cardiovascular:     Rate and Rhythm: Normal rate and regular rhythm.     Pulses: Normal pulses.     Heart sounds: Normal heart sounds.  Pulmonary:     Effort: Pulmonary effort is normal.     Breath sounds: Normal breath sounds.  Abdominal:     General: Bowel sounds are normal.     Palpations: Abdomen is soft.     Tenderness: There is no abdominal tenderness.  Musculoskeletal:     Cervical back: Normal range of motion.     Right foot: Normal range of motion and normal capillary refill. No swelling or deformity. Normal pulse.     Left foot: Normal range of motion and normal capillary refill. No swelling or deformity. Normal pulse.  Feet:     Right foot:     Skin integrity: Skin integrity normal. No ulcer, skin breakdown or erythema.     Toenail Condition: Right toenails are normal.     Left foot:     Skin integrity: Skin integrity normal. No ulcer, skin breakdown or erythema.     Toenail Condition: Left toenails are normal.  Lymphadenopathy:     Cervical: No cervical adenopathy.  Skin:    General: Skin is warm and dry.  Neurological:     General: No focal deficit present.     Mental Status: He is alert and oriented to person, place, and time.  Psychiatric:        Mood and Affect: Mood normal.        Behavior: Behavior  normal.      UC Treatments / Results  Labs (all labs ordered are listed, but only abnormal results are displayed) Labs Reviewed - No data to display  EKG   Radiology No results found.  Procedures Procedures (including critical care time)  Medications Ordered in UC Medications - No data to display  Initial Impression / Assessment and Plan / UC Course  I have reviewed the triage vital signs and the nursing notes.  Pertinent labs & imaging results that were available during my care of the patient were reviewed by me and considered in my medical decision making (see chart for details).  Patient presents for complaints of numbness and tingling with the burning sensation in his feet for the past year.  Patient presented with the same or similar symptoms approximately 2 months ago.  He did have previous imaging done in the emergency department of his feet in May of this year which were negative for fracture or dislocation. Patient reports underlying history of gout and neuropathy.  Today, neurovascular status of both feet were intact.  There is no obvious bruising, swelling, or erythema present to suggest a gout flare.  Patient's symptoms are consistent with ongoing neuropathic pain.  Will start gabapentin  100 mg 3 times daily along with prednisone  40 mg daily for the next 5 days to treat for both neuropathy and gout.  Supportive  care recommendations were provided discussed with the patient to include over-the-counter Tylenol , warm Epsom salt soaks, and wearing shoes with good insoles and support.  Advised patient that it is recommended that he follow-up with his PCP for further evaluation and treatment of his neuropathy.  Patient was also given ER follow-up precautions.  Patient was in agreement with this plan of care and verbalizes understanding.  All questions were answered.  Patient stable for discharge.  Final Clinical Impressions(s) / UC Diagnoses   Final diagnoses:  None   Discharge  Instructions   None    ED Prescriptions   None    PDMP not reviewed this encounter.   Gilmer Etta PARAS, NP 10/23/23 1055

## 2023-10-23 NOTE — ED Triage Notes (Signed)
 Pt reports burning on the top and bottom of both feet x 3 days

## 2023-10-31 ENCOUNTER — Encounter: Attending: Physical Medicine and Rehabilitation | Admitting: Physical Medicine and Rehabilitation

## 2023-11-05 ENCOUNTER — Ambulatory Visit: Admitting: Family Medicine

## 2023-11-06 ENCOUNTER — Ambulatory Visit: Admitting: Nurse Practitioner

## 2023-11-13 ENCOUNTER — Ambulatory Visit: Admitting: Nurse Practitioner

## 2023-11-14 ENCOUNTER — Ambulatory Visit: Payer: Self-pay

## 2023-11-14 VITALS — Ht 70.5 in | Wt 170.0 lb

## 2023-11-14 DIAGNOSIS — Z0000 Encounter for general adult medical examination without abnormal findings: Secondary | ICD-10-CM

## 2023-11-14 NOTE — Patient Instructions (Signed)
 Ms. Derrick Dean ,  Thank you for taking time out of your busy schedule to complete your Annual Wellness Visit with me. I enjoyed our conversation and look forward to speaking with you again next year. I, as well as your care team,  appreciate your ongoing commitment to your health goals. Please review the following plan we discussed and let me know if I can assist you in the future.  I enjoyed our conversation and look forward to it again next year. Blessing for the upcoming year!!  -Shakinah Navis     TOGETHER, WE CAME UP WITH THE FOLLOWING GAME PLAN!  Referrals/Orders/Labs Placed Today:  N/a Follow up Visits Primary Care Provider: November 3, 2:20pm Medicare Annual Wellness: November 17, 2024 at 2:30pm Clinician Recommendations:  Aim for 30 minutes of exercise or brisk walking, 6-8 glasses of water , and 5 servings of fruits and vegetables each day.       This is a list of the screening recommended for you and due dates:  Health Maintenance  Topic Date Due   DTaP/Tdap/Td vaccine (1 - Tdap) Never done   Zoster (Shingles) Vaccine (2 of 2) 08/21/2021   COVID-19 Vaccine (3 - 2024-25 season) 12/17/2022   Flu Shot  11/16/2023   Medicare Annual Wellness Visit  11/04/2024   DEXA scan (bone density measurement)  08/12/2025   Pneumococcal Vaccine for age over 7  Completed   Hepatitis B Vaccine  Aged Out   HPV Vaccine  Aged Out   Meningitis B Vaccine  Aged Out   Hepatitis C Screening  Discontinued    Advanced directives: (Declined) Advance directive discussed with you today. Even though you declined this today, please call our office should you change your mind, and we can give you the proper paperwork for you to fill out. Advance Care Planning is important because it:  [x]  Makes sure you receive the medical care that is consistent with your values, goals, and preferences  [x]  It provides guidance to your family and loved ones and reduces their decisional burden about whether or not they are making the  right decisions based on your wishes.  Follow the link provided in your after visit summary or read over the paperwork we have mailed to you to help you started getting your Advance Directives in place. If you need assistance in completing these, please reach out to us  so that we can help you!  Follow the link provided in your after visit summary or read over the paperwork we have mailed to you to help you started getting your Advance Directives in place. If you need assistance in completing these, please reach out to us  so that we can help you!  We will mail you an Advanced Directives packet as we discussed during your visit today. You do not have to have an attorney to complete this paperwork. Read over the packet, discuss it with family, and complete it. Choose who will be making decisions for you in the event you can no longer make them for yourself. Once completed have them notarized, and bring the packet back to the office. We will scan it and make sure it gets into your chart.   If you choose to have a DNR (Do Not Resuscitate Order) you must get this from your primary care doctor because they have to sign it. You can get this in the office during an appointment.   THIS ORDER MUST BE VISIBLE AT ALL TIMES WITHIN YOUR HOME SUCH AS ON A REFRIGERATOR.

## 2023-11-14 NOTE — Progress Notes (Signed)
 Please attest and cosign this visit due to patients primary care provider not being in the office at the time the visit was completed.   Subjective:   Derrick Dean is a 83 y.o. who presents for a Medicare Wellness preventive visit.  As a reminder, Annual Wellness Visits don't include a physical exam, and some assessments may be limited, especially if this visit is performed virtually. We may recommend an in-person follow-up visit with your provider if needed.  Visit Complete: Virtual I connected with  Derrick Dean on 11/14/23 by a audio enabled telemedicine application and verified that I am speaking with the correct person using two identifiers.  Patient Location: Home  Provider Location: Home Office  I discussed the limitations of evaluation and management by telemedicine. The patient expressed understanding and agreed to proceed.  Vital Signs: Because this visit was a virtual/telehealth visit, some criteria may be missing or patient reported. Any vitals not documented were not able to be obtained and vitals that have been documented are patient reported.  VideoDeclined- This patient declined Librarian, academic. Therefore the visit was completed with audio only.  Persons Participating in Visit: Patient.  AWV Questionnaire: No: Patient Medicare AWV questionnaire was not completed prior to this visit.  Cardiac Risk Factors include: advanced age (>39men, >8 women);hypertension;male gender     Objective:    Today's Vitals   11/14/23 1627  Weight: 170 lb (77.1 kg)  Height: 5' 10.5 (1.791 m)   Body mass index is 24.05 kg/m.     11/14/2023    4:35 PM 08/28/2023   12:09 PM 06/11/2023    1:51 PM 03/09/2023    2:34 PM 03/01/2023    8:50 PM 09/25/2022    6:25 AM 09/18/2022    3:23 PM  Advanced Directives  Does Patient Have a Medical Advance Directive? No No Yes No Yes No No  Type of Best boy of [place];Living will   Living will    Does patient want to make changes to medical advance directive?   No - Patient declined    No - Patient declined  Would patient like information on creating a medical advance directive? No - Patient declined No - Patient declined    No - Patient declined No - Patient declined    Current Medications (verified) Outpatient Encounter Medications as of 11/14/2023  Medication Sig   allopurinol  (ZYLOPRIM ) 100 MG tablet Take 3 tablets (300 mg total) by mouth daily. Do not start until after resolution of active gout flare   amLODipine  (NORVASC ) 10 MG tablet Take 10 mg by mouth daily.   cholecalciferol (VITAMIN D ) 1000 UNITS tablet Take 1,000 Units by mouth daily.   Cholecalciferol 1.25 MG (50000 UT) TABS Take 25 mcg by mouth daily as needed (in the morning w/ breakfast.).   gabapentin  (NEURONTIN ) 100 MG capsule Take 1 capsule (100 mg total) by mouth 3 (three) times daily.   losartan  (COZAAR ) 50 MG tablet Take 50 mg by mouth daily.   meloxicam  (MOBIC ) 7.5 MG tablet Take 1 tablet (7.5 mg total) by mouth daily. Take with food   methocarbamol  (ROBAXIN -750) 750 MG tablet Take 1 tablet (750 mg total) by mouth 4 (four) times daily.   traZODone  (DESYREL ) 100 MG tablet Take 1 tablet (100 mg total) by mouth at bedtime.   No facility-administered encounter medications on file as of 11/14/2023.    Allergies (verified) Lisinopril   History: Past Medical History:  Diagnosis Date  Anemia    Appendicitis    Arthritis    B12 nutritional deficiency    on supplement   GERD (gastroesophageal reflux disease)    Gout    Hepatitis C infection    not treated - pending appointment in april 2015   History of heart artery stent    Hypertension    Presbyopia    wears bifocals   Trouble in sleeping    Past Surgical History:  Procedure Laterality Date   APPENDECTOMY     CARDIAC CATHETERIZATION     7 YRS AGO W/STENT   CHOLECYSTECTOMY     Left shoulder surgery     REVERSE SHOULDER ARTHROPLASTY  Left 09/25/2022   Procedure: LEFT REVERSE SHOULDER ARTHROPLASTY;  Surgeon: Onesimo Oneil LABOR, MD;  Location: [facility];  Service: Orthopedics;  Laterality: Left;  RNFA NEEDED   Right Hip Replacement Right 01/27/2020   Right Shoulder surgery     TOTAL KNEE ARTHROPLASTY Bilateral    Family History  Problem Relation Age of Onset   Coronary artery disease Mother    Thyroid disease Mother    Heart disease Mother    Coronary artery disease Father    Heart disease Father    Social History   Socioeconomic History   Marital status: Single    Spouse name: Not on file   Number of children: 6   Years of education: Not on file   Highest education level: Not on file  Occupational History   Occupation: diabled veteran   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  Tobacco Use   Smoking status: Former    Current packs/day: 0.00    Types: Cigarettes    Quit date: 09/14/2001    Years since quitting: 22.1   Smokeless tobacco: Never  Vaping Use   Vaping status: Never Used  Substance and Sexual Activity   Alcohol use: Yes    Alcohol/week: 1.0 standard drink of alcohol    Types: 1 Cans of beer per week    Comment: 3x week   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   Sexual activity: Not Currently  Other Topics Concern   Not on file  Social History Narrative   Divorced 2 times, few daughters   Social Drivers of Corporate investment banker Strain: Low Risk  (11/14/2023)   Overall Financial Resource Strain (CARDIA)    Difficulty of Paying Living Expenses: Not hard at all  Food Insecurity: No Food Insecurity (11/14/2023)   Hunger Vital Sign    Worried About Running Out of Food in the Last Year: Never true    Ran Out of Food in the Last Year: Never true  Transportation Needs: No Transportation Needs (11/14/2023)   PRAPARE - Administrator, Civil Service (Medical): No    Lack of Transportation (Non-Medical): No  Physical Activity: Sufficiently Active  (11/14/2023)   Exercise Vital Sign    Days of Exercise per Week: 7 days    Minutes of Exercise per Session: 30 min  Stress: No Stress Concern Present (11/14/2023)   [employer] of Occupational Health - Occupational Stress Questionnaire    Feeling of Stress: Not at all  Social Connections: Moderately Integrated (11/14/2023)   Social Connection and Isolation Panel    Frequency of Communication with Friends and Family: More than three times a week    Frequency of Social Gatherings with Friends and Family: More than three times a week  Attends Religious Services: More than 4 times per year    Active Member of Clubs or Organizations: Yes    Attends Banker Meetings: More than 4 times per year    Marital Status: Divorced    Tobacco Counseling Counseling given: Yes    Clinical Intake:  Pre-visit preparation completed: Yes  Pain : No/denies pain     BMI - recorded: 24.05 Nutritional Status: BMI of 19-24  Normal Nutritional Risks: None Diabetes: No  Lab Results  Component Value Date   HGBA1C 6.1 (H) 08/06/2023   HGBA1C 6.1 (H) 05/12/2013     How often do you need to have someone help you when you read instructions, pamphlets, or other written materials from your doctor or pharmacy?: 1 - Never  Interpreter Needed?: No  Information entered by :: [facility]   Activities of Daily Living     11/14/2023    4:36 PM  In your present state of health, do you have any difficulty performing the following activities:  Hearing? 0  Vision? 0  Difficulty concentrating or making decisions? 0  Walking or climbing stairs? 0  Dressing or bathing? 0  Doing errands, shopping? 0  Preparing Food and eating ? N  Using the Toilet? N  In the past six months, have you accidently leaked urine? N  Do you have problems with loss of bowel control? N  Managing your Medications? N  Managing your Finances? N  Housekeeping or managing your Housekeeping? Y    Patient Care  Team: Zarwolo, Gloria, FNP as PCP - General (Family Medicine) Center, [facility] as Referring Physician (General Practice)  I have updated your Care Teams any recent Medical Services you may have received from other providers in the past year.     Assessment:   This is a routine wellness examination for Franz.  Hearing/Vision screen Hearing Screening - Comments:: Patient denies any hearing difficulties.   Vision Screening - Comments:: Wears rx glasses - up to date with routine eye exams with  [facility]   Goals Addressed             This Visit's Progress    Patient Stated       I'm going to go to [place]       Depression Screen     11/14/2023    4:55 PM 08/16/2023    2:11 PM 08/06/2023    3:48 PM  PHQ 2/9 Scores  PHQ - 2 Score 0 0 1  PHQ- 9 Score 0 0 5    Fall Risk     11/14/2023    4:54 PM 08/16/2023    2:11 PM 08/06/2023    3:47 PM  Fall Risk   Falls in the past year? 0 1 1  Number falls in past yr: 0 1 1  Injury with Fall? 0 0 0  Risk for fall due to : No Fall Risks  Impaired balance/gait  Follow up Falls evaluation completed;Education provided;Falls prevention discussed Falls evaluation completed Falls evaluation completed;Follow up appointment    MEDICARE RISK AT HOME:  Medicare Risk at Home Any stairs in or around the home?: No If so, are there any without handrails?: No Home free of loose throw rugs in walkways, pet beds, electrical cords, etc?: Yes Adequate lighting in your home to reduce risk of falls?: Yes Life alert?: No Use of a cane, walker or w/c?: Yes Grab bars in the bathroom?: Yes Shower chair or bench in shower?: Yes  Elevated toilet seat or a handicapped toilet?: Yes  TIMED UP AND GO:  Was the test performed?  No  Cognitive Function: 6CIT completed        11/14/2023    4:54 PM  6CIT Screen  What Year? 0 points  What month? 0 points  What time? 0 points  Count back from 20 0 points  Months in reverse 0 points  Repeat  phrase 0 points  Total Score 0 points    Immunizations Immunization History  Administered Date(s) Administered   Hep A / Hep B 10/01/2012   Td (Adult),unspecified 10/30/2003   Tdap 07/15/2012, 01/01/2021    Screening Tests Health Maintenance  Topic Date Due   Zoster Vaccines- Shingrix (1 of 2) Never done   Hepatitis B Vaccines (2 of 3 - Risk 3-dose series) 10/29/2012   COVID-19 Vaccine (1 - 2024-25 season) Never done   Pneumococcal Vaccine: 50+ Years (1 of 2 - PCV) 08/05/2024 (Originally 09/21/1959)   INFLUENZA VACCINE  11/16/2023   Medicare Annual Wellness (AWV)  11/13/2024   DTaP/Tdap/Td (3 - Td or Tdap) 01/02/2031   HPV VACCINES  Aged Out   Meningococcal B Vaccine  Aged Out   Hepatitis C Screening  Discontinued    Health Maintenance  Health Maintenance Due  Topic Date Due   Zoster Vaccines- Shingrix (1 of 2) Never done   Hepatitis B Vaccines (2 of 3 - Risk 3-dose series) 10/29/2012   COVID-19 Vaccine (1 - 2024-25 season) Never done   Health Maintenance Items Addressed: Routine health screenings up to date. Patient aware of recommended vaccines and verbalizes understanding of where to have this done at.   Additional Screening:  Vision Screening: Recommended annual ophthalmology exams for early detection of glaucoma and other disorders of the eye. Would you like a referral to an eye doctor? No    Dental Screening: Recommended annual dental exams for proper oral hygiene  Community Resource Referral / Chronic Care Management: CRR required this visit?  No   CCM required this visit?  No   Plan:    I have personally reviewed and noted the following in the patient's chart:   Medical and social history Use of alcohol, tobacco or illicit drugs  Current medications and supplements including opioid prescriptions. Patient is not currently taking opioid prescriptions. Functional ability and status Nutritional status Physical activity Advanced directives List of other  physicians Hospitalizations, surgeries, and ER visits in previous 12 months Vitals Screenings to include cognitive, depression, and falls Referrals and appointments  In addition, I have reviewed and discussed with patient certain preventive protocols, quality metrics, and best practice recommendations. A written personalized care plan for preventive services as well as general preventive health recommendations were provided to patient.   Ticia Virgo, CMA   11/14/2023   After Visit Summary: (Mail) Due to this being a telephonic visit, the after visit summary with patients personalized plan was offered to patient via mail   Notes: Nothing significant to report at this time.

## 2023-11-20 ENCOUNTER — Ambulatory Visit: Payer: 59 | Admitting: Orthopedic Surgery

## 2023-11-20 DIAGNOSIS — I679 Cerebrovascular disease, unspecified: Secondary | ICD-10-CM | POA: Insufficient documentation

## 2023-11-20 DIAGNOSIS — Z961 Presence of intraocular lens: Secondary | ICD-10-CM | POA: Insufficient documentation

## 2023-11-20 DIAGNOSIS — M1A9XX0 Chronic gout, unspecified, without tophus (tophi): Secondary | ICD-10-CM | POA: Insufficient documentation

## 2023-11-20 DIAGNOSIS — Z7689 Persons encountering health services in other specified circumstances: Secondary | ICD-10-CM | POA: Insufficient documentation

## 2023-11-20 DIAGNOSIS — F0150 Vascular dementia without behavioral disturbance: Secondary | ICD-10-CM | POA: Insufficient documentation

## 2023-11-20 DIAGNOSIS — R41841 Cognitive communication deficit: Secondary | ICD-10-CM | POA: Insufficient documentation

## 2023-11-27 ENCOUNTER — Ambulatory Visit: Admitting: Orthopedic Surgery

## 2023-11-28 ENCOUNTER — Ambulatory Visit: Admitting: Orthopaedic Surgery

## 2023-11-28 ENCOUNTER — Encounter: Payer: Self-pay | Admitting: Orthopaedic Surgery

## 2023-11-28 ENCOUNTER — Other Ambulatory Visit (INDEPENDENT_AMBULATORY_CARE_PROVIDER_SITE_OTHER)

## 2023-11-28 DIAGNOSIS — Z96612 Presence of left artificial shoulder joint: Secondary | ICD-10-CM | POA: Diagnosis not present

## 2023-11-28 DIAGNOSIS — M25512 Pain in left shoulder: Secondary | ICD-10-CM

## 2023-11-28 DIAGNOSIS — G8929 Other chronic pain: Secondary | ICD-10-CM | POA: Diagnosis not present

## 2023-11-28 NOTE — Progress Notes (Signed)
 I have some pain in my shoulder at times.  He has had some pain in the left shoulder more with overhead use.  He has no trauma.  He has not done exercises in a while. Dr. Onesimo is out of the office today.  Wounds well healed left shoulder,  NV intact.  Motion is good with pain in forward past 120.  Grips good.  Extension is full, adduction and abduction are good.  There is no effusion or redness.  Encounter Diagnoses  Name Primary?   Chronic left shoulder pain Yes   Status post reverse arthroplasty of left shoulder    I will have OT see him for exercise program.  X-rays done today, reported separately, look good today.  Return in three weeks.  Call if any problem.  Precautions discussed.  Electronically Signed Lemond Stable, MD 8/13/20253:46 PM

## 2023-12-03 ENCOUNTER — Ambulatory Visit (HOSPITAL_COMMUNITY): Attending: Orthopedic Surgery | Admitting: Occupational Therapy

## 2023-12-03 ENCOUNTER — Encounter (HOSPITAL_COMMUNITY): Payer: Self-pay | Admitting: Occupational Therapy

## 2023-12-03 ENCOUNTER — Other Ambulatory Visit: Payer: Self-pay

## 2023-12-03 DIAGNOSIS — G8929 Other chronic pain: Secondary | ICD-10-CM | POA: Insufficient documentation

## 2023-12-03 DIAGNOSIS — M25512 Pain in left shoulder: Secondary | ICD-10-CM | POA: Diagnosis present

## 2023-12-03 DIAGNOSIS — Z96612 Presence of left artificial shoulder joint: Secondary | ICD-10-CM | POA: Diagnosis not present

## 2023-12-03 DIAGNOSIS — R29898 Other symptoms and signs involving the musculoskeletal system: Secondary | ICD-10-CM | POA: Insufficient documentation

## 2023-12-03 NOTE — Therapy (Addendum)
 OUTPATIENT OCCUPATIONAL THERAPY ORTHO EVALUATION  Patient Name: Derrick Dean MRN: [id number] DOB:24-Jan-1941, 83 y.o., male Today's Date: 12/03/2023    END OF SESSION:  OT End of Session - 12/03/23 1418     Visit Number 1    Number of Visits 1    Date for OT Re-Evaluation 12/04/23    Authorization Type UHC Dual Complete    Authorization Time Period no auth required    Progress Note Due on Visit 10    OT Start Time 1348    OT Stop Time 1417    OT Time Calculation (min) 29 min    Activity Tolerance Patient tolerated treatment well    Behavior During Therapy WFL for tasks assessed/performed          Past Medical History:  Diagnosis Date   Anemia    Appendicitis    Arthritis    B12 nutritional deficiency    on supplement   GERD (gastroesophageal reflux disease)    Gout    Hepatitis C infection    not treated - pending appointment in april 2015   History of heart artery stent    Hypertension    Presbyopia    wears bifocals   Trouble in sleeping    Past Surgical History:  Procedure Laterality Date   APPENDECTOMY     CARDIAC CATHETERIZATION     7 YRS AGO W/STENT   CHOLECYSTECTOMY     Left shoulder surgery     REVERSE SHOULDER ARTHROPLASTY Left 09/25/2022   Procedure: LEFT REVERSE SHOULDER ARTHROPLASTY;  Surgeon: Onesimo Oneil DELENA, MD;  Location: [facility];  Service: Orthopedics;  Laterality: Left;  RNFA NEEDED   Right Hip Replacement Right 01/27/2020   Right Shoulder surgery     TOTAL KNEE ARTHROPLASTY Bilateral    Patient Active Problem List   Diagnosis Date Noted   Cerebrovascular disease, unspecified 11/20/2023   Chronic gouty arthritis 11/20/2023   Cognitive communication deficit 11/20/2023   Persons encountering health services in other specified circumstances 11/20/2023   Presence of intraocular lens 11/20/2023   Vascular dementia (HCC) 11/20/2023   Vascular dementia (HCC) 11/20/2023   Prediabetes 08/18/2023   Insomnia 08/10/2023   Neck pain, bilateral  08/10/2023   Chronic left shoulder pain 08/10/2023   Osteoarthritis of hips, 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   Arthritis of left glenohumeral joint 09/25/2022   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   Other abnormalities of gait and mobility 01/03/2021   Gout 01/03/2021   Posttraumatic stress disorder 01/03/2021   Gastroesophageal reflux disease 01/03/2021   Hyperlipidemia 01/03/2021   Vitamin B12 deficiency 01/03/2021   Shoulder pain 01/03/2021   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   Coronary atherosclerosis 01/03/2021   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   Chronic midline low back pain without sciatica 05/24/2018   Lumbar spondylosis 05/24/2018   Pain syndrome, chronic 06/15/2017   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/15/2017   Sprain of right wrist 10/31/2016   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   S/P ORIF (open reduction internal fixation) fracture 05/05/2016   Closed fracture of right distal radius 04/18/2016   Common cold 09/25/2013   CAD S/P percutaneous coronary angioplasty 09/25/2013   HCV (hepatitis C virus) 05/15/2013   Cholelithiasis 05/15/2013   Abdominal pain, epigastric 05/15/2013   Pancreatitis, acute 05/13/2013   Essential hypertension 05/13/2013   Transaminasemia 05/13/2013   Chest pain 05/12/2013   PCP: Gloria Zarwolo, FNP  REFERRING PROVIDER: Dr. Lemond Stable  ONSET DATE: Chronic  REFERRING DIAG:  M25.512,G89.29 (ICD-10-CM) - Chronic left shoulder pain  Z96.612 (ICD-10-CM) - Status post reverse arthroplasty of left shoulder    THERAPY DIAG:  Acute pain of left shoulder  Other symptoms and signs involving the musculoskeletal system  Rationale for Evaluation and Treatment: Rehabilitation  SUBJECTIVE:   SUBJECTIVE STATEMENT: S: I haven't done my exercises Pt accompanied by: self  PERTINENT HISTORY: Pt is a 83 y/o male presenting for chronic left shoulder pain. Pt has been through multiple rounds of therapy at this  point, is non-compliant with HEP. Pt with hx of left TSA in July 2024 and chronic cervical neck issues. MD referred pt for additional HEP as pt reports he lost his original.   PRECAUTIONS: None  WEIGHT BEARING RESTRICTIONS: No  PAIN:  Are you having pain? Yes: NPRS scale: 6/10 Pain location: neck Pain description: aching Aggravating factors: moving his neck Relieving factors: sleeping  FALLS: Has patient fallen in last 6 months? No  PLOF: Independent  PATIENT GOALS: To have less pain in the neck.  NEXT MD VISIT: 12/19/23  OBJECTIVE:  Note: Objective measures were completed at Evaluation unless otherwise noted.  HAND DOMINANCE: Right  ADLs: Pt has difficulty with turning his head side to side and looking up and down, reaching overhead, putting shirt and shoes on.    FUNCTIONAL OUTCOME MEASURES: UEFS  Extreme difficulty/unable (0), Quite a bit of difficulty (1), Moderate difficulty (2), Little difficulty (3), No difficulty (4) Survey date:    Any of your usual work, household or school activities 3  2. Your usual hobbies, recreational/sport activities 4 (has none)   3. Lifting a bag of groceries to waist level 4   4. Lifting a bag of groceries above your head 1  5. Grooming your hair 3  6. Pushing up on your hands (I.e. from bathtub or chair) 2  7. Preparing food (I.e. peeling/cutting) 4  8. Driving  4  9. Vacuuming, sweeping, or raking 4  10. Dressing  1  11. Doing up buttons 2  12. Using tools/appliances 4  13. Opening doors 4  14. Cleaning  4  15. Tying or lacing shoes 1  16. Sleeping  3  17. Laundering clothes (I.e. washing, ironing, folding) 4  18. Opening a jar 1  19. Throwing a ball 2  20. Carrying a small suitcase with your affected limb.  4  Score total:  59      UPPER EXTREMITY ROM:     Active ROM Left eval  Shoulder flexion 104  Shoulder abduction 100  Shoulder internal rotation 90  Shoulder external rotation 48  (Blank rows = not  tested)   UPPER EXTREMITY MMT:     MMT Left eval  Shoulder flexion 5/5  Shoulder abduction 4/5  Shoulder internal rotation 4/5  Shoulder external rotation 3/5  (Blank rows = not tested)   EDEMA: None  COGNITION: Overall cognitive status: Within functional limits for tasks assessed   TREATMENT DATE:  Eval:  -A/ROM: sitting-protraction, flexion, abduction, horizontal abduction, er, 10 reps  -Scapular theraband: red-row, extension, retraction, 10 reps  PATIENT EDUCATION: Education details: Shoulder A/ROM; scapular theraband-red Person educated: Patient Education method: Explanation, Demonstration, and Handouts Education comprehension: verbalized understanding and returned demonstration  HOME EXERCISE PROGRAM: Shoulder A/ROM; scapular theraband-red  GOALS: Goals reviewed with patient? Yes  SHORT TERM GOALS: Target date: 12/03/23  Pt will be provided with and educated on HEP to improve ability to use LUE during ADLs as non-dominant.   Goal status: MET    ASSESSMENT:  CLINICAL IMPRESSION: Patient is a 83 y.o. male who was seen today for occupational therapy evaluation for left shoulder pain. Pt with chronic left shoulder pain and cervical neck pain, has seen both OT and PT intermittently since July of 2024. Pt is non-compliant with HEP and plateaued during last episode of care. Pt has maintained ROM since that time, has not gained or decreased. Extensive discussion with pt regarding the importance of maintaining HEP for mobility purposes. Also discussed pain and that with co-morbidities pain relief is not guaranteed. Pt verbalized understanding and plans to complete HEP daily. Completed HEP tasks at evaluation, OT providing verbal cuing for form. One time evaluation only at this time.   PERFORMANCE DEFICITS: in functional skills including ADLs,  IADLs, ROM, strength, pain, and UE functional use  IMPAIRMENTS: are limiting patient from ADLs, IADLs, and leisure.   COMORBIDITIES: may have co-morbidities  that affects occupational performance. Patient will benefit from skilled OT to address above impairments and improve overall function.  MODIFICATION OR ASSISTANCE TO COMPLETE EVALUATION: No modification of tasks or assist necessary to complete an evaluation.  OT OCCUPATIONAL PROFILE AND HISTORY: Problem focused assessment: Including review of records relating to presenting problem.  CLINICAL DECISION MAKING: LOW - limited treatment options, no task modification necessary  REHAB POTENTIAL: Good  EVALUATION COMPLEXITY: Low      PLAN:  OT FREQUENCY: one time visit  OT DURATION: 1 week  PLANNED INTERVENTIONS: 97168 OT Re-evaluation, 97535 self care/ADL training, and patient/family education  RECOMMENDED OTHER SERVICES: None at this time  CONSULTED AND AGREED WITH PLAN OF CARE: Patient  PLAN FOR NEXT SESSION: No further OT services required at this time, pt provided with HEP to complete at home.    Sonny Cory, OTR/L  [phone] 12/03/2023, 2:19 PM

## 2023-12-03 NOTE — Therapy (Signed)
 [facility] Rehabilitation at [facility] [address] [place], [place], [address] Phone: [phone]   Fax:  [phone]  December 03, 2023    No Recipients  Occupational Therapy Discharge Summary   Patient: Derrick Dean MRN: [id number] Date of Birth: May 09, 1940  Diagnosis: Acute pain of left shoulder  Stiffness of left shoulder, not elsewhere classified  Other symptoms and signs involving the musculoskeletal system  No data recorded  The above patient had been seen in Occupational Therapy 6 times of 8 treatments.  The treatment consisted of strengthening, ROM, endurance, and functional mobility.  The patient is: Improved  Discharge Findings: Pt was improving incrementally with ROM and strength, poor follow through of HEP  Functional Status at Discharge: Improving ROM and strength.   Goals Partially Met    Sincerely,  Beuna Bolding Jillyn Nightingale, OT  CC No Recipients  [facility] at [facility] [address] [place], [place], [address] Phone: [phone]   Fax:  [phone]  Patient: Derrick Dean MRN: [id number] Date of Birth: 1941/04/01

## 2023-12-03 NOTE — Patient Instructions (Signed)
 Repeat all exercises 10-15 times, 1-2 times per day.  1) Shoulder Protraction    Begin with elbows by your side, slowly punch straight out in front of you.      2) Shoulder Flexion  Standing:         Begin with arms at your side with thumbs pointed up, slowly raise both arms up and forward towards overhead.               3) Horizontal abduction/adduction  Standing:           Begin with arms straight out in front of you, bring out to the side in at T shape. Keep arms straight entire time.                 4) Internal & External Rotation  Standing:     Stand with elbows at the side and elbows bent 90 degrees. Move your forearms away from your body, then bring back inward toward the body.     5) Shoulder Abduction  Standing:       Slowly move your arms out to the side so that they go overhead, in a jumping jack or snow angel movement.     6) (Home) Extension: Isometric / Bilateral Arm Retraction - Sitting   Facing anchor, hold hands and elbow at shoulder height, with elbow bent.  Pull arms back to squeeze shoulder blades together. Repeat 10-15 times. 1-3 times/day.   7) (Clinic) Extension / Flexion (Assist)   Face anchor, pull arms back, keeping elbow straight, and squeze shoulder blades together. Repeat 10-15 times. 1-3 times/day.   Copyright  VHI. All rights reserved.   8) (Home) Retraction: Row - Bilateral (Anchor)   Facing anchor, arms reaching forward, pull hands toward stomach, keeping elbows bent and at your sides and pinching shoulder blades together. Repeat 10-15 times. 1-3 times/day.   Copyright  VHI. All rights reserved.

## 2023-12-07 ENCOUNTER — Encounter: Payer: Self-pay | Admitting: Radiology

## 2023-12-19 ENCOUNTER — Encounter: Payer: Self-pay | Admitting: Orthopaedic Surgery

## 2023-12-19 ENCOUNTER — Ambulatory Visit (INDEPENDENT_AMBULATORY_CARE_PROVIDER_SITE_OTHER): Admitting: Orthopaedic Surgery

## 2023-12-19 VITALS — BP 143/67 | HR 75 | Ht 70.5 in | Wt 164.0 lb

## 2023-12-19 DIAGNOSIS — G8929 Other chronic pain: Secondary | ICD-10-CM | POA: Diagnosis not present

## 2023-12-19 DIAGNOSIS — M25512 Pain in left shoulder: Secondary | ICD-10-CM

## 2023-12-19 NOTE — Progress Notes (Signed)
 He has no pain and full ROM of the left shoulder today.  He started doing his exercises at home and got better.  Encounter Diagnosis  Name Primary?   Chronic left shoulder pain Yes   I will see prn.  Call if any problem.  Precautions discussed.  Electronically Signed Lemond Stable, MD 9/3/20252:32 PM

## 2024-01-15 ENCOUNTER — Ambulatory Visit
Admission: EM | Admit: 2024-01-15 | Discharge: 2024-01-15 | Disposition: A | Discharge status: Home / Self Care | Attending: Family Medicine | Admitting: Family Medicine

## 2024-01-15 DIAGNOSIS — L299 Pruritus, unspecified: Secondary | ICD-10-CM

## 2024-01-15 MED ORDER — TRIAMCINOLONE ACETONIDE 0.1 % EX CREA: 1.0000 | TOPICAL_CREAM | Freq: Two times a day (BID) | CUTANEOUS | 0 refills | Status: AC | PRN

## 2024-01-15 MED ORDER — HYDROXYZINE HCL 10 MG PO TABS: 10.0000 mg | ORAL_TABLET | Freq: Three times a day (TID) | ORAL | 0 refills | Status: AC | PRN

## 2024-01-15 NOTE — ED Provider Notes (Signed)
 [facility]    CSN: [id number] Arrival date & time: 01/15/24  1400      History   Chief Complaint No chief complaint on file.   HPI Derrick Dean is a 83 y.o. male.   Patient presenting today with 1 week history of diffuse itching all over body after visiting his sister at a senior home where he was told was infested with bedbugs.  He denies notice of any rashes or lesions though states he has bad neck mobility and has been unable to visualize many areas of his body since onset.  Denies any notice of bugs within his home and maintains a very cleanly environment.    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   Cognitive communication deficit 11/20/2023   Persons encountering health services in other specified circumstances 11/20/2023   Presence of intraocular lens 11/20/2023   Vascular dementia (HCC) 11/20/2023   Vascular dementia (HCC) 11/20/2023   Prediabetes 08/18/2023   Insomnia 08/10/2023   Neck pain, bilateral 08/10/2023   Chronic left shoulder pain 08/10/2023   Osteoarthritis of hips, 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   Arthritis of left glenohumeral joint 09/25/2022   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   Other abnormalities of gait and mobility 01/03/2021   Gout 01/03/2021   Posttraumatic stress disorder 01/03/2021   Gastroesophageal reflux disease 01/03/2021   Hyperlipidemia 01/03/2021   Vitamin B12 deficiency 01/03/2021   Shoulder pain 01/03/2021   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    Coronary atherosclerosis 01/03/2021   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   Chronic midline low back pain without sciatica 05/24/2018   Lumbar spondylosis 05/24/2018   Pain syndrome, chronic 06/15/2017   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/15/2017   Sprain of right wrist 10/31/2016   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   S/P ORIF (open reduction internal fixation) fracture 05/05/2016   Closed fracture of right distal radius 04/18/2016   Common cold 09/25/2013   CAD S/P percutaneous coronary angioplasty 09/25/2013   HCV (hepatitis C virus) 05/15/2013   Cholelithiasis 05/15/2013   Abdominal pain, epigastric 05/15/2013   Pancreatitis, acute 05/13/2013   Essential hypertension 05/13/2013   Transaminasemia 05/13/2013   Chest pain 05/12/2013    Past Surgical History:  Procedure Laterality Date   APPENDECTOMY     CARDIAC CATHETERIZATION     7 YRS AGO W/STENT   CHOLECYSTECTOMY     Left shoulder surgery     REVERSE SHOULDER ARTHROPLASTY Left 09/25/2022   Procedure: LEFT REVERSE SHOULDER ARTHROPLASTY;  Surgeon: Onesimo Oneil DELENA, MD;  Location: [facility];  Service: Orthopedics;  Laterality: Left;  RNFA NEEDED   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 Authorizing Provider  hydrOXYzine (ATARAX) 10 MG tablet Take  1 tablet (10 mg total) by mouth 3 (three) times daily as needed. May cause drowsiness 01/15/24  Yes Stuart Vernell Norris, PA-C  triamcinolone cream (KENALOG) 0.1 % Apply 1 Application topically 2 (two) times daily as needed. Avoid use on face or private areas 01/15/24  Yes Stuart Vernell Norris, PA-C  allopurinol  (ZYLOPRIM ) 100 MG tablet Take 3 tablets (300 mg total) by mouth daily. Do not start until after resolution of active gout flare 08/18/23   Bacchus, Meade PEDLAR, FNP  amLODipine  (NORVASC ) 10 MG  tablet Take 10 mg by mouth daily.    [provider]  cholecalciferol (VITAMIN D ) 1000 UNITS tablet Take 1,000 Units by mouth daily.    [provider]  Cholecalciferol 1.25 MG (50000 UT) TABS Take 25 mcg by mouth daily as needed (in the morning w/ breakfast.). 02/14/23   [provider]  gabapentin  (NEURONTIN ) 100 MG capsule Take 1 capsule (100 mg total) by mouth 3 (three) times daily. 10/23/23   Leath-Warren, Etta PARAS, NP  losartan  (COZAAR ) 50 MG tablet Take 50 mg by mouth daily.    [provider]  meloxicam  (MOBIC ) 7.5 MG tablet Take 1 tablet (7.5 mg total) by mouth daily. Take with food 08/28/23   Triplett, Tammy, PA-C  methocarbamol  (ROBAXIN -750) 750 MG tablet Take 1 tablet (750 mg total) by mouth 4 (four) times daily. 08/06/23   Bacchus, Meade PEDLAR, FNP  traZODone  (DESYREL ) 100 MG tablet Take 1 tablet (100 mg total) by mouth at bedtime. 08/06/23   Bacchus, Meade PEDLAR, FNP    Family History Family History  Problem Relation Age of Onset   Coronary artery disease Mother    Thyroid disease Mother    Heart disease Mother    Coronary artery disease Father    Heart disease Father     Social History Social History   Tobacco Use   Smoking status: Former    Current packs/day: 0.00    Types: Cigarettes    Quit date: 09/14/2001    Years since quitting: 22.3   Smokeless tobacco: Never  Vaping Use   Vaping status: Never Used  Substance Use Topics   Alcohol use: Yes    Alcohol/week: 1.0 standard drink of alcohol    Types: 1 Cans of beer per week    Comment: 3x week   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     Allergies   Lisinopril   Review of Systems Review of Systems PER HPI  Physical Exam Triage Vital Signs ED Triage Vitals  Encounter Vitals Group     BP 01/15/24 1408 130/64     Girls Systolic BP Percentile --      Girls Diastolic BP Percentile --      Boys Systolic BP Percentile --      Boys  Diastolic BP Percentile --      Pulse Rate 01/15/24 1408 69     Resp 01/15/24 1408 18     Temp 01/15/24 1408 97.6 F (36.4 C)     Temp Source 01/15/24 1408 Oral     SpO2 01/15/24 1408 95 %     Weight --      Height --      Head Circumference --      Peak Flow --      Pain Score 01/15/24 1410 0     Pain Loc --      Pain Education --  Exclude from Growth Chart --    No data found.  Updated Vital Signs BP 130/64 (BP Location: Right Arm)   Pulse 69   Temp 97.6 F (36.4 C) (Oral)   Resp 18   SpO2 95%   Visual Acuity Right Eye Distance:   Left Eye Distance:   Bilateral Distance:    Right Eye Near:   Left Eye Near:    Bilateral Near:     Physical Exam Vitals and nursing note reviewed.  Constitutional:      Appearance: Normal appearance.  HENT:     Head: Atraumatic.     Mouth/Throat:     Mouth: Mucous membranes are moist.  Eyes:     Extraocular Movements: Extraocular movements intact.     Conjunctiva/sclera: Conjunctivae normal.  Cardiovascular:     Rate and Rhythm: Normal rate.  Pulmonary:     Effort: Pulmonary effort is normal. No respiratory distress.  Musculoskeletal:        General: Normal range of motion.     Cervical back: Normal range of motion and neck supple.  Skin:    General: Skin is warm and dry.     Findings: No rash.  Neurological:     General: No focal deficit present.     Mental Status: He is oriented to person, place, and time.     Motor: No weakness.     Gait: Gait normal.  Psychiatric:        Mood and Affect: Mood normal.        Thought Content: Thought content normal.        Judgment: Judgment normal.    UC Treatments / Results  Labs (all labs ordered are listed, but only abnormal results are displayed) Labs Reviewed - No data to display  EKG   Radiology No results found.  Procedures Procedures (including critical care time)  Medications Ordered in UC Medications - No data to display  Initial Impression / Assessment  and Plan / UC Course  I have reviewed the triage vital signs and the nursing notes.  Pertinent labs & imaging results that were available during my care of the patient were reviewed by me and considered in my medical decision making (see chart for details).     No appreciable evidence of bites, skin lesions, rashes.  Suspect largely psychological but will trial hydroxyzine, triamcinolone as needed and continue monitoring for evidence of infestations.  Final Clinical Impressions(s) / UC Diagnoses   Final diagnoses:  Itching     Discharge Instructions      I have sent in a steroid cream to help topically with the itching though I do not appreciate any rashes or bumps at this time.  I have also sent a medication called hydroxyzine that is good for itching to be used as needed.    ED Prescriptions     Medication Sig Dispense Auth. Provider   triamcinolone cream (KENALOG) 0.1 % Apply 1 Application topically 2 (two) times daily as needed. Avoid use on face or private areas 80 g Stuart Vernell Norris, PA-C   hydrOXYzine (ATARAX) 10 MG tablet Take 1 tablet (10 mg total) by mouth 3 (three) times daily as needed. May cause drowsiness 15 tablet Stuart Vernell Norris, [place]      PDMP not reviewed this encounter.   Stuart Vernell Norris, [place] 01/15/24 [phone]

## 2024-01-15 NOTE — Discharge Instructions (Signed)
 I have sent in a steroid cream to help topically with the itching though I do not appreciate any rashes or bumps at this time.  I have also sent a medication called hydroxyzine that is good for itching to be used as needed.

## 2024-01-15 NOTE — ED Triage Notes (Signed)
 Pt reports after visiting his sister in a senior home and began to itch after seeing bugs. States he itches all over. Has not seen any bugs on him.

## 2024-02-18 ENCOUNTER — Encounter: Payer: Self-pay | Admitting: Radiology

## 2024-02-18 ENCOUNTER — Ambulatory Visit: Admitting: Family Medicine

## 2024-03-08 ENCOUNTER — Encounter (HOSPITAL_COMMUNITY): Payer: Self-pay | Admitting: Emergency Medicine

## 2024-03-08 ENCOUNTER — Emergency Department (HOSPITAL_COMMUNITY)
Admission: EM | Admit: 2024-03-08 | Discharge: 2024-03-08 | Disposition: A | Discharge status: Home / Self Care | Attending: Emergency Medicine | Admitting: Emergency Medicine

## 2024-03-08 ENCOUNTER — Ambulatory Visit: Admission: EM | Admit: 2024-03-08 | Discharge: 2024-03-08 | Disposition: A | Discharge status: Home / Self Care

## 2024-03-08 ENCOUNTER — Other Ambulatory Visit: Payer: Self-pay

## 2024-03-08 ENCOUNTER — Encounter: Payer: Self-pay | Admitting: Emergency Medicine

## 2024-03-08 ENCOUNTER — Emergency Department (HOSPITAL_COMMUNITY)

## 2024-03-08 DIAGNOSIS — S01112A Laceration without foreign body of left eyelid and periocular area, initial encounter: Secondary | ICD-10-CM | POA: Diagnosis not present

## 2024-03-08 DIAGNOSIS — M25512 Pain in left shoulder: Secondary | ICD-10-CM | POA: Diagnosis not present

## 2024-03-08 DIAGNOSIS — Z79899 Other long term (current) drug therapy: Secondary | ICD-10-CM | POA: Diagnosis not present

## 2024-03-08 DIAGNOSIS — R930 Abnormal findings on diagnostic imaging of skull and head, not elsewhere classified: Secondary | ICD-10-CM | POA: Insufficient documentation

## 2024-03-08 DIAGNOSIS — M25532 Pain in left wrist: Secondary | ICD-10-CM | POA: Insufficient documentation

## 2024-03-08 DIAGNOSIS — W19XXXA Unspecified fall, initial encounter: Secondary | ICD-10-CM

## 2024-03-08 DIAGNOSIS — I10 Essential (primary) hypertension: Secondary | ICD-10-CM | POA: Diagnosis not present

## 2024-03-08 DIAGNOSIS — S0990XA Unspecified injury of head, initial encounter: Secondary | ICD-10-CM | POA: Diagnosis not present

## 2024-03-08 DIAGNOSIS — S0993XA Unspecified injury of face, initial encounter: Secondary | ICD-10-CM | POA: Diagnosis present

## 2024-03-08 DIAGNOSIS — W01198A Fall on same level from slipping, tripping and stumbling with subsequent striking against other object, initial encounter: Secondary | ICD-10-CM | POA: Diagnosis not present

## 2024-03-08 DIAGNOSIS — R4020 Unspecified coma: Secondary | ICD-10-CM | POA: Diagnosis not present

## 2024-03-08 LAB — URINALYSIS, ROUTINE W REFLEX MICROSCOPIC
Bacteria, UA: NONE SEEN
Bilirubin Urine: NEGATIVE
Glucose, UA: NEGATIVE mg/dL
Hgb urine dipstick: NEGATIVE
Ketones, ur: NEGATIVE mg/dL
Leukocytes,Ua: NEGATIVE
Nitrite: NEGATIVE
Protein, ur: 30 mg/dL — AB
Specific Gravity, Urine: 1.024 (ref 1.005–1.030)
pH: 5 (ref 5.0–8.0)

## 2024-03-08 LAB — COMPREHENSIVE METABOLIC PANEL WITH GFR
ALT: 14 U/L (ref 0–44)
AST: 21 U/L (ref 15–41)
Albumin: 4 g/dL (ref 3.5–5.0)
Alkaline Phosphatase: 84 U/L (ref 38–126)
Anion gap: 10 (ref 5–15)
BUN: 11 mg/dL (ref 8–23)
CO2: 28 mmol/L (ref 22–32)
Calcium: 9.3 mg/dL (ref 8.9–10.3)
Chloride: 106 mmol/L (ref 98–111)
Creatinine, Ser: 0.95 mg/dL (ref 0.61–1.24)
GFR, Estimated: >60 mL/min (ref >60)
Glucose, Bld: 103 mg/dL — ABNORMAL HIGH (ref 70–99)
Potassium: 4.2 mmol/L (ref 3.5–5.1)
Sodium: 144 mmol/L (ref 135–145)
Total Bilirubin: 0.5 mg/dL (ref 0.0–1.2)
Total Protein: 7.1 g/dL (ref 6.5–8.1)

## 2024-03-08 LAB — CBC WITH DIFFERENTIAL/PLATELET
Abs Immature Granulocytes: 0.02 K/uL (ref 0.00–0.07)
Basophils Absolute: 0 K/uL (ref 0.0–0.1)
Basophils Relative: 0 %
Eosinophils Absolute: 0 K/uL (ref 0.0–0.5)
Eosinophils Relative: 1 %
HCT: 43.9 % (ref 39.0–52.0)
Hemoglobin: 14.7 g/dL (ref 13.0–17.0)
Immature Granulocytes: 0 %
Lymphocytes Relative: 23 %
Lymphs Abs: 1.3 K/uL (ref 0.7–4.0)
MCH: 31.4 pg (ref 26.0–34.0)
MCHC: 33.5 g/dL (ref 30.0–36.0)
MCV: 93.8 fL (ref 80.0–100.0)
Monocytes Absolute: 0.6 K/uL (ref 0.1–1.0)
Monocytes Relative: 10 %
Neutro Abs: 3.7 K/uL (ref 1.7–7.7)
Neutrophils Relative %: 66 %
Platelets: 275 K/uL (ref 150–400)
RBC: 4.68 MIL/uL (ref 4.22–5.81)
RDW: 13.8 % (ref 11.5–15.5)
WBC: 5.6 K/uL (ref 4.0–10.5)
nRBC: 0 % (ref 0.0–0.2)

## 2024-03-08 LAB — TROPONIN T, HIGH SENSITIVITY
Troponin T High Sensitivity: 19 ng/L (ref 0–19)
Troponin T High Sensitivity: 18 ng/L (ref 0–19)

## 2024-03-08 MED ORDER — AMLODIPINE BESYLATE 5 MG PO TABS
10.0000 mg | ORAL_TABLET | Freq: Once | ORAL | Status: AC
  Administered 2024-03-08: 10 mg via ORAL
  Filled 2024-03-08: qty 2

## 2024-03-08 MED ORDER — AMLODIPINE BESYLATE 10 MG PO TABS: 10.0000 mg | ORAL_TABLET | Freq: Every day | ORAL | 0 refills | Status: AC

## 2024-03-08 MED ORDER — LOSARTAN POTASSIUM 25 MG PO TABS
50.0000 mg | ORAL_TABLET | Freq: Every day | ORAL | Status: DC
  Administered 2024-03-08: 50 mg via ORAL
  Filled 2024-03-08: qty 2

## 2024-03-08 MED ORDER — LOSARTAN POTASSIUM 50 MG PO TABS: 50.0000 mg | ORAL_TABLET | Freq: Every day | ORAL | 0 refills | Status: DC

## 2024-03-08 MED ORDER — LOSARTAN POTASSIUM 50 MG PO TABS
50.0000 mg | ORAL_TABLET | Freq: Every day | ORAL | 0 refills | Status: AC
Start: 2024-03-09 — End: ?

## 2024-03-08 MED ORDER — AMLODIPINE BESYLATE 10 MG PO TABS: 10.0000 mg | ORAL_TABLET | Freq: Every day | ORAL | 0 refills | Status: DC

## 2024-03-08 NOTE — ED Provider Notes (Signed)
 [place] EMERGENCY DEPARTMENT AT [facility] Provider Note   CSN: [id number] Arrival date & time: 03/08/24  1346     Patient presents with: Derrick Dean is a 83 y.o. male.  {Add pertinent medical, surgical, social history, OB history to HPI:32947}  Fall  This patient is an 83 year old male, he is treated for hypertension with amlodipine  and losartan , he has not been taking his medications in a couple of weeks.  He presented to the urgent care today after having a fall which she states happened 2 days ago when he was walking in his house, turned a corner and felt like he lost his balance fell to the ground and struck his head and laid on the ground unconscious for an unknown period of time.  He went to the urgent care today because he was having ongoing headache and having some chest pain as well.  The chest pain is intermittent, nonexertional and not present at this time.  No shortness of breath no changes in vision no nausea or vomiting.     Prior to Admission medications   Medication Sig Start Date End Date Taking? Authorizing Provider  allopurinol  (ZYLOPRIM ) 100 MG tablet Take 3 tablets (300 mg total) by mouth daily. Do not start until after resolution of active gout flare 08/18/23   Bacchus, Meade PEDLAR, FNP  amLODipine  (NORVASC ) 10 MG tablet Take 10 mg by mouth daily.    [provider]  cholecalciferol (VITAMIN D ) 1000 UNITS tablet Take 1,000 Units by mouth daily.    [provider]  Cholecalciferol 1.25 MG (50000 UT) TABS Take 25 mcg by mouth daily as needed (in the morning w/ breakfast.). 02/14/23   [provider]  gabapentin  (NEURONTIN ) 100 MG capsule Take 1 capsule (100 mg total) by mouth 3 (three) times daily. 10/23/23   Leath-Warren, Etta PARAS, NP  hydrOXYzine  (ATARAX ) 10 MG tablet Take 1 tablet (10 mg total) by mouth 3 (three) times daily as needed. May cause drowsiness 01/15/24   Stuart Vernell Norris, PA-C  losartan  (COZAAR )  50 MG tablet Take 50 mg by mouth daily.    [provider]  meloxicam  (MOBIC ) 7.5 MG tablet Take 1 tablet (7.5 mg total) by mouth daily. Take with food 08/28/23   Triplett, Tammy, PA-C  methocarbamol  (ROBAXIN -750) 750 MG tablet Take 1 tablet (750 mg total) by mouth 4 (four) times daily. 08/06/23   Bacchus, Meade PEDLAR, FNP  traZODone  (DESYREL ) 100 MG tablet Take 1 tablet (100 mg total) by mouth at bedtime. 08/06/23   Bacchus, Gloria Z, FNP  triamcinolone  cream (KENALOG ) 0.1 % Apply 1 Application topically 2 (two) times daily as needed. Avoid use on face or private areas 01/15/24   Stuart Vernell Norris, PA-C    Allergies: Lisinopril    Review of Systems  All other systems reviewed and are negative.   Updated Vital Signs BP (!) 200/102   Pulse 75   Temp 99 F (37.2 C)   Resp 18   Ht 1.778 m (5' 10)   Wt 74.8 kg   SpO2 98%   BMI 23.68 kg/m   Physical Exam Vitals and nursing note reviewed.  Constitutional:      General: He is not in acute distress.    Appearance: He is well-developed.  HENT:     Head: Normocephalic.     Comments: Small well-approximated laceration to the left periorbital tissues    Mouth/Throat:     Pharynx: No oropharyngeal exudate.  Eyes:     General: No scleral icterus.       Right eye: No discharge.        Left eye: No discharge.     Conjunctiva/sclera: Conjunctivae normal.     Pupils: Pupils are equal, round, and reactive to light.  Neck:     Thyroid: No thyromegaly.     Vascular: No JVD.  Cardiovascular:     Rate and Rhythm: Normal rate and regular rhythm.     Heart sounds: Normal heart sounds. No murmur heard.    No friction rub. No gallop.  Pulmonary:     Effort: Pulmonary effort is normal. No respiratory distress.     Breath sounds: Normal breath sounds. No wheezing or rales.  Chest:     Chest wall: No tenderness.  Abdominal:     General: Bowel sounds are normal. There is no distension.     Palpations: Abdomen is soft. There is no  mass.     Tenderness: There is no abdominal tenderness.  Musculoskeletal:        General: No tenderness. Normal range of motion.     Cervical back: Normal range of motion and neck supple.     Right lower leg: No edema.     Left lower leg: No edema.  Lymphadenopathy:     Cervical: No cervical adenopathy.  Skin:    General: Skin is warm and dry.     Findings: No erythema or rash.  Neurological:     Mental Status: He is alert.     Coordination: Coordination normal.  Psychiatric:        Behavior: Behavior normal.     (all labs ordered are listed, but only abnormal results are displayed) Labs Reviewed - No data to display  EKG: None  Radiology: No results found.  {Document cardiac monitor, telemetry assessment procedure when appropriate:32947} Procedures   Medications Ordered in the ED - No data to display    {Click here for ABCD2, HEART and other calculators REFRESH Note before signing:1}                              Medical Decision Making   This patient presents to the ED for concern of some dizziness, headache and chest pain after having a fall., this involves an extensive number of treatment options, and is a complaint that carries with it a high risk of complications and morbidity.  The differential diagnosis includes could have been hypertensive related, could have been a syncopal event that led to everything, the patient is 200/102 at this time untreated for his chronic blood pressure.   Co morbidities / Chronic conditions that complicate the patient evaluation  Noncompliance with medications   Additional history obtained:  Additional history obtained from EMR External records from outside source obtained and reviewed including medical record, the patient was followed in the clinic for chronic pain, these visits go back monthly, I do not see any recent admissions to the hospital and has had a reverse arthroplasty of his left shoulder   Lab Tests:  I Ordered,  and personally interpreted labs.  The pertinent results include:  ***   Imaging Studies ordered:  I ordered imaging studies including ***  I independently visualized and interpreted imaging which showed *** I agree with the radiologist interpretation   Cardiac Monitoring: / EKG:  The patient was maintained on a cardiac monitor.  I personally viewed and interpreted the  cardiac monitored which showed an underlying rhythm of: ***   Problem List / ED Course / Critical interventions / Medication management  *** I ordered medication including ***   Reevaluation of the patient after these medicines showed that the patient *** I have reviewed the patients home medicines and have made adjustments as needed   Consultations Obtained:  I requested consultation with the ***,  and discussed lab and imaging findings as well as pertinent plan - they recommend: ***   Social Determinants of Health:  ***   Test / Admission - Considered:  ***   {Document critical care time when appropriate  Document review of labs and clinical decision tools ie CHADS2VASC2, etc  Document your independent review of radiology images and any outside records  Document your discussion with family members, caretakers and with consultants  Document social determinants of health affecting pt's care  Document your decision making why or why not admission, treatments were needed:32947:::1}   Final diagnoses:  None    ED Discharge Orders     None

## 2024-03-08 NOTE — ED Notes (Signed)
 MD gave verbal clearance for pt to DC independently, including driving himself home.

## 2024-03-08 NOTE — ED Triage Notes (Addendum)
 Fell 2 days ago.  Pain in left hand, neck, right knee, and rib area on left side and left shoulder area.  Patient has bruising and swelling noted above left eye

## 2024-03-08 NOTE — ED Triage Notes (Signed)
 Pt complains of left eye pain, left wrist. Left shoulder and left ribs. Pt sates he fell Thursday night and pain has been getting worse. Tripped on a rug and passed out and doesn't know how long he was out.  Denies blood thinners.

## 2024-03-08 NOTE — ED Notes (Signed)
 Patient is being discharged from the Urgent Care and sent to the Emergency Department via private vehicle . Per NP, patient is in need of higher level of care due to head injury and loss of consciousness. Patient is aware and verbalizes understanding of plan of care.  Vitals:   03/08/24 1302  BP: (!) 166/91  Pulse: 90  Resp: 18  Temp: 97.8 F (36.6 C)  SpO2: 96%

## 2024-03-08 NOTE — ED Provider Notes (Signed)
 Provider Note MRN:  [id number]  Arrival date & time: 03/08/24    ED Course and Medical Decision Making  Assumed care from Dr Cleotilde at shift change.  See note from prior team for complete details, in brief:  Clinical Course as of 03/08/24 1806  Sat Mar 08, 2024  1459 Handoff BM 83 yo/m Here w/ fall, syncope Was walking down hallway, fall/loc/unknown dt Lac/contusion noted, >24 hrs  Imaging pending Intermittent cp Asymptomatic currently  [SG]  1536 CT head, C-spine and maxillofacial resulted, chronic changes noted, no acute fracture or acute abnormalities [SG]  1715 Pt was able to ambulate around treatment area, reports feeling better [SG]    Clinical Course User Index [SG] Elnor Savant A, DO   Hx htn, untreated presently, bp elevated today. Per med rec was prev prescribed amlodipine , hydralazine, losartan . Reports he stopped taking blood pressure medication about a month ago.  He does not have evidence of htn emergency at this time, end organ damage.  Will restart amlodipine /losartan  as hydral appears to be an old rx (pt unsure), give dose here in the ER.  Patient reports he will follow-up with his PCP Monday for further blood pressure management  6:06 PM: On recheck patient reports that he is feeling back to normal, he is able to ambulate without difficulty.  Has a walker at home that he will use.  Reports he will call his PCP on Monday to arrange follow-up.  I have discussed the diagnosis/risks/treatment options with the patient.  Evaluation and diagnostic testing in the emergency department does not suggest an emergent condition requiring admission or immediate intervention beyond what has been performed at this time.  They will follow up with PCP. We also discussed returning to the ED immediately if new or worsening sx occur. We discussed the sx which are most concerning (e.g., sudden worsening pain, fever, inability to tolerate by mouth, chest pain, syncope) that necessitate immediate  return.    The patient appears reasonably screened and/or stabilized for discharge and I doubt any other medical condition or other [facility] requiring further screening, evaluation, or treatment in the ED at this time prior to discharge.      Procedures  Final Clinical Impressions(s) / ED Diagnoses     ICD-10-CM   1. Poorly-controlled hypertension  I10     2. Fall, initial encounter  W19.[facility]       ED Discharge Orders          Ordered    amLODipine  (NORVASC ) 10 MG tablet  Daily        03/08/24 1806    losartan  (COZAAR ) 50 MG tablet  Daily        03/08/24 1806    amLODipine  (NORVASC ) 10 MG tablet  Daily,   Status:  Discontinued        03/08/24 1805    losartan  (COZAAR ) 50 MG tablet  Daily,   Status:  Discontinued        03/08/24 1805              Discharge Instructions      It was a pleasure caring for you today in the emergency department.  Return to the Emergency Department if you have unusual chest pain, pressure, or discomfort, shortness of breath, nausea, vomiting, burping, heartburn, tingling upper body parts, sweating, cold, clammy skin, or racing heartbeat. Call 911 if you think you are having a heart attack. Take all cardiac medications as prescribed - notify your doctor if you have any side  effects. Follow cardiac diet - avoid fatty & fried foods, don't eat too much red meat, eat lots of fruits & vegetables, and dairy products should be low fat. Please lose weight if you are overweight. Become more active with walking, gardening, or any other activity that gets you to moving.  Please call your PCP on Monday to arrange follow up and further management of your elevated blood pressure   Please return to the emergency department immediately for any new or concerning symptoms, or if you get worse.            Elnor Jayson LABOR, DO 03/08/24 1807

## 2024-03-08 NOTE — ED Provider Notes (Addendum)
 [facility]    CSN: [id number] Arrival date & time: 03/08/24  1252      History   Chief Complaint No chief complaint on file.   HPI Derrick Dean is a 83 y.o. male.   The history is provided by the patient.   Patient presents unaccompanied for complaints of neck pain, right knee pain, left-sided rib cage pain, left shoulder pain, and left hand pain.  Patient states he fell 2 days ago.  Patient states that he lost consciousness, states, I am not sure how long I was out.  Patient now complains of pain to these areas.  He does have a healing laceration over the left eye.  Patient denies chest pain, shortness of breath, dizziness, lightheadedness, blurred vision, slurred speech, nausea, or vomiting.  Patient denies the use of blood thinning medications.  Patient states that he lives alone.  Past Medical History:  Diagnosis Date  . Anemia   . Appendicitis   . Arthritis   . B12 nutritional deficiency    on supplement  . GERD (gastroesophageal reflux disease)   . Gout   . Hepatitis C infection    not treated - pending appointment in april 2015  . History of heart artery stent   . Hypertension   . Presbyopia    wears bifocals  . Trouble in sleeping     Patient Active Problem List   Diagnosis Date Noted  . Cerebrovascular disease, unspecified 11/20/2023  . Chronic gouty arthritis 11/20/2023  . Cognitive communication deficit 11/20/2023  . Persons encountering health services in other specified circumstances 11/20/2023  . Presence of intraocular lens 11/20/2023  . Vascular dementia (HCC) 11/20/2023  . Vascular dementia (HCC) 11/20/2023  . Prediabetes 08/18/2023  . Insomnia 08/10/2023  . Neck pain, bilateral 08/10/2023  . Chronic left shoulder pain 08/10/2023  . 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 Arthritis of left glenohumeral joint 09/25/2022  . 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 Other abnormalities of gait and mobility  01/03/2021  . Gout 01/03/2021  . Posttraumatic stress disorder 01/03/2021  . Gastroesophageal reflux disease 01/03/2021  . Hyperlipidemia 01/03/2021  . Vitamin B12 deficiency 01/03/2021  . Shoulder pain 01/03/2021  . 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 Coronary atherosclerosis 01/03/2021  . 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  . Chronic midline low back pain without sciatica 05/24/2018  . Lumbar spondylosis 05/24/2018  . Pain syndrome, chronic 06/15/2017  . 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/15/2017  . Sprain of right wrist 10/31/2016  . 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  . S/P ORIF (open reduction internal fixation) fracture 05/05/2016  . Closed fracture of right distal radius 04/18/2016  . Common cold 09/25/2013  . CAD S/P percutaneous coronary angioplasty 09/25/2013  . HCV (hepatitis C virus) 05/15/2013  . Cholelithiasis 05/15/2013  . Abdominal pain, epigastric 05/15/2013  . Pancreatitis, acute 05/13/2013  . Essential hypertension 05/13/2013  . Transaminasemia 05/13/2013  . Chest pain 05/12/2013    Past Surgical History:  Procedure Laterality Date  . APPENDECTOMY    . CARDIAC CATHETERIZATION     7 YRS AGO W/STENT  . CHOLECYSTECTOMY    . Left shoulder surgery    . REVERSE SHOULDER ARTHROPLASTY Left 09/25/2022   Procedure: LEFT REVERSE SHOULDER ARTHROPLASTY;  Surgeon: Onesimo Oneil DELENA, MD;  Location: [facility];  Service: Orthopedics;  Laterality: Left;  RNFA NEEDED  . 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 Right Shoulder surgery    . TOTAL KNEE ARTHROPLASTY Bilateral  Home Medications    Prior to Admission medications   Medication Sig Start Date End Date Taking? Authorizing Provider  allopurinol  (ZYLOPRIM ) 100 MG tablet Take 3 tablets (300 mg total) by mouth daily. Do not start until after resolution of active gout flare 08/18/23   Bacchus, Meade PEDLAR, FNP  amLODipine  (NORVASC ) 10 MG tablet Take 10 mg by mouth daily.    [provider]  cholecalciferol (VITAMIN D ) 1000 UNITS tablet Take 1,000 Units by mouth daily.    [provider]  Cholecalciferol 1.25 MG (50000 UT) TABS Take 25 mcg by mouth daily as needed (in the morning w/ breakfast.). 02/14/23   [provider]  gabapentin  (NEURONTIN ) 100 MG capsule Take 1 capsule (100 mg total) by mouth 3 (three) times daily. 10/23/23   Leath-Warren, Etta PARAS, NP  hydrOXYzine  (ATARAX ) 10 MG tablet Take 1 tablet (10 mg total) by mouth 3 (three) times daily as needed. May cause drowsiness 01/15/24   Stuart Vernell Norris, PA-C  losartan  (COZAAR ) 50 MG tablet Take 50 mg by mouth daily.    [provider]  meloxicam  (MOBIC ) 7.5 MG tablet Take 1 tablet (7.5 mg total) by mouth daily. Take with food 08/28/23   Triplett, Tammy, PA-C  methocarbamol  (ROBAXIN -750) 750 MG tablet Take 1 tablet (750 mg total) by mouth 4 (four) times daily. 08/06/23   Bacchus, Meade PEDLAR, FNP  traZODone  (DESYREL ) 100 MG tablet Take 1 tablet (100 mg total) by mouth at bedtime. 08/06/23   Bacchus, Gloria Z, FNP  triamcinolone  cream (KENALOG ) 0.1 % Apply 1 Application topically 2 (two) times daily as needed. Avoid use on face or private areas 01/15/24   Stuart Vernell Norris, PA-C    Family History Family History  Problem Relation Age of Onset  . Coronary artery disease Mother   . Thyroid disease Mother   . Heart disease Mother   . Coronary artery disease Father   . Heart disease Father     Social History Social History   Tobacco Use  . Smoking status: Former    Current packs/day: 0.00    Types: Cigarettes    Quit date: 09/14/2001    Years since quitting: 22.4  . Smokeless tobacco: Never  Vaping Use  . Vaping status: Never Used  Substance Use Topics  . Alcohol use: Yes    Alcohol/week: 1.0 standard drink of alcohol    Types: 1 Cans of beer per week    Comment: 3x week  . 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      Allergies   Lisinopril   Review of Systems Review of Systems Per HPI  Physical Exam Triage Vital Signs ED Triage Vitals  Encounter Vitals Group     BP 03/08/24 1302 (!) 166/91     Girls Systolic BP Percentile --      Girls Diastolic BP Percentile --      Boys Systolic BP Percentile --      Boys Diastolic BP Percentile --      Pulse Rate 03/08/24 1302 90     Resp 03/08/24 1302 18     Temp 03/08/24 1302 97.8 F (36.6 C)     Temp Source 03/08/24 1302 Oral     SpO2 03/08/24 1302 96 %     Weight --      Height --      Head Circumference --      Peak Flow --  Pain Score 03/08/24 1304 6     Pain Loc --      Pain Education --      Exclude from Growth Chart --    No data found.  Updated Vital Signs BP (!) 166/91 (BP Location: Right Arm)   Pulse 90   Temp 97.8 F (36.6 C) (Oral)   Resp 18   SpO2 96%   Visual Acuity Right Eye Distance:   Left Eye Distance:   Bilateral Distance:    Right Eye Near:   Left Eye Near:    Bilateral Near:     Physical Exam Vitals and nursing note reviewed.  Constitutional:      General: He is not in acute distress.    Appearance: Normal appearance.  HENT:     Head: Normocephalic.  Eyes:     Extraocular Movements: Extraocular movements intact.     Pupils: Pupils are equal, round, and reactive to light.  Cardiovascular:     Rate and Rhythm: Normal rate and regular rhythm.     Pulses: Normal pulses.     Heart sounds: Normal heart sounds.  Pulmonary:     Effort: Pulmonary effort is normal. No respiratory distress.     Breath sounds: Normal breath sounds. No stridor. No wheezing, rhonchi or rales.  Chest:     Chest wall: Tenderness present.    Abdominal:     General: Bowel sounds are normal.     Palpations: Abdomen is soft.     Tenderness: There is no abdominal tenderness.  Musculoskeletal:     Cervical back: Pain with movement and muscular tenderness present.  Skin:    General: Skin is warm and dry.     Findings:  Laceration present.      Neurological:     General: No focal deficit present.     Mental Status: He is alert and oriented to person, place, and time.     GCS: GCS eye subscore is 4. GCS verbal subscore is 5. GCS motor subscore is 6.     Cranial Nerves: Cranial nerves 2-12 are intact.     Coordination: Coordination is intact.     Gait: Gait is intact.  Psychiatric:        Mood and Affect: Mood normal.        Behavior: Behavior normal.      UC Treatments / Results  Labs (all labs ordered are listed, but only abnormal results are displayed) Labs Reviewed - No data to display  EKG   Radiology No results found.  Procedures Procedures (including critical care time)  Medications Ordered in UC Medications - No data to display  Initial Impression / Assessment and Plan / UC Course  I have reviewed the triage vital signs and the nursing notes.  Pertinent labs & imaging results that were available during my care of the patient were reviewed by me and considered in my medical decision making (see chart for details).  Patient presents for complaints of neck pain, left shoulder pain, left-sided chest wall pain, left knee pain, and back pain after he fell approximately 2 days ago.  Patient reports loss of consciousness, states I am unsure of how long I was out.  Patient also has a healing laceration noted above the left eyebrow.  Per review of chart, patient is currently not on any blood thinning medications.  Given the patient's nature of complaints, his age, loss of consciousness for an unestablished amount of time, recommend further evaluation in the emergency  department.  The patient's vital signs are stable, neurological exam is intact at this time.  Patient is unaccompanied, gait is stable, he is able to travel to the emergency department via private vehicle.  Patient was in agreement with this plan of care and verbalizes understanding.  All questions were answered.  Patient discharged  to the emergency department.  Patient ambulatory at discharge.  Final Clinical Impressions(s) / UC Diagnoses   Final diagnoses:  Injury of head, initial encounter  Loss of consciousness [facility])   Discharge Instructions   None    ED Prescriptions   None    PDMP not reviewed this encounter.   Gilmer Etta PARAS, NP 03/08/24 1326    Gilmer Etta PARAS, NP 03/08/24 1404

## 2024-03-08 NOTE — Discharge Instructions (Addendum)
 It was a pleasure caring for you today in the emergency department.  Return to the Emergency Department if you have unusual chest pain, pressure, or discomfort, shortness of breath, nausea, vomiting, burping, heartburn, tingling upper body parts, sweating, cold, clammy skin, or racing heartbeat. Call 911 if you think you are having a heart attack. Take all cardiac medications as prescribed - notify your doctor if you have any side effects. Follow cardiac diet - avoid fatty & fried foods, don't eat too much red meat, eat lots of fruits & vegetables, and dairy products should be low fat. Please lose weight if you are overweight. Become more active with walking, gardening, or any other activity that gets you to moving.  Please call your PCP on Monday to arrange follow up and further management of your elevated blood pressure   Please return to the emergency department immediately for any new or concerning symptoms, or if you get worse.

## 2024-05-13 ENCOUNTER — Encounter (HOSPITAL_COMMUNITY): Payer: Self-pay | Admitting: *Deleted

## 2024-05-13 ENCOUNTER — Emergency Department (HOSPITAL_COMMUNITY)

## 2024-05-13 ENCOUNTER — Emergency Department (HOSPITAL_COMMUNITY)
Admission: EM | Admit: 2024-05-13 | Discharge: 2024-05-13 | Disposition: A | Discharge status: Home / Self Care | Attending: Emergency Medicine | Admitting: Emergency Medicine

## 2024-05-13 ENCOUNTER — Other Ambulatory Visit: Payer: Self-pay

## 2024-05-13 DIAGNOSIS — M7918 Myalgia, other site: Secondary | ICD-10-CM

## 2024-05-13 DIAGNOSIS — R079 Chest pain, unspecified: Secondary | ICD-10-CM | POA: Insufficient documentation

## 2024-05-13 DIAGNOSIS — M545 Low back pain, unspecified: Secondary | ICD-10-CM | POA: Insufficient documentation

## 2024-05-13 MED ORDER — OXYCODONE-ACETAMINOPHEN 5-325 MG PO TABS: 0.5000 | ORAL_TABLET | Freq: Four times a day (QID) | ORAL | 0 refills | Status: DC | PRN

## 2024-05-13 MED ORDER — OXYCODONE-ACETAMINOPHEN 5-325 MG PO TABS
1.0000 | ORAL_TABLET | Freq: Once | ORAL | Status: AC
  Administered 2024-05-13: 1 via ORAL
  Filled 2024-05-13: qty 1

## 2024-05-13 NOTE — ED Notes (Signed)
 Pt returned from imaging.

## 2024-05-13 NOTE — ED Notes (Signed)
 Pt reports that his ride should be here at 3pm.

## 2024-05-13 NOTE — ED Notes (Signed)
"  Pt in imaging  "

## 2024-05-13 NOTE — ED Notes (Signed)
 Informed patient that someone has to come pick him up. Pt asked if he could take a taxi. Explained to patient that his chart has an advisory that he has a history of dementia so we cannot let him leave via taxi nor can he wait in the lobby. Explained to patient that whoever picks him up need to come inside for the nurse to give discharge instructions to and make sure patient has a safe way home.

## 2024-05-13 NOTE — Discharge Instructions (Signed)
 Your x-rays and CT scans are negative for any internal injury from your fall, I suspect your symptoms are secondary to deep tissue bruising which should improve with time.  I have prescribed you pain medication which would be okay to take with your other current medications, however use this medication sparingly as it can make you drowsy, do not drive within 4 hours of taking it.  Additionally, it can also cause constipation, you may want to consider taking a stool softener while on this medication.  I recommend applying a heating pad for 20 minutes 3-4 times daily to your lower back and to your ribs.  Plan to see your doctor for recheck in 1 week if not significantly improved.

## 2024-05-13 NOTE — ED Provider Notes (Signed)
 " [place] EMERGENCY DEPARTMENT AT [facility] Provider Note   CSN: [id number] Arrival date & time: 05/13/24  1027     Patient presents with: Back Pain   Derrick Dean is a 84 y.o. male with a history including hypertension, GERD, history of hepatitis C, dementia, presenting for evaluation of a fall which occurred 3 to 4 days ago.  He presents with his caregiver who is also his granddaughter.  He reports a fall when he was making a snack before bedtime several nights ago, he suspects he tripped over a rug causing the fall but states he does not remember immediately after the event, but denies hitting his head, denies headache, vision changes, nausea or vomiting since.  He does endorse pain along his left chest, stating he hit a cabinet during the fall, he also endorses some midline low back pain but denies weakness or numbness radiating into his extremities.  Denies dizziness.   The history is provided by the patient.       Prior to Admission medications  Medication Sig Start Date End Date Taking? Authorizing Provider  oxyCODONE -acetaminophen  (PERCOCET/ROXICET) 5-325 MG tablet Take 0.5-1 tablets by mouth every 6 (six) hours as needed for severe pain (pain score 7-10). 05/13/24  Yes Normagene Harvie, PA-C  allopurinol  (ZYLOPRIM ) 100 MG tablet Take 3 tablets (300 mg total) by mouth daily. Do not start until after resolution of active gout flare 08/18/23   Bacchus, Meade PEDLAR, FNP  amLODipine  (NORVASC ) 10 MG tablet Take 1 tablet (10 mg total) by mouth daily. 03/09/24   Elnor Jayson LABOR, DO  cholecalciferol (VITAMIN D ) 1000 UNITS tablet Take 1,000 Units by mouth daily.    [provider]  Cholecalciferol 1.25 MG (50000 UT) TABS Take 25 mcg by mouth daily as needed (in the morning w/ breakfast.). 02/14/23   [provider]  gabapentin  (NEURONTIN ) 100 MG capsule Take 1 capsule (100 mg total) by mouth 3 (three) times daily. 10/23/23   Leath-Warren, Etta PARAS, NP  hydrOXYzine   (ATARAX ) 10 MG tablet Take 1 tablet (10 mg total) by mouth 3 (three) times daily as needed. May cause drowsiness 01/15/24   Stuart Vernell Norris, PA-C  losartan  (COZAAR ) 50 MG tablet Take 1 tablet (50 mg total) by mouth daily. 03/09/24   Elnor Jayson LABOR, DO  meloxicam  (MOBIC ) 7.5 MG tablet Take 1 tablet (7.5 mg total) by mouth daily. Take with food 08/28/23   Triplett, Tammy, PA-C  methocarbamol  (ROBAXIN -750) 750 MG tablet Take 1 tablet (750 mg total) by mouth 4 (four) times daily. 08/06/23   Bacchus, Meade PEDLAR, FNP  traZODone  (DESYREL ) 100 MG tablet Take 1 tablet (100 mg total) by mouth at bedtime. 08/06/23   Bacchus, Gloria Z, FNP  triamcinolone  cream (KENALOG ) 0.1 % Apply 1 Application topically 2 (two) times daily as needed. Avoid use on face or private areas 01/15/24   Stuart Vernell Norris, PA-C    Allergies: Lisinopril    Review of Systems  Constitutional:  Negative for fever.  HENT:  Negative for congestion and sore throat.   Eyes: Negative.   Respiratory:  Negative for chest tightness and shortness of breath.   Cardiovascular:  Positive for chest pain.  Gastrointestinal:  Negative for abdominal pain and nausea.  Genitourinary: Negative.   Musculoskeletal:  Positive for back pain. Negative for arthralgias, joint swelling and neck pain.  Skin: Negative.  Negative for rash and wound.  Neurological:  Negative for dizziness, weakness, light-headedness, numbness and headaches.  Psychiatric/Behavioral: Negative.  Updated Vital Signs BP (!) 175/76 (BP Location: Right Arm)   Pulse 78   Temp 97.9 F (36.6 C) (Oral)   Resp 20   Ht 5' 10 (1.778 m)   Wt 77.1 kg   SpO2 96%   BMI 24.39 kg/m   Physical Exam Vitals and nursing note reviewed.  Constitutional:      Appearance: He is well-developed.  HENT:     Head: Normocephalic and atraumatic.  Eyes:     Extraocular Movements: Extraocular movements intact.     Conjunctiva/sclera: Conjunctivae normal.     Pupils: Pupils are equal,  round, and reactive to light.  Cardiovascular:     Rate and Rhythm: Normal rate and regular rhythm.     Heart sounds: Normal heart sounds.  Pulmonary:     Effort: Pulmonary effort is normal.     Breath sounds: Normal breath sounds. No wheezing.  Abdominal:     General: Bowel sounds are normal.     Palpations: Abdomen is soft.     Tenderness: There is no abdominal tenderness.  Musculoskeletal:        General: Normal range of motion.     Cervical back: Normal range of motion and neck supple. No tenderness.  Skin:    General: Skin is warm and dry.  Neurological:     General: No focal deficit present.     Mental Status: He is alert and oriented to person, place, and time.     (all labs ordered are listed, but only abnormal results are displayed) Labs Reviewed - No data to display  EKG: None  Radiology: DG Lumbar Spine Complete Result Date: 05/13/2024 CLINICAL DATA:  Fall with back pain. EXAM: LUMBAR SPINE - COMPLETE 4+ VIEW COMPARISON:  [id number] FINDINGS: There is no acute evidence of lumbar spine fracture or subluxation. Stable advanced degenerative disc disease throughout the lumbar spine, more significantly at the L3-4, L4-5 and L5-S1 levels. No bony lesions identified. Stable visualized calcified plaque in the abdominal aorta. IMPRESSION: No acute findings. Stable advanced degenerative disc disease throughout the lumbar spine. Electronically Signed   By: Marcey Moan M.D.   On: 05/13/2024 13:16   DG Ribs Unilateral W/Chest Left Result Date: 05/13/2024 CLINICAL DATA:  Chest wall pain after a fall. EXAM: LEFT RIBS AND CHEST - 3+ VIEW COMPARISON:  CT of 03/01/2023 FINDINGS: 2 frontal views of the chest and 4 images of left-sided ribs. Frontal view of the chest demonstrates mild right hemidiaphragm elevation. Cholecystectomy clips. Left shoulder arthroplasty. Midline trachea. Mild cardiomegaly. Atherosclerosis in the transverse aorta. No pleural effusion or pneumothorax. Progressive  interstitial lung disease, as evidenced by basilar predominant interstitial thickening. No lobar consolidation. Rib radiographs demonstrate no displaced fracture. IMPRESSION: No displaced rib fracture, pleural fluid, or pneumothorax. Progressive interstitial lung disease since 2024. 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 Aortic Atherosclerosis (ICD10-I70.0). Electronically Signed   By: Rockey Kilts M.D.   On: 05/13/2024 13:15   CT Head Wo Contrast Result Date: 05/13/2024 EXAM: CT HEAD WITHOUT CONTRAST 05/13/2024 12:41:00 PM TECHNIQUE: CT of the head was performed without the administration of intravenous contrast. Automated exposure control, iterative reconstruction, and/or weight based adjustment of the mA/kV was utilized to reduce the radiation dose to as low as reasonably achievable. COMPARISON: 03/08/2024 CLINICAL HISTORY: Head trauma, minor (Age >= 65 years). FINDINGS: BRAIN AND VENTRICLES: No acute hemorrhage. No evidence of acute infarct. No hydrocephalus. No extra-axial collection. No mass effect or midline shift. There is mild generalized parenchymal volume loss.  Moderate chronic microvascular ischemic changes similar to prior. Atherosclerosis of the carotid siphons. ORBITS: Right lens replacement. Abnormal soft tissue in the posterior aspect of the right orbit extending to the orbital apex is unchanged since at least 2021 and may reflect sequelae of prior trauma. SINUSES: No acute abnormality. SOFT TISSUES AND SKULL: No acute soft tissue abnormality. No skull fracture. IMPRESSION: 1. No acute intracranial abnormality. Electronically signed by: Donnice Mania MD 05/13/2024 01:04 PM EST RP Workstation: [id number]     Procedures   Medications Ordered in the ED  oxyCODONE -acetaminophen  (PERCOCET/ROXICET) 5-325 MG per tablet 1 tablet (has no administration in time range)                                    Medical Decision Making Patient presenting for multiple complaints of pain,  predominately localizing to the left chest wall after a fall occurring 3 to 4 days ago.  He has no neurodeficits on exam, he denies hitting his head and reports remembering the fall but is unclear of his memory immediately after, raising the question of perhaps he did hit his head although he has no exam findings suggesting trauma to the skull or scalp.  Imaging as outlined below is reassuring.  He has been taking Tylenol  at home without symptom relief, he is also on a muscle relaxer and an NSAID through his [place] office, also not effective.  He was given a small course of oxycodone , advised 1/2 to 1 tablet every 6 hours for severe pain, caution given regarding sedation and constipation.  He was advised close follow-up with his primary provider if symptoms are not completely resolved over the next 10 days.  Also discussed role of ice and heat to areas of injury.  Amount and/or Complexity of Data Reviewed Radiology: ordered.    Details: CT head negative acute intracranial injury, chest x-ray with left rib detail and lumbar spine also negative for acute injury.  Risk Prescription drug management.        Final diagnoses:  Musculoskeletal pain    ED Discharge Orders          Ordered    oxyCODONE -acetaminophen  (PERCOCET/ROXICET) 5-325 MG tablet  Every 6 hours PRN        05/13/24 1337               Ruchy Wildrick, PA-C 05/13/24 1545  "

## 2024-05-13 NOTE — ED Triage Notes (Signed)
 Pt c/o fall x 6 days ago; pt c/o back pain from the fall; pt denies any blood thinners

## 2024-05-13 NOTE — ED Notes (Signed)
 Followed up with patient. He states his ride is on the way.

## 2024-05-22 ENCOUNTER — Ambulatory Visit
Admission: EM | Admit: 2024-05-22 | Discharge: 2024-05-22 | Disposition: A | Discharge status: Home / Self Care | Source: Home / Self Care

## 2024-05-22 DIAGNOSIS — R0789 Other chest pain: Secondary | ICD-10-CM

## 2024-05-22 DIAGNOSIS — W19XXXD Unspecified fall, subsequent encounter: Secondary | ICD-10-CM

## 2024-05-22 DIAGNOSIS — M545 Low back pain, unspecified: Secondary | ICD-10-CM

## 2024-05-22 MED ORDER — PREDNISONE 20 MG PO TABS: 40.0000 mg | ORAL_TABLET | Freq: Every day | ORAL | 0 refills | Status: AC

## 2024-05-22 MED ORDER — LIDOCAINE 5 % EX PTCH: 1.0000 | MEDICATED_PATCH | CUTANEOUS | 0 refills | Status: AC

## 2024-05-22 NOTE — ED Triage Notes (Signed)
 Pt reports he has some residual back pain from a fall x 3 weeks  Complains of left side rib pain and shoulder pain

## 2024-05-22 NOTE — ED Provider Notes (Signed)
 " [facility]    CSN: [id number] Arrival date & time: 05/22/24  1759      History   Chief Complaint No chief complaint on file.   HPI Derrick Dean is a 84 y.o. male.   Patient presents with continued low back pain and left-sided chest wall pain after fall that occurred approximately 3 weeks ago.  Patient was seen in the ER on 1/27 and had imaging done at that time that did not reveal any underlying fractures related to his fall.  Patient reports that he was prescribed oxycodone  at that time which he ran out of, but states that it this did not seem to provide him that much relief when he was taking it.  Patient denies any new falls or known injuries.  Patient also denies any difficulty breathing or shortness of breath.  Patient denies any numbness, tingling, or weakness that radiates down his legs.  Denies saddle anesthesia or bowel/bladder incontinence.  The history is provided by the patient and medical records.    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   Cognitive communication deficit 11/20/2023   Persons encountering health services in other specified circumstances 11/20/2023   Presence of intraocular lens 11/20/2023   Vascular dementia (HCC) 11/20/2023   Vascular dementia (HCC) 11/20/2023   Prediabetes 08/18/2023   Insomnia 08/10/2023   Neck pain, bilateral 08/10/2023   Chronic left shoulder pain 08/10/2023   Osteoarthritis of hips, 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   Arthritis of left glenohumeral joint  09/25/2022   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   Other abnormalities of gait and mobility 01/03/2021   Gout 01/03/2021   Posttraumatic stress disorder 01/03/2021   Gastroesophageal reflux disease 01/03/2021   Hyperlipidemia 01/03/2021   Vitamin B12 deficiency 01/03/2021   Shoulder pain 01/03/2021   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   Coronary atherosclerosis 01/03/2021   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   Chronic midline low back pain without sciatica 05/24/2018   Lumbar spondylosis 05/24/2018   Pain syndrome, chronic 06/15/2017   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/15/2017   Sprain of right wrist 10/31/2016   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   S/P ORIF (open reduction internal fixation) fracture 05/05/2016   Closed fracture of right distal radius 04/18/2016   Common cold 09/25/2013   CAD S/P percutaneous coronary angioplasty 09/25/2013   HCV (hepatitis C virus) 05/15/2013   Cholelithiasis 05/15/2013   Abdominal pain, epigastric 05/15/2013   Pancreatitis, acute 05/13/2013   Essential hypertension 05/13/2013   Transaminasemia 05/13/2013   Chest pain 05/12/2013    Past Surgical History:  Procedure Laterality Date   APPENDECTOMY     CARDIAC CATHETERIZATION     7 YRS AGO W/STENT   CHOLECYSTECTOMY     Left shoulder surgery     REVERSE SHOULDER ARTHROPLASTY Left 09/25/2022   Procedure: LEFT REVERSE SHOULDER ARTHROPLASTY;  Surgeon: Onesimo Oneil DELENA, MD;  Location: [facility];  Service:  Orthopedics;  Laterality: Left;  RNFA NEEDED   Right Hip Replacement Right 01/27/2020   Right Shoulder surgery     TOTAL KNEE ARTHROPLASTY Bilateral        Home Medications    Prior to Admission medications  Medication Sig Start Date End Date Taking? Authorizing Provider  lidocaine  (LIDODERM ) 5 % Place 1 patch onto the skin daily. Remove & Discard patch within 12 hours or as directed by MD 05/22/24  Yes Johnie Flaming A, NP  predniSONE  (DELTASONE ) 20 MG  tablet Take 2 tablets (40 mg total) by mouth daily for 5 days. 05/22/24 05/27/24 Yes Besse Miron A, NP  allopurinol  (ZYLOPRIM ) 100 MG tablet Take 3 tablets (300 mg total) by mouth daily. Do not start until after resolution of active gout flare 08/18/23   Bacchus, Meade PEDLAR, FNP  amLODipine  (NORVASC ) 10 MG tablet Take 1 tablet (10 mg total) by mouth daily. 03/09/24   Elnor Jayson LABOR, DO  cholecalciferol (VITAMIN D ) 1000 UNITS tablet Take 1,000 Units by mouth daily.    [provider]  Cholecalciferol 1.25 MG (50000 UT) TABS Take 25 mcg by mouth daily as needed (in the morning w/ breakfast.). 02/14/23   [provider]  gabapentin  (NEURONTIN ) 100 MG capsule Take 1 capsule (100 mg total) by mouth 3 (three) times daily. 10/23/23   Leath-Warren, Etta PARAS, NP  hydrOXYzine  (ATARAX ) 10 MG tablet Take 1 tablet (10 mg total) by mouth 3 (three) times daily as needed. May cause drowsiness 01/15/24   Stuart Vernell Norris, PA-C  losartan  (COZAAR ) 50 MG tablet Take 1 tablet (50 mg total) by mouth daily. 03/09/24   Elnor Jayson LABOR, DO  meloxicam  (MOBIC ) 7.5 MG tablet Take 1 tablet (7.5 mg total) by mouth daily. Take with food 08/28/23   Triplett, Tammy, PA-C  methocarbamol  (ROBAXIN -750) 750 MG tablet Take 1 tablet (750 mg total) by mouth 4 (four) times daily. 08/06/23   Bacchus, Meade PEDLAR, FNP  traZODone  (DESYREL ) 100 MG tablet Take 1 tablet (100 mg total) by mouth at bedtime. 08/06/23   Bacchus, Gloria Z, FNP  triamcinolone  cream (KENALOG ) 0.1 % Apply 1 Application topically 2 (two) times daily as needed. Avoid use on face or private areas 01/15/24   Stuart Vernell Norris, PA-C    Family History Family History  Problem Relation Age of Onset   Coronary artery disease Mother    Thyroid disease Mother    Heart disease Mother    Coronary artery disease Father    Heart disease Father     Social History Social History[1]   Allergies   Lisinopril   Review of Systems Review of Systems  Per  HPI  Physical Exam Triage Vital Signs ED Triage Vitals  Encounter Vitals Group     BP 05/22/24 1814 135/73     Girls Systolic BP Percentile --      Girls Diastolic BP Percentile --      Boys Systolic BP Percentile --      Boys Diastolic BP Percentile --      Pulse Rate 05/22/24 1814 75     Resp 05/22/24 1814 18     Temp 05/22/24 1814 97.8 F (36.6 C)     Temp Source 05/22/24 1814 Oral     SpO2 05/22/24 1814 95 %     Weight --      Height --      Head Circumference --      Peak Flow --      Pain  Score 05/22/24 1813 6     Pain Loc --      Pain Education --      Exclude from Growth Chart --    No data found.  Updated Vital Signs BP 135/73 (BP Location: Right Arm)   Pulse 75   Temp 97.8 F (36.6 C) (Oral)   Resp 18   SpO2 95%   Visual Acuity Right Eye Distance:   Left Eye Distance:   Bilateral Distance:    Right Eye Near:   Left Eye Near:    Bilateral Near:     Physical Exam Vitals and nursing note reviewed.  Constitutional:      General: He is awake. He is not in acute distress.    Appearance: Normal appearance. He is well-developed and well-groomed. He is not ill-appearing.  Cardiovascular:     Rate and Rhythm: Normal rate and regular rhythm.  Pulmonary:     Effort: Pulmonary effort is normal.     Breath sounds: Normal breath sounds.  Chest:     Chest wall: Tenderness present. No mass, deformity or swelling.       Comments: Tenderness noted to left lateral chest wall without bruising, swelling, erythema, or obvious deformity. Musculoskeletal:     Cervical back: Normal.     Thoracic back: Normal.     Lumbar back: Tenderness present. No swelling, edema, deformity, signs of trauma or bony tenderness. Normal range of motion. Negative right straight leg raise test and negative left straight leg raise test.       Back:     Comments: Tenderness noted to generalized low back without spinous process tenderness.  Skin:    General: Skin is warm and dry.   Neurological:     General: No focal deficit present.     Mental Status: He is alert and oriented to person, place, and time. Mental status is at baseline.  Psychiatric:        Behavior: Behavior is cooperative.      UC Treatments / Results  Labs (all labs ordered are listed, but only abnormal results are displayed) Labs Reviewed - No data to display  EKG   Radiology No results found.  Procedures Procedures (including critical care time)  Medications Ordered in UC Medications - No data to display  Initial Impression / Assessment and Plan / UC Course  I have reviewed the triage vital signs and the nursing notes.  Pertinent labs & imaging results that were available during my care of the patient were reviewed by me and considered in my medical decision making (see chart for details).     Patient is overall well-appearing.  Vitals are stable.  Back pain likely muscular in nature.  Suspect chest wall pain could be related to underlying rib contusion.  Prescribed prednisone  to help with inflammation related to pain.  Prescribed lidocaine  patches for additional pain relief.  Given orthopedic follow-up if needed.  Discussed follow-up and return precautions. Final Clinical Impressions(s) / UC Diagnoses   Final diagnoses:  Acute bilateral low back pain without sciatica  Left-sided chest wall pain  Fall, subsequent encounter     Discharge Instructions      As discussed I believe your back pain is likely muscular in nature from your fall since you previously did imaging of this without any fractures. I suspect that your chest wall pain could be related to a rib contusion as you also had previous x-rays of your ribs and did not reveal any fractures at  that time either. Start taking 2 tablets of prednisone  once daily for 5 days to help with inflammation related to your pain. You can apply lidocaine  patch for 12 hours at a time for additional pain relief. Otherwise alternate  between ice and heat as needed for pain. Follow-up with [facility] if your pain continues for further evaluation. Otherwise follow-up with your primary care provider or return here as needed.   ED Prescriptions     Medication Sig Dispense Auth. Provider   predniSONE  (DELTASONE ) 20 MG tablet Take 2 tablets (40 mg total) by mouth daily for 5 days. 10 tablet Johnie Flaming A, NP   lidocaine  (LIDODERM ) 5 % Place 1 patch onto the skin daily. Remove & Discard patch within 12 hours or as directed by MD 30 patch Johnie Flaming LABOR, NP      PDMP not reviewed this encounter.    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

## 2024-05-22 NOTE — Discharge Instructions (Signed)
 As discussed I believe your back pain is likely muscular in nature from your fall since you previously did imaging of this without any fractures. I suspect that your chest wall pain could be related to a rib contusion as you also had previous x-rays of your ribs and did not reveal any fractures at that time either. Start taking 2 tablets of prednisone  once daily for 5 days to help with inflammation related to your pain. You can apply lidocaine  patch for 12 hours at a time for additional pain relief. Otherwise alternate between ice and heat as needed for pain. Follow-up with [facility] if your pain continues for further evaluation. Otherwise follow-up with your primary care provider or return here as needed.

## 2024-11-17 ENCOUNTER — Ambulatory Visit
